# Patient Record
Sex: Female | Born: 1965
Health system: Southern US, Community
[De-identification: ages and names within clinical notes are randomized; demographics above are authoritative.]

## PROBLEM LIST (undated history)

## (undated) DIAGNOSIS — I10 Essential (primary) hypertension: Secondary | ICD-10-CM

## (undated) DIAGNOSIS — R112 Nausea with vomiting, unspecified: Secondary | ICD-10-CM

## (undated) DIAGNOSIS — S060X0A Concussion without loss of consciousness, initial encounter: Secondary | ICD-10-CM

## (undated) DIAGNOSIS — K219 Gastro-esophageal reflux disease without esophagitis: Secondary | ICD-10-CM

## (undated) DIAGNOSIS — F418 Other specified anxiety disorders: Secondary | ICD-10-CM

## (undated) DIAGNOSIS — I471 Supraventricular tachycardia, unspecified: Secondary | ICD-10-CM

## (undated) DIAGNOSIS — M1990 Unspecified osteoarthritis, unspecified site: Secondary | ICD-10-CM

## (undated) DIAGNOSIS — B353 Tinea pedis: Secondary | ICD-10-CM

## (undated) DIAGNOSIS — R51 Headache: Secondary | ICD-10-CM

## (undated) DIAGNOSIS — E782 Mixed hyperlipidemia: Secondary | ICD-10-CM

## (undated) DIAGNOSIS — C44310 Basal cell carcinoma of skin of unspecified parts of face: Secondary | ICD-10-CM

## (undated) DIAGNOSIS — Z9889 Other specified postprocedural states: Secondary | ICD-10-CM

## (undated) DIAGNOSIS — E119 Type 2 diabetes mellitus without complications: Secondary | ICD-10-CM

## (undated) DIAGNOSIS — E669 Obesity, unspecified: Secondary | ICD-10-CM

## (undated) DIAGNOSIS — E039 Hypothyroidism, unspecified: Secondary | ICD-10-CM

## (undated) HISTORY — PX: BASAL CELL CARCINOMA EXCISION: SHX1214

## (undated) HISTORY — DX: Tinea pedis: B35.3

## (undated) HISTORY — DX: Hypothyroidism, unspecified: E03.9

## (undated) HISTORY — DX: Basal cell carcinoma of skin of unspecified parts of face: C44.310

## (undated) HISTORY — PX: FINGER SURGERY: SHX640

## (undated) HISTORY — PX: TONSILLECTOMY: SUR1361

## (undated) HISTORY — DX: Concussion without loss of consciousness, initial encounter: S06.0X0A

## (undated) HISTORY — PX: TOTAL HIP ARTHROPLASTY: SHX124

## (undated) HISTORY — DX: Supraventricular tachycardia: I47.1

## (undated) HISTORY — DX: Mixed hyperlipidemia: E78.2

## (undated) HISTORY — DX: Supraventricular tachycardia, unspecified: I47.10

## (undated) HISTORY — DX: Other specified postprocedural states: R11.2

## (undated) HISTORY — DX: Other specified postprocedural states: Z98.890

## (undated) HISTORY — PX: COLONOSCOPY: SHX174

## (undated) HISTORY — PX: UPPER GI ENDOSCOPY: SHX6162

## (undated) HISTORY — DX: Other specified anxiety disorders: F41.8

## (undated) HISTORY — DX: Unspecified osteoarthritis, unspecified site: M19.90

## (undated) HISTORY — PX: HAND SURGERY: SHX662

## (undated) HISTORY — DX: Obesity, unspecified: E66.9

## (undated) HISTORY — PX: POLYPECTOMY: SHX149

## (undated) HISTORY — DX: Type 2 diabetes mellitus without complications: E11.9

---

## 2004-11-02 ENCOUNTER — Emergency Department (HOSPITAL_COMMUNITY): Admission: EM | Admit: 2004-11-02 | Discharge: 2004-11-02 | Payer: Self-pay | Admitting: Emergency Medicine

## 2005-06-04 HISTORY — PX: DILATION AND CURETTAGE OF UTERUS: SHX78

## 2006-10-11 ENCOUNTER — Emergency Department (HOSPITAL_COMMUNITY): Admission: EM | Admit: 2006-10-11 | Discharge: 2006-10-11 | Payer: Self-pay | Admitting: Emergency Medicine

## 2007-01-21 ENCOUNTER — Ambulatory Visit (HOSPITAL_COMMUNITY): Admission: RE | Admit: 2007-01-21 | Discharge: 2007-01-21 | Payer: Self-pay | Admitting: Obstetrics and Gynecology

## 2007-01-21 ENCOUNTER — Encounter (HOSPITAL_COMMUNITY): Payer: Self-pay | Admitting: Obstetrics and Gynecology

## 2008-10-17 ENCOUNTER — Ambulatory Visit: Payer: Self-pay | Admitting: Family Medicine

## 2008-10-17 DIAGNOSIS — R079 Chest pain, unspecified: Secondary | ICD-10-CM | POA: Insufficient documentation

## 2008-10-17 LAB — CONVERTED CEMR LAB
Blood in Urine, dipstick: NEGATIVE
Glucose, Urine, Semiquant: NEGATIVE
Ketones, urine, test strip: NEGATIVE
Nitrite: NEGATIVE
Protein, U semiquant: NEGATIVE
Specific Gravity, Urine: 1.015

## 2009-03-15 ENCOUNTER — Ambulatory Visit (HOSPITAL_BASED_OUTPATIENT_CLINIC_OR_DEPARTMENT_OTHER): Admission: RE | Admit: 2009-03-15 | Discharge: 2009-03-15 | Payer: Self-pay | Admitting: Family Medicine

## 2009-03-15 ENCOUNTER — Ambulatory Visit: Payer: Self-pay | Admitting: Diagnostic Radiology

## 2010-05-05 ENCOUNTER — Emergency Department (HOSPITAL_COMMUNITY)
Admission: EM | Admit: 2010-05-05 | Discharge: 2010-05-05 | Payer: Self-pay | Source: Home / Self Care | Admitting: Emergency Medicine

## 2010-06-04 HISTORY — PX: SHOULDER ARTHROSCOPY: SHX128

## 2010-07-29 ENCOUNTER — Inpatient Hospital Stay (HOSPITAL_COMMUNITY)
Admission: AD | Admit: 2010-07-29 | Discharge: 2010-07-29 | Disposition: A | Discharge status: Home / Self Care | Payer: 59 | Source: Ambulatory Visit | Attending: Obstetrics and Gynecology | Admitting: Obstetrics and Gynecology

## 2010-07-29 DIAGNOSIS — N949 Unspecified condition associated with female genital organs and menstrual cycle: Secondary | ICD-10-CM | POA: Insufficient documentation

## 2010-07-29 DIAGNOSIS — N938 Other specified abnormal uterine and vaginal bleeding: Secondary | ICD-10-CM | POA: Insufficient documentation

## 2010-07-29 LAB — CBC
MCH: 28.8 pg (ref 26.0–34.0)
MCHC: 34.5 g/dL (ref 30.0–36.0)
MCV: 83.5 fL (ref 78.0–100.0)
Platelets: 265 10*3/uL (ref 150–400)
RDW: 12.9 % (ref 11.5–15.5)

## 2010-10-17 NOTE — Op Note (Signed)
[name], [name] NO.:  [id number]   MEDICAL RECORD NO.:  [id number]          PATIENT TYPE:  AMB   LOCATION:  SDC                           FACILITY:  [facility]   PHYSICIAN:  Zelphia Cairo, MD    DATE OF BIRTH:  May 22, 1966   DATE OF PROCEDURE:  01/21/2007  DATE OF DISCHARGE:                               OPERATIVE REPORT   PREOPERATIVE DIAGNOSIS:  Missed AB.   POSTOPERATIVE DIAGNOSIS:  Missed AB.   PROCEDURE:  1. Suction D&E.  2. Cervical block.   SURGEON:  Dr. Renaldo Fiddler   ASSISTANT:  None.   ANESTHESIA:  General and local.   SPECIMEN:  Products of conception.   DISPOSITION:  Pathology.   COMPLICATIONS:  None.   CONDITION:  Stable to recovery room.   PROCEDURE:  The patient was taken to the operating room where general  anesthesia was obtained. She was placed in the dorsal lithotomy position  using Allen stirrups.  She was prepped and draped in sterile fashion and  sterile catheter was used to drain her bladder for approximately 75 mL  of clear urine. A bivalve speculum was placed in the vagina and a single-  tooth tenaculum was placed on the anterior lip of the cervix.  The  cervix was gently dilated using serial Pratt dilators. 7-French suction  catheter was inserted into the uterine cavity and products of conception  were removed.  A curette was then used to ensure that all products of  conception had been removed. Once the uterine cry was noted throughout  the uterus the suction catheter was reinserted to remove any clots and  debris. The cervix was then injected with 1% Nesacaine. Single-tooth  tenaculum was removed from the cervix. There was some  oozing from the anterior cervical site and pressure was applied using a  ring forceps. Once hemostasis was assured.  Bivalve speculum was removed  from the vagina and the patient was taken to the recovery room in stable  condition. Sponge lap, needle and instrument counts were correct x2.  She will be  given RhoGAM in PACU.      Zelphia Cairo, MD  Electronically Signed     GA/MEDQ  D:  01/21/2007  T:  01/21/2007  Job:  [id number]

## 2010-12-27 ENCOUNTER — Other Ambulatory Visit: Payer: Self-pay | Admitting: Obstetrics and Gynecology

## 2010-12-29 ENCOUNTER — Encounter (HOSPITAL_COMMUNITY): Payer: Self-pay

## 2010-12-29 ENCOUNTER — Encounter (HOSPITAL_COMMUNITY)
Admission: RE | Admit: 2010-12-29 | Discharge: 2010-12-29 | Disposition: A | Discharge status: Home / Self Care | Payer: 59 | Source: Ambulatory Visit | Attending: Obstetrics and Gynecology | Admitting: Obstetrics and Gynecology

## 2010-12-29 HISTORY — DX: Gastro-esophageal reflux disease without esophagitis: K21.9

## 2010-12-29 HISTORY — DX: Essential (primary) hypertension: I10

## 2010-12-29 LAB — SURGICAL PCR SCREEN: Staphylococcus aureus: NEGATIVE

## 2010-12-29 LAB — CBC
MCH: 28.6 pg (ref 26.0–34.0)
MCHC: 33.8 g/dL (ref 30.0–36.0)
RBC: 4.65 MIL/uL (ref 3.87–5.11)

## 2010-12-29 NOTE — Anesthesia Preprocedure Evaluation (Addendum)
Anesthesia Evaluation  Name, MR# and DOB Patient awake  General Assessment Comment  Reviewed: Allergy & Precautions, H&P , Patient's Chart, lab work & pertinent test results and reviewed documented beta blocker date and time   History of Anesthesia Complications (+) PONV  Airway Mallampati: I TM Distance: >3 FB Neck ROM: full    Dental No notable dental hx (+) Teeth Intact   Pulmonaryneg pulmonary ROS    clear to auscultation  pulmonary exam normal   Cardiovascular Exercise Tolerance: Good hypertension (just this week has had elev pressures at Dr"s appts), regular Normal   Neuro/Psych (+) {AN ROS/MED HX NEURO HEADACHES (+) PSYCHIATRIC DISORDERS, Depression, Negative Neurological ROS Negative Psych ROS  GI/Hepatic/Renal negative GI ROS, negative Liver ROS, and negative Renal ROS (+)  GERD Medicated and Poorly Controlled     Endo/Other  Negative Endocrine ROS (+)  Hypothyroidism,  Abdominal   Musculoskeletal negative musculoskeletal ROS (+)  Hematology negative hematology ROS (+)   Peds  Reproductive/Obstetrics negative OB ROS   Anesthesia Other Findings Patient to go see PCP for evaluation of high blood pressure - has had diastolics greater than 100 on several occasions this week.  Jasmine December, MD            Anesthesia Physical Anesthesia Plan  ASA: II and Emergent  Anesthesia Plan: General   Post-op Pain Management:    Induction: Rapid sequence, Cricoid pressure planned and Intravenous  Airway Management Planned: Oral ETT  Additional Equipment:   Intra-op Plan:   Post-operative Plan:   Informed Consent: I have reviewed the patients History and Physical, chart, labs and discussed the procedure including the risks, benefits and alternatives for the proposed anesthesia with the patient or authorized representative who has indicated his/her understanding and acceptance.   Dental Advisory Given  Plan  Discussed with: CRNA and Surgeon  Anesthesia Plan Comments:        Anesthesia Quick Evaluation

## 2010-12-29 NOTE — Patient Instructions (Addendum)
[address] Grein Hawks  12/29/2010   Your procedure is scheduled on:  01/03/2011  Report to [facility] at 600 AM.  Call this number if you have problems the morning of surgery: [phone]   Remember:   Do not eat food:After Midnight.  Do not drink clear liquids: After Midnight.  Take these medicines the morning of surgery with A SIP OF WATER: NA   Do not wear jewelry, make-up or nail polish.  Do not bring valuables to the hospital.  Contacts, dentures or bridgework may not be worn into surgery.  Leave suitcase in the car. After surgery it may be brought to your room.  For patients admitted to the hospital, checkout time is 11:00 AM the day of discharge.   Patients discharged the day of surgery will not be allowed to drive home.  Name and phone number of your driver: Janeece Riggers   [phone]  Special Instructions: CHG bath 1/2 bottle the PM before surgery and 1/2bottle AM of surgery Please read over the following fact sheets that you were given: MRSA

## 2011-01-02 MED ORDER — DEXTROSE 5 % IV SOLN
1.0000 g | INTRAVENOUS | Status: DC
  Filled 2011-01-02: qty 1

## 2011-01-02 NOTE — H&P (Addendum)
NAMEJENNYFER, Ebony [name] NO.:  [id number]  MEDICAL RECORD NO.:  [id number]  LOCATION:  SDC                           FACILITY:  [facility]  PHYSICIAN:  Zelphia Cairo, MD    DATE OF BIRTH:  03-31-1966  DATE OF ADMISSION:  12/29/2010 DATE OF DISCHARGE:  12/29/2010                             HISTORY & PHYSICAL   A 45 year old who desires permanent sterilization with a history of menorrhagia presents today for surgical management.  PAST MEDICAL HISTORY:  Hypothyroidism and depression/anxiety.  SURGICAL HISTORY:  Cesarean section.  SOCIAL HISTORY:  Negative for tobacco, alcohol, and drug use.  ALLERGIES:  None.  MEDICATIONS: 1. Pristiq. 2. Synthroid.  FAMILY HISTORY:  Noncontributory.  PHYSICAL EXAMINATION:  VITAL SIGNS:  Stable. GENERAL:  She is in no acute distress. HEART:  Regular rate and rhythm. LUNGS:  Clear bilaterally. ABDOMEN:  Soft, nontender, nondistended.  No rebound or guarding. PELVIC:  Without masses or tenderness.  Pelvic ultrasound was normal. Endometrial biopsy was benign.  ASSESSMENT: 1. Desires permanent sterilization. 2. Menorrhagia.  PLAN: 1. Laparoscopic __________. 2. Hysteroscopy D and C with NovaSure ablation. 3. Risks, benefits, and alternatives to the procedure were discussed     with [name] and informed consent was obtained.   1. Laparoscopic BTL  Zelphia Cairo, MD     GA/MEDQ  D:  01/02/2011  T:  01/02/2011  Job:  [id number]

## 2011-01-03 ENCOUNTER — Other Ambulatory Visit: Payer: Self-pay | Admitting: Obstetrics and Gynecology

## 2011-01-03 ENCOUNTER — Ambulatory Visit (HOSPITAL_COMMUNITY)
Admission: RE | Admit: 2011-01-03 | Discharge: 2011-01-03 | Disposition: A | Discharge status: Home / Self Care | Payer: 59 | Source: Ambulatory Visit | Attending: Obstetrics and Gynecology | Admitting: Obstetrics and Gynecology

## 2011-01-03 ENCOUNTER — Encounter (HOSPITAL_COMMUNITY)
Admission: RE | Disposition: A | Discharge status: Home / Self Care | Payer: Self-pay | Source: Ambulatory Visit | Attending: Obstetrics and Gynecology

## 2011-01-03 ENCOUNTER — Encounter (HOSPITAL_COMMUNITY): Payer: Self-pay | Admitting: Anesthesiology

## 2011-01-03 ENCOUNTER — Ambulatory Visit (HOSPITAL_COMMUNITY): Payer: 59 | Admitting: Anesthesiology

## 2011-01-03 DIAGNOSIS — Z01812 Encounter for preprocedural laboratory examination: Secondary | ICD-10-CM | POA: Insufficient documentation

## 2011-01-03 DIAGNOSIS — Z302 Encounter for sterilization: Secondary | ICD-10-CM | POA: Insufficient documentation

## 2011-01-03 DIAGNOSIS — Z01818 Encounter for other preprocedural examination: Secondary | ICD-10-CM | POA: Insufficient documentation

## 2011-01-03 DIAGNOSIS — N920 Excessive and frequent menstruation with regular cycle: Secondary | ICD-10-CM | POA: Insufficient documentation

## 2011-01-03 HISTORY — PX: LAPAROSCOPIC TUBAL LIGATION: SHX1937

## 2011-01-03 LAB — TYPE AND SCREEN: Antibody Screen: NEGATIVE

## 2011-01-03 SURGERY — LIGATION, FALLOPIAN TUBE, LAPAROSCOPIC
Anesthesia: General | Wound class: Clean

## 2011-01-03 MED ORDER — CITRIC ACID-SODIUM CITRATE 334-500 MG/5ML PO SOLN: 30.0000 mL | Freq: Once | ORAL | Status: DC | PRN

## 2011-01-03 MED ORDER — ACETAMINOPHEN 325 MG PO TABS: 325.0000 mg | ORAL_TABLET | ORAL | Status: DC | PRN

## 2011-01-03 MED ORDER — FENTANYL CITRATE 0.05 MG/ML IJ SOLN
INTRAMUSCULAR | Status: AC
  Filled 2011-01-03: qty 5

## 2011-01-03 MED ORDER — HYDROMORPHONE HCL 1 MG/ML IJ SOLN
0.2500 mg | INTRAMUSCULAR | Status: DC | PRN
  Administered 2011-01-03 (×2): 0.5 mg via INTRAVENOUS

## 2011-01-03 MED ORDER — MEPERIDINE HCL 25 MG/ML IJ SOLN
6.2500 mg | INTRAMUSCULAR | Status: DC | PRN
  Administered 2011-01-03 (×2): 6.25 mg via INTRAVENOUS

## 2011-01-03 MED ORDER — LACTATED RINGERS IV SOLN
INTRAVENOUS | Status: DC | PRN
  Administered 2011-01-03: 3000 mL via INTRAUTERINE

## 2011-01-03 MED ORDER — MIDAZOLAM HCL 2 MG/2ML IJ SOLN
INTRAMUSCULAR | Status: AC
  Filled 2011-01-03: qty 2

## 2011-01-03 MED ORDER — MIDAZOLAM HCL 5 MG/5ML IJ SOLN
INTRAMUSCULAR | Status: DC | PRN
  Administered 2011-01-03: 2 mg via INTRAVENOUS

## 2011-01-03 MED ORDER — ONDANSETRON HCL 4 MG/2ML IJ SOLN
4.0000 mg | Freq: Once | INTRAMUSCULAR | Status: AC
  Administered 2011-01-03: 4 mg via INTRAVENOUS

## 2011-01-03 MED ORDER — MIDAZOLAM HCL 2 MG/2ML IJ SOLN: 0.5000 mg | INTRAMUSCULAR | Status: DC | PRN

## 2011-01-03 MED ORDER — FAMOTIDINE 20 MG PO TABS: 20.0000 mg | ORAL_TABLET | Freq: Once | ORAL | Status: DC | PRN

## 2011-01-03 MED ORDER — ONDANSETRON HCL 4 MG/2ML IJ SOLN
INTRAMUSCULAR | Status: AC
  Administered 2011-01-03: 4 mg via INTRAVENOUS
  Filled 2011-01-03: qty 2

## 2011-01-03 MED ORDER — PROMETHAZINE HCL 25 MG/ML IJ SOLN: 6.2500 mg | INTRAMUSCULAR | Status: DC | PRN

## 2011-01-03 MED ORDER — ACETAMINOPHEN 10 MG/ML IV SOLN: 1000.0000 mg | Freq: Once | INTRAVENOUS | Status: DC | PRN

## 2011-01-03 MED ORDER — ROCURONIUM BROMIDE 100 MG/10ML IV SOLN
INTRAVENOUS | Status: DC | PRN
  Administered 2011-01-03: 40 mg via INTRAVENOUS

## 2011-01-03 MED ORDER — LACTATED RINGERS IV SOLN
INTRAVENOUS | Status: DC
Start: 2011-01-03 — End: 2011-01-03
  Administered 2011-01-03 (×2): via INTRAVENOUS

## 2011-01-03 MED ORDER — PROPOFOL 10 MG/ML IV EMUL
INTRAVENOUS | Status: DC | PRN
  Administered 2011-01-03: 200 mg via INTRAVENOUS

## 2011-01-03 MED ORDER — HYDROCODONE-ACETAMINOPHEN 5-325 MG PO TABS: 2.0000 | ORAL_TABLET | Freq: Once | ORAL | Status: DC

## 2011-01-03 MED ORDER — PANTOPRAZOLE SODIUM 40 MG PO TBEC: 40.0000 mg | DELAYED_RELEASE_TABLET | Freq: Once | ORAL | Status: DC | PRN

## 2011-01-03 MED ORDER — NEOSTIGMINE METHYLSULFATE 1 MG/ML IJ SOLN
INTRAMUSCULAR | Status: DC | PRN
  Administered 2011-01-03: 2 mg via INTRAMUSCULAR

## 2011-01-03 MED ORDER — GLYCOPYRROLATE 0.2 MG/ML IJ SOLN
INTRAMUSCULAR | Status: DC | PRN
  Administered 2011-01-03: 0.2 mg via INTRAVENOUS
  Administered 2011-01-03: .6 mg via INTRAVENOUS

## 2011-01-03 MED ORDER — HYDROMORPHONE HCL 1 MG/ML IJ SOLN
INTRAMUSCULAR | Status: AC
  Administered 2011-01-03: 0.5 mg via INTRAVENOUS
  Filled 2011-01-03: qty 1

## 2011-01-03 MED ORDER — BUPIVACAINE HCL (PF) 0.25 % IJ SOLN
INTRAMUSCULAR | Status: DC | PRN
  Administered 2011-01-03: 5 mL

## 2011-01-03 MED ORDER — ONDANSETRON HCL 4 MG/2ML IJ SOLN
INTRAMUSCULAR | Status: DC | PRN
  Administered 2011-01-03: 4 mg via INTRAVENOUS

## 2011-01-03 MED ORDER — SCOPOLAMINE 1 MG/3DAYS TD PT72
1.0000 | MEDICATED_PATCH | Freq: Once | TRANSDERMAL | Status: DC
  Administered 2011-01-03: 1.5 mg via TRANSDERMAL

## 2011-01-03 MED ORDER — LIDOCAINE HCL (CARDIAC) 20 MG/ML IV SOLN
INTRAVENOUS | Status: DC | PRN
  Administered 2011-01-03: 80 mg via INTRAVENOUS

## 2011-01-03 MED ORDER — SCOPOLAMINE 1 MG/3DAYS TD PT72
MEDICATED_PATCH | TRANSDERMAL | Status: AC
  Administered 2011-01-03: 1.5 mg via TRANSDERMAL
  Filled 2011-01-03: qty 1

## 2011-01-03 MED ORDER — MEPERIDINE HCL 25 MG/ML IJ SOLN
INTRAMUSCULAR | Status: AC
  Administered 2011-01-03: 6.25 mg via INTRAVENOUS
  Filled 2011-01-03: qty 1

## 2011-01-03 MED ORDER — HYDROCODONE-ACETAMINOPHEN 5-500 MG PO TABS: 1.0000 | ORAL_TABLET | Freq: Four times a day (QID) | ORAL | Status: AC | PRN

## 2011-01-03 MED ORDER — FENTANYL CITRATE 0.05 MG/ML IJ SOLN
INTRAMUSCULAR | Status: DC | PRN
  Administered 2011-01-03 (×2): 100 ug via INTRAVENOUS
  Administered 2011-01-03: 50 ug via INTRAVENOUS

## 2011-01-03 MED ORDER — LIDOCAINE HCL 1 % IJ SOLN
INTRAMUSCULAR | Status: DC | PRN
  Administered 2011-01-03: 9 mL

## 2011-01-03 MED ORDER — KETOROLAC TROMETHAMINE 30 MG/ML IJ SOLN
INTRAMUSCULAR | Status: DC | PRN
  Administered 2011-01-03: 30 mg via INTRAVENOUS

## 2011-01-03 MED ORDER — DEXAMETHASONE SODIUM PHOSPHATE 4 MG/ML IJ SOLN
INTRAMUSCULAR | Status: DC | PRN
  Administered 2011-01-03: 10 mg via INTRAVENOUS

## 2011-01-03 SURGICAL SUPPLY — 26 items
ABLATOR ENDOMETRIAL BIPOLAR (ABLATOR) ×3 IMPLANT
CATH ROBINSON RED A/P 16FR (CATHETERS) ×3 IMPLANT
CHLORAPREP W/TINT 26ML (MISCELLANEOUS) ×6 IMPLANT
CLIP FILSHIE TUBAL LIGA STRL (Clip) ×1 IMPLANT
CLOTH BEACON ORANGE TIMEOUT ST (SAFETY) ×3 IMPLANT
CONTAINER PREFILL 10% NBF 60ML (FORM) IMPLANT
DERMABOND ADVANCED (GAUZE/BANDAGES/DRESSINGS) ×4 IMPLANT
GLOVE BIO SURGEON STRL SZ 6.5 (GLOVE) ×3 IMPLANT
GLOVE BIOGEL PI IND STRL 7.0 (GLOVE) ×4 IMPLANT
GLOVE BIOGEL PI INDICATOR 7.0 (GLOVE) ×2
GOWN PREVENTION PLUS LG XLONG (DISPOSABLE) ×6 IMPLANT
NDL SPNL 22GX3.5 QUINCKE BK (NEEDLE) ×2 IMPLANT
NEEDLE SPNL 22GX3.5 QUINCKE BK (NEEDLE) ×3 IMPLANT
PACK HYSTEROSCOPY LF (CUSTOM PROCEDURE TRAY) ×3 IMPLANT
PACK LAPAROSCOPY BASIN (CUSTOM PROCEDURE TRAY) ×3 IMPLANT
SET IRRIG TUBING LAPAROSCOPIC (IRRIGATION / IRRIGATOR) IMPLANT
SLEEVE Z-THREAD 5X100MM (TROCAR) IMPLANT
SUT VIC AB 3-0 PS2 18 (SUTURE) ×3
SUT VIC AB 3-0 PS2 18XBRD (SUTURE) ×2 IMPLANT
SUT VICRYL 0 UR6 27IN ABS (SUTURE) ×3 IMPLANT
SYR CONTROL 10ML LL (SYRINGE) ×3 IMPLANT
TOWEL OR 17X24 6PK STRL BLUE (TOWEL DISPOSABLE) ×6 IMPLANT
TROCAR Z-THREAD BLADED 5X100MM (TROCAR) IMPLANT
TROCAR Z-THREAD FIOS 11X100 BL (TROCAR) ×3 IMPLANT
WARMER LAPAROSCOPE (MISCELLANEOUS) ×3 IMPLANT
WATER STERILE IRR 1000ML POUR (IV SOLUTION) ×3 IMPLANT

## 2011-01-03 NOTE — Anesthesia Postprocedure Evaluation (Signed)
  Anesthesia Post-op Note  Patient: Ebony [name]  Procedure(s) Performed:  LAPAROSCOPIC TUBAL LIGATION - with filshie clips; DILATATION & CURETTAGE/HYSTEROSCOPY WITH NOVASURE ABLATION Patient is awake and responsive. Pain and nausea are reasonably well controlled. Vital signs are stable and clinically acceptable. Oxygen saturation is clinically acceptable. There are no apparent anesthetic complications at this time. Patient is ready for discharge.

## 2011-01-03 NOTE — Transfer of Care (Signed)
Immediate Anesthesia Transfer of Care Note  Patient: Ebony [name]  Procedure(s) Performed:  LAPAROSCOPIC TUBAL LIGATION - with filshie clips; DILATATION & CURETTAGE/HYSTEROSCOPY WITH NOVASURE ABLATION  Patient Location: PACU  Anesthesia Type: General  Level of Consciousness: awake and sedated  Airway & Oxygen Therapy: Patient Spontanous Breathing and Patient connected to face mask oxygen  Post-op Assessment: Report given to PACU RN and Post -op Vital signs reviewed and stable  Post vital signs: Reviewed and stable  Complications: No apparent anesthesia complications

## 2011-01-03 NOTE — Op Note (Signed)
01/03/2011  8:14 AM  PATIENT:  Ebony [name]  45 y.o. female  PRE-OPERATIVE DIAGNOSIS:  menorrhagia, desires sterilization  POST-OPERATIVE DIAGNOSIS:  mennorrhagia  , desires sterilization  PROCEDURE:  Procedure(s): LAPAROSCOPIC TUBAL LIGATION DILATATION & CURETTAGE HYSTEROSCOPY NOVASURE ABLATION CERVICAL BLOCK  SURGEON:  Surgeon(s): Zelphia Cairo  ANESTHESIA:   local and general  ESTIMATED BLOOD LOSS: ,<100CC   LOCAL MEDICATIONS USED:  XYLOCAINE  SPECIMEN: EMC  FLUID DEFICIT:  50CC   DISPOSITION OF SPECIMEN:  PATHOLOGY  COUNTS:  YES  DICTATION #: [id number]  PATIENT DISPOSITION:  PACU - hemodynamically stable.   Delay start of Pharmacological VTE agent (>24hrs) due to surgical blood loss or risk of bleeding:  no

## 2011-01-03 NOTE — Op Note (Signed)
[name], [name]:  [id number]  MEDICAL RECORD NO.:  [id number]  LOCATION:  [facility]                          [facility]ACILITY:  [facility]  PHYSICIAN:  Zelphia Cairo, MD    DATE OF BIRTH:  02/02/1966  DATE OF PROCEDURE:  01/03/2011 DATE OF DISCHARGE:                              OPERATIVE REPORT   PREOPERATIVE DIAGNOSES: 1. Menorrhagia 2. Desires permanent sterilization.  POSTOPERATIVE DIAGNOSES: 1. Menorrhagia 2. Desires permanent sterilization.  PROCEDURES: 1. Laparoscopic bilateral tubal ligation with Filshie clips. 2. Hysteroscopy. 3. D and C. 4. NovaSure ablation. 5. Cervical block.  SURGEON:  Zelphia Cairo, MD  ANESTHESIA:  Local and general.  BLOOD LOSS:  Less than 100 mL.  SPECIMEN:  Endometrial curettings.  FLUID DEFICIT:  50 mL.  COMPLICATIONS:  None.  CONDITION:  Stable to recovery room.  PROCEDURE IN DETAIL:  Marcedes was taken to the operating room after informed consent was obtained.  Time out was performed, and she was prepped and draped in sterile fashion.  An in-and-out catheter was used to drain her bladder for less than 50 mL of clear urine.  Bivalve speculum was placed in the vagina and a single-tooth tenaculum was placed on the anterior lip of the cervix.  Hulka clamp was placed on the cervix.  Tenaculum and speculum were removed and our attention was turned to the abdomen.  A small infraumbilical skin incision was made with a scalpel.  This was extended bluntly to the level of the fascia using a Kelly clamp. Optical trocar was then inserted under direct visualization.  Once intraperitoneal placement was confirmed, CO2 was turned on and the abdomen and pelvis were insufflated.  A survey of the abdomen and pelvis revealed a normal right upper quadrant.  Bilateral ovaries, fallopian tubes appeared normal.  The uterus appeared mildly enlarged.  She did have a small amount of adhesions that were light and filmy in the left lower  quadrant.  Bilateral fallopian tubes were identified and Filshie clips were placed at the isthmic portion of the fallopian tube laparoscopically.  Once this was complete, all instruments and trocars were removed from the abdomen.  A deep stitch was placed in the infraumbilical skin incision using Vicryl and the skin was reapproximated with Dermabond.  Our attention was then turned to the vagina.  Hulka clamp was removed and the bivalve speculum was placed in the vagina.  1 mL of Xylocaine was placed at 12 o'clock and a single-tooth tenaculum was placed on the anterior lip of the cervix.  A cervical block was then performed using 8 mL of local anesthesia.  The uterus measured 8 cm, the cervix measured 4 cm, and the cervix was then serially dilated using Pratt dilators.  Diagnostic hysteroscope was inserted.  Bilateral ostia were visualized and appeared normal.  The uterine cavity was without masses.  Gentle curetting was then performed. Specimen was placed on Telfa and passed off to be sent to Pathology and NovaSure ablation was then performed using standard manufacture guidelines.  Once the cycle was complete, the ablation device was removed from the uterus.  Tenaculum was removed.  The cervix was hemostatic.  Speculum was removed.  The patient was taken  to the recovery room in stable condition.  Sponge, lap, needle, and instrument counts were correct x2.     Zelphia Cairo, MD     GA/MEDQ  D:  01/03/2011  T:  01/03/2011  Job:  [id number]

## 2011-01-04 ENCOUNTER — Encounter (HOSPITAL_COMMUNITY): Payer: Self-pay | Admitting: Obstetrics and Gynecology

## 2011-03-16 LAB — CBC
HCT: 38.4
Hemoglobin: 13.6
MCHC: 35.5
MCV: 84.5
RBC: 4.55
RDW: 12.7

## 2011-03-16 LAB — RH IMMUNE GLOBULIN WORKUP (NOT WOMEN'S HOSP): ABO/RH(D): B NEG

## 2012-02-21 ENCOUNTER — Encounter (HOSPITAL_COMMUNITY): Payer: Self-pay | Admitting: Pharmacist

## 2012-02-26 ENCOUNTER — Other Ambulatory Visit (HOSPITAL_COMMUNITY): Payer: 59

## 2012-02-26 ENCOUNTER — Encounter (HOSPITAL_COMMUNITY)
Admission: RE | Admit: 2012-02-26 | Discharge: 2012-02-26 | Disposition: A | Discharge status: Home / Self Care | Payer: 59 | Source: Ambulatory Visit | Attending: Obstetrics and Gynecology | Admitting: Obstetrics and Gynecology

## 2012-02-26 ENCOUNTER — Encounter (HOSPITAL_COMMUNITY): Payer: Self-pay

## 2012-02-26 HISTORY — DX: Headache: R51

## 2012-02-26 LAB — SURGICAL PCR SCREEN
MRSA, PCR: NEGATIVE
Staphylococcus aureus: NEGATIVE

## 2012-02-26 LAB — CBC
Hemoglobin: 13.4 g/dL (ref 12.0–15.0)
MCH: 28 pg (ref 26.0–34.0)
Platelets: 312 10*3/uL (ref 150–400)
RBC: 4.78 MIL/uL (ref 3.87–5.11)

## 2012-02-26 NOTE — Patient Instructions (Addendum)
   Your procedure is scheduled on: Tuesday, Oct 1st  Enter through the Main Entrance of [facility] at: 6 am Pick up the phone at the desk and dial [phone] and inform us of your arrival.  Please call this number if you have any problems the morning of surgery: [phone]  Remember: Do not eat food after midnight: Monday Do not drink clear liquids after: Monday Take these medicines the morning of surgery with a SIP OF WATER: SYNTHROID  Do not wear jewelry, make-up, or FINGER nail polish No metal in your hair or on your body. Do not wear lotions, powders, perfumes or deodorant. Do not shave 48 hours prior to surgery. Do not bring valuables to the hospital. Contacts, dentures or bridgework may not be worn into surgery.  Leave suitcase in the car. After Surgery it may be brought to your room. For patients being admitted to the hospital, checkout time is 11:00am the day of discharge. Home with husband [name] cell [phone].  Patients discharged on the day of surgery will not be allowed to drive home.     Remember to use your hibiclens as instructed.Please shower with 1/2 bottle the evening before your surgery and the other 1/2 bottle the morning of surgery. Neck down avoiding private area.

## 2012-02-26 NOTE — Pre-Procedure Instructions (Signed)
Spoke with Dr Cristela Blue regarding patient's hx a-fib.  Patient states prior to C/S in 2002 in [place], she had a cardiac work up which showed occasional a-fib.  Patient states a-fib on rare occasion and she just lays down for a little bit when it occurrs.  Patient does not remember where testing was done in [place] since it has been over ten years. No meds taken for a-fib.  EKG done today at pre-op visit which showed Sinus Huston Foley.  Per Dr Jean Rosenthal, document what patient states since we can not obtain past cardiac history.

## 2012-03-03 MED ORDER — DEXTROSE 5 % IV SOLN
2.0000 g | INTRAVENOUS | Status: AC
  Administered 2012-03-04: 2 g via INTRAVENOUS
  Filled 2012-03-03: qty 2

## 2012-03-03 NOTE — H&P (Addendum)
46yo with severe dysmenorrhea presnets for surgical mngt.  Pt is s/p ablation and reports less menstrual bleeding however continues to have dysmenorrhea.  No relief of sx w/ OCPs.  PMHx:  hypothyroidism PSHx:  c-section, BTL, uterine ablation All:  None Meds:  Synthroid, loloestrin, clexa  FHx:  n/c SHx:  No tobacco  AF, VSS Gen - NAD CV - RRR Lungs - clear Abd - soft, nt/nd PV - uterus mobile, NT.  No adnexal masses  AP:  Dysmenorrhea LAVH R/b/a discussed.  Plan of care reviewed.  Informed consent

## 2012-03-04 ENCOUNTER — Encounter (HOSPITAL_COMMUNITY): Payer: Self-pay | Admitting: Anesthesiology

## 2012-03-04 ENCOUNTER — Encounter (HOSPITAL_COMMUNITY): Payer: Self-pay | Admitting: *Deleted

## 2012-03-04 ENCOUNTER — Encounter (HOSPITAL_COMMUNITY)
Admission: RE | Disposition: A | Discharge status: Home / Self Care | Payer: Self-pay | Source: Ambulatory Visit | Attending: Obstetrics and Gynecology

## 2012-03-04 ENCOUNTER — Ambulatory Visit (HOSPITAL_COMMUNITY): Payer: 59 | Admitting: Anesthesiology

## 2012-03-04 ENCOUNTER — Inpatient Hospital Stay (HOSPITAL_COMMUNITY)
Admission: RE | Admit: 2012-03-04 | Discharge: 2012-03-06 | DRG: 743 | Disposition: A | Discharge status: Home / Self Care | Payer: 59 | Source: Ambulatory Visit | Attending: Obstetrics and Gynecology | Admitting: Obstetrics and Gynecology

## 2012-03-04 DIAGNOSIS — Z5331 Laparoscopic surgical procedure converted to open procedure: Secondary | ICD-10-CM

## 2012-03-04 DIAGNOSIS — N946 Dysmenorrhea, unspecified: Principal | ICD-10-CM | POA: Diagnosis present

## 2012-03-04 DIAGNOSIS — D251 Intramural leiomyoma of uterus: Secondary | ICD-10-CM | POA: Diagnosis present

## 2012-03-04 DIAGNOSIS — I498 Other specified cardiac arrhythmias: Secondary | ICD-10-CM | POA: Diagnosis not present

## 2012-03-04 DIAGNOSIS — E039 Hypothyroidism, unspecified: Secondary | ICD-10-CM | POA: Diagnosis present

## 2012-03-04 DIAGNOSIS — N8000 Endometriosis of the uterus, unspecified: Secondary | ICD-10-CM | POA: Diagnosis present

## 2012-03-04 DIAGNOSIS — N72 Inflammatory disease of cervix uteri: Secondary | ICD-10-CM | POA: Diagnosis present

## 2012-03-04 HISTORY — PX: ABDOMINAL HYSTERECTOMY: SHX81

## 2012-03-04 HISTORY — PX: LAPAROSCOPY: SHX197

## 2012-03-04 LAB — CBC
HCT: 33.6 % — ABNORMAL LOW (ref 36.0–46.0)
MCH: 28.6 pg (ref 26.0–34.0)
MCV: 85.9 fL (ref 78.0–100.0)
Platelets: 265 10*3/uL (ref 150–400)
RBC: 3.91 MIL/uL (ref 3.87–5.11)

## 2012-03-04 LAB — TYPE AND SCREEN: Antibody Screen: NEGATIVE

## 2012-03-04 SURGERY — HYSTERECTOMY, ABDOMINAL
Anesthesia: General | Site: Abdomen | Wound class: Clean Contaminated

## 2012-03-04 MED ORDER — ATROPINE SULFATE 0.4 MG/ML IJ SOLN
INTRAMUSCULAR | Status: AC
  Filled 2012-03-04: qty 1

## 2012-03-04 MED ORDER — ATROPINE SULFATE 0.4 MG/ML IJ SOLN
INTRAMUSCULAR | Status: DC | PRN
Start: 2012-03-04 — End: 2012-03-04
  Administered 2012-03-04: 0.4 mg via INTRAVENOUS

## 2012-03-04 MED ORDER — LEVOTHYROXINE SODIUM 50 MCG PO TABS
50.0000 ug | ORAL_TABLET | ORAL | Status: DC
  Administered 2012-03-05 – 2012-03-06 (×2): 50 ug via ORAL
  Filled 2012-03-04 (×4): qty 1

## 2012-03-04 MED ORDER — MIDAZOLAM HCL 2 MG/2ML IJ SOLN
INTRAMUSCULAR | Status: AC
  Filled 2012-03-04: qty 2

## 2012-03-04 MED ORDER — NEOSTIGMINE METHYLSULFATE 1 MG/ML IJ SOLN
INTRAMUSCULAR | Status: DC | PRN
  Administered 2012-03-04: 3 mg via INTRAVENOUS

## 2012-03-04 MED ORDER — HYDROMORPHONE HCL PF 1 MG/ML IJ SOLN
INTRAMUSCULAR | Status: AC
  Filled 2012-03-04: qty 1

## 2012-03-04 MED ORDER — NEOSTIGMINE METHYLSULFATE 1 MG/ML IJ SOLN
INTRAMUSCULAR | Status: AC
  Filled 2012-03-04: qty 10

## 2012-03-04 MED ORDER — BUPIVACAINE HCL (PF) 0.25 % IJ SOLN
INTRAMUSCULAR | Status: AC
  Filled 2012-03-04: qty 30

## 2012-03-04 MED ORDER — LIDOCAINE HCL (CARDIAC) 20 MG/ML IV SOLN
INTRAVENOUS | Status: DC | PRN
  Administered 2012-03-04: 80 mg via INTRAVENOUS

## 2012-03-04 MED ORDER — CEFAZOLIN SODIUM-DEXTROSE 2-3 GM-% IV SOLR
INTRAVENOUS | Status: AC
  Filled 2012-03-04: qty 50

## 2012-03-04 MED ORDER — FAMOTIDINE 20 MG PO TABS
20.0000 mg | ORAL_TABLET | Freq: Two times a day (BID) | ORAL | Status: DC
  Administered 2012-03-04 – 2012-03-06 (×4): 20 mg via ORAL
  Filled 2012-03-04 (×8): qty 1

## 2012-03-04 MED ORDER — DIPHENHYDRAMINE HCL 50 MG/ML IJ SOLN
12.5000 mg | Freq: Four times a day (QID) | INTRAMUSCULAR | Status: DC | PRN
  Administered 2012-03-04 (×2): 12.5 mg via INTRAVENOUS
  Filled 2012-03-04 (×2): qty 1

## 2012-03-04 MED ORDER — METOCLOPRAMIDE HCL 5 MG/ML IJ SOLN: 10.0000 mg | Freq: Once | INTRAMUSCULAR | Status: DC | PRN

## 2012-03-04 MED ORDER — OXYCODONE-ACETAMINOPHEN 5-325 MG PO TABS
1.0000 | ORAL_TABLET | ORAL | Status: DC | PRN
Start: 2012-03-04 — End: 2012-03-05
  Administered 2012-03-05 (×2): 2 via ORAL
  Filled 2012-03-04 (×2): qty 2

## 2012-03-04 MED ORDER — KETOROLAC TROMETHAMINE 30 MG/ML IJ SOLN
30.0000 mg | Freq: Four times a day (QID) | INTRAMUSCULAR | Status: AC
  Administered 2012-03-04 – 2012-03-05 (×3): 30 mg via INTRAVENOUS
  Filled 2012-03-04 (×3): qty 1

## 2012-03-04 MED ORDER — 0.9 % SODIUM CHLORIDE (POUR BTL) OPTIME
TOPICAL | Status: DC | PRN
  Administered 2012-03-04: 1000 mL

## 2012-03-04 MED ORDER — HYDROMORPHONE HCL PF 1 MG/ML IJ SOLN
INTRAMUSCULAR | Status: DC | PRN
  Administered 2012-03-04 (×2): 1 mg via INTRAVENOUS

## 2012-03-04 MED ORDER — PHENYLEPHRINE HCL 10 MG/ML IJ SOLN
INTRAMUSCULAR | Status: DC | PRN
  Administered 2012-03-04: 40 ug via INTRAVENOUS
  Administered 2012-03-04: 80 ug via INTRAVENOUS

## 2012-03-04 MED ORDER — GLYCOPYRROLATE 0.2 MG/ML IJ SOLN
INTRAMUSCULAR | Status: DC | PRN
Start: 2012-03-04 — End: 2012-03-04
  Administered 2012-03-04: 0.4 mg via INTRAVENOUS

## 2012-03-04 MED ORDER — SCOPOLAMINE 1 MG/3DAYS TD PT72
MEDICATED_PATCH | TRANSDERMAL | Status: AC
  Administered 2012-03-04: 1.5 mg via TRANSDERMAL
  Filled 2012-03-04: qty 1

## 2012-03-04 MED ORDER — INFLUENZA VIRUS VACC SPLIT PF IM SUSP
0.5000 mL | INTRAMUSCULAR | Status: AC
  Administered 2012-03-05: 0.5 mL via INTRAMUSCULAR
  Filled 2012-03-04: qty 0.5

## 2012-03-04 MED ORDER — MIDAZOLAM HCL 5 MG/5ML IJ SOLN
INTRAMUSCULAR | Status: DC | PRN
  Administered 2012-03-04: 2 mg via INTRAVENOUS

## 2012-03-04 MED ORDER — LACTATED RINGERS IV SOLN
INTRAVENOUS | Status: DC
  Administered 2012-03-04: 09:00:00 via INTRAVENOUS
  Administered 2012-03-04: 125 mL/h via INTRAVENOUS
  Administered 2012-03-04: 08:00:00 via INTRAVENOUS

## 2012-03-04 MED ORDER — SCOPOLAMINE 1 MG/3DAYS TD PT72
1.0000 | MEDICATED_PATCH | TRANSDERMAL | Status: DC
  Administered 2012-03-04: 1.5 mg via TRANSDERMAL

## 2012-03-04 MED ORDER — NALOXONE HCL 0.4 MG/ML IJ SOLN: 0.4000 mg | INTRAMUSCULAR | Status: DC | PRN

## 2012-03-04 MED ORDER — FENTANYL CITRATE 0.05 MG/ML IJ SOLN
INTRAMUSCULAR | Status: AC
  Filled 2012-03-04: qty 2

## 2012-03-04 MED ORDER — ONDANSETRON HCL 4 MG PO TABS: 4.0000 mg | ORAL_TABLET | Freq: Four times a day (QID) | ORAL | Status: DC | PRN

## 2012-03-04 MED ORDER — SODIUM CHLORIDE 0.9 % IJ SOLN: 9.0000 mL | INTRAMUSCULAR | Status: DC | PRN

## 2012-03-04 MED ORDER — DEXTROSE IN LACTATED RINGERS 5 % IV SOLN
INTRAVENOUS | Status: DC
  Administered 2012-03-04 – 2012-03-05 (×3): via INTRAVENOUS

## 2012-03-04 MED ORDER — MENTHOL 3 MG MT LOZG
1.0000 | LOZENGE | OROMUCOSAL | Status: DC | PRN
  Filled 2012-03-04: qty 9

## 2012-03-04 MED ORDER — BUPIVACAINE HCL (PF) 0.25 % IJ SOLN
INTRAMUSCULAR | Status: DC | PRN
  Administered 2012-03-04: 5 mL

## 2012-03-04 MED ORDER — PROPOFOL 10 MG/ML IV EMUL
INTRAVENOUS | Status: DC | PRN
  Administered 2012-03-04: 200 mg via INTRAVENOUS

## 2012-03-04 MED ORDER — CITALOPRAM HYDROBROMIDE 20 MG PO TABS
20.0000 mg | ORAL_TABLET | Freq: Every day | ORAL | Status: DC
  Administered 2012-03-05 – 2012-03-06 (×2): 20 mg via ORAL
  Filled 2012-03-04 (×4): qty 1

## 2012-03-04 MED ORDER — ROCURONIUM BROMIDE 100 MG/10ML IV SOLN
INTRAVENOUS | Status: DC | PRN
  Administered 2012-03-04: 20 mg via INTRAVENOUS
  Administered 2012-03-04: 50 mg via INTRAVENOUS

## 2012-03-04 MED ORDER — HYDROMORPHONE 0.3 MG/ML IV SOLN
INTRAVENOUS | Status: DC
  Administered 2012-03-04: 11:00:00 via INTRAVENOUS
  Administered 2012-03-04: 2.59 mg via INTRAVENOUS
  Administered 2012-03-04: 0.5999 mg via INTRAVENOUS
  Administered 2012-03-04: 2.59 mg via INTRAVENOUS
  Administered 2012-03-05: 05:00:00 via INTRAVENOUS
  Administered 2012-03-05: 0.4 mg via INTRAVENOUS
  Administered 2012-03-05: 0.399 mg via INTRAVENOUS
  Filled 2012-03-04 (×2): qty 25

## 2012-03-04 MED ORDER — ONDANSETRON HCL 4 MG/2ML IJ SOLN: 4.0000 mg | Freq: Four times a day (QID) | INTRAMUSCULAR | Status: DC | PRN

## 2012-03-04 MED ORDER — MEPERIDINE HCL 25 MG/ML IJ SOLN: 6.2500 mg | INTRAMUSCULAR | Status: DC | PRN

## 2012-03-04 MED ORDER — FENTANYL CITRATE 0.05 MG/ML IJ SOLN
INTRAMUSCULAR | Status: AC
  Filled 2012-03-04: qty 5

## 2012-03-04 MED ORDER — ROCURONIUM BROMIDE 50 MG/5ML IV SOLN
INTRAVENOUS | Status: AC
  Filled 2012-03-04: qty 1

## 2012-03-04 MED ORDER — GLYCOPYRROLATE 0.2 MG/ML IJ SOLN
INTRAMUSCULAR | Status: AC
  Filled 2012-03-04: qty 1

## 2012-03-04 MED ORDER — ONDANSETRON HCL 4 MG/2ML IJ SOLN
INTRAMUSCULAR | Status: AC
  Filled 2012-03-04: qty 2

## 2012-03-04 MED ORDER — DIPHENHYDRAMINE HCL 12.5 MG/5ML PO ELIX
12.5000 mg | ORAL_SOLUTION | Freq: Four times a day (QID) | ORAL | Status: DC | PRN
  Administered 2012-03-05 (×2): 12.5 mg via ORAL
  Filled 2012-03-04 (×3): qty 5

## 2012-03-04 MED ORDER — ONDANSETRON HCL 4 MG/2ML IJ SOLN
INTRAMUSCULAR | Status: DC | PRN
  Administered 2012-03-04: 4 mg via INTRAVENOUS

## 2012-03-04 MED ORDER — DEXAMETHASONE SODIUM PHOSPHATE 10 MG/ML IJ SOLN
INTRAMUSCULAR | Status: DC | PRN
  Administered 2012-03-04: 10 mg via INTRAVENOUS

## 2012-03-04 MED ORDER — FENTANYL CITRATE 0.05 MG/ML IJ SOLN
INTRAMUSCULAR | Status: DC | PRN
  Administered 2012-03-04: 100 ug via INTRAVENOUS
  Administered 2012-03-04: 50 ug via INTRAVENOUS
  Administered 2012-03-04: 150 ug via INTRAVENOUS
  Administered 2012-03-04: 50 ug via INTRAVENOUS

## 2012-03-04 MED ORDER — HYDROMORPHONE HCL PF 1 MG/ML IJ SOLN
0.2500 mg | INTRAMUSCULAR | Status: DC | PRN
  Administered 2012-03-04 (×2): 0.5 mg via INTRAVENOUS
  Administered 2012-03-04: 0.25 mg via INTRAVENOUS

## 2012-03-04 MED ORDER — ONDANSETRON HCL 4 MG/2ML IJ SOLN
4.0000 mg | Freq: Four times a day (QID) | INTRAMUSCULAR | Status: DC | PRN
  Administered 2012-03-04: 4 mg via INTRAVENOUS
  Filled 2012-03-04: qty 2

## 2012-03-04 MED ORDER — PHENYLEPHRINE 40 MCG/ML (10ML) SYRINGE FOR IV PUSH (FOR BLOOD PRESSURE SUPPORT)
PREFILLED_SYRINGE | INTRAVENOUS | Status: AC
  Filled 2012-03-04: qty 5

## 2012-03-04 SURGICAL SUPPLY — 46 items
ADH SKN CLS APL DERMABOND .7 (GAUZE/BANDAGES/DRESSINGS) ×2
CABLE HIGH FREQUENCY MONO STRZ (ELECTRODE) IMPLANT
CANISTER SUCTION 2500CC (MISCELLANEOUS) ×3 IMPLANT
CHLORAPREP W/TINT 26ML (MISCELLANEOUS) ×6 IMPLANT
CLOTH BEACON ORANGE TIMEOUT ST (SAFETY) ×3 IMPLANT
COVER TABLE BACK 60X90 (DRAPES) ×3 IMPLANT
DECANTER SPIKE VIAL GLASS SM (MISCELLANEOUS) IMPLANT
DERMABOND ADVANCED (GAUZE/BANDAGES/DRESSINGS) ×1
DERMABOND ADVANCED .7 DNX12 (GAUZE/BANDAGES/DRESSINGS) ×3 IMPLANT
DRESSING TELFA 8X3 (GAUZE/BANDAGES/DRESSINGS) ×2 IMPLANT
DRSG COVADERM 4X10 (GAUZE/BANDAGES/DRESSINGS) ×2 IMPLANT
ELECT REM PT RETURN 9FT ADLT (ELECTROSURGICAL) ×3
ELECTRODE REM PT RTRN 9FT ADLT (ELECTROSURGICAL) ×1 IMPLANT
GAUZE SPONGE 4X4 12PLY STRL LF (GAUZE/BANDAGES/DRESSINGS) ×2 IMPLANT
GLOVE BIO SURGEON STRL SZ 6.5 (GLOVE) ×6 IMPLANT
GLOVE BIO SURGEON STRL SZ7 (GLOVE) ×4 IMPLANT
GLOVE BIOGEL PI IND STRL 6.5 (GLOVE) ×2 IMPLANT
GLOVE BIOGEL PI IND STRL 7.0 (GLOVE) ×4 IMPLANT
GLOVE BIOGEL PI INDICATOR 6.5 (GLOVE) ×1
GLOVE BIOGEL PI INDICATOR 7.0 (GLOVE) ×2
GOWN PREVENTION PLUS LG XLONG (DISPOSABLE) ×12 IMPLANT
NS IRRIG 1000ML POUR BTL (IV SOLUTION) ×3 IMPLANT
PACK LAVH (CUSTOM PROCEDURE TRAY) ×3 IMPLANT
PROTECTOR NERVE ULNAR (MISCELLANEOUS) ×3 IMPLANT
SCISSORS LAP 5X45 EPIX DISP (ENDOMECHANICALS) ×2 IMPLANT
SEALER TISSUE G2 CVD JAW 45CM (ENDOMECHANICALS) ×3 IMPLANT
SET IRRIG TUBING LAPAROSCOPIC (IRRIGATION / IRRIGATOR) IMPLANT
SLEEVE Z-THREAD 5X100MM (TROCAR) ×4 IMPLANT
SPONGE LAP 18X18 X RAY DECT (DISPOSABLE) ×6 IMPLANT
STAPLER VISISTAT 35W (STAPLE) ×2 IMPLANT
SUT CHROMIC 0 CT 1 (SUTURE) ×4 IMPLANT
SUT MNCRL 0 MO-4 VIOLET 18 CR (SUTURE) ×6 IMPLANT
SUT MON AB 2-0 CT1 36 (SUTURE) ×3 IMPLANT
SUT MON AB-0 CT1 36 (SUTURE) ×2 IMPLANT
SUT MONOCRYL 0 MO 4 18  CR/8 (SUTURE) ×3
SUT PDS AB 0 CTX 60 (SUTURE) ×2 IMPLANT
SUT VIC AB 3-0 PS2 18 (SUTURE) ×3
SUT VIC AB 3-0 PS2 18XBRD (SUTURE) ×2 IMPLANT
SUT VICRYL 0 TIES 12 18 (SUTURE) ×3 IMPLANT
SUT VICRYL 0 UR6 27IN ABS (SUTURE) ×3 IMPLANT
TOWEL OR 17X24 6PK STRL BLUE (TOWEL DISPOSABLE) ×6 IMPLANT
TRAY FOLEY CATH 14FR (SET/KITS/TRAYS/PACK) ×3 IMPLANT
TROCAR Z-THREAD BLADED 5X100MM (TROCAR) ×3 IMPLANT
TROCAR Z-THREAD FIOS 11X100 BL (TROCAR) ×3 IMPLANT
WARMER LAPAROSCOPE (MISCELLANEOUS) ×3 IMPLANT
WATER STERILE IRR 1000ML POUR (IV SOLUTION) ×3 IMPLANT

## 2012-03-04 NOTE — Progress Notes (Signed)
Day of Surgery Procedure(s) (LRB): HYSTERECTOMY ABDOMINAL (N/A) LAPAROSCOPY DIAGNOSTIC (N/A)  Subjective: Patient reports incisional pain.  Pain controlled with PCA.  Mild nausea, no emesis.  No CP or SOB.    Objective: I have reviewed patient's vital signs, intake and output and medications.  General: alert and cooperative GI: normal findings: soft, non-tender Extremities: extremities normal, atraumatic, no cyanosis or edema Vaginal Bleeding: none Incision  - bandage c/d/i.  No BS  Assessment: s/p Procedure(s) (LRB) with comments: HYSTERECTOMY ABDOMINAL (N/A) - with lysis of adhesions LAPAROSCOPY DIAGNOSTIC (N/A) - with lysis of adhesions: stable  Plan: Encourage ambulation Clear liquids Check CBC toradol   LOS: 0 days    Maley Venezia 03/04/2012, 6:23 PM

## 2012-03-04 NOTE — Transfer of Care (Signed)
Immediate Anesthesia Transfer of Care Note  Patient: Ebony [name]  Procedure(s) Performed: Procedure(s) (LRB) with comments: HYSTERECTOMY ABDOMINAL (N/A) - with lysis of adhesions LAPAROSCOPY DIAGNOSTIC (N/A) - with lysis of adhesions  Patient Location: PACU  Anesthesia Type: General  Level of Consciousness: awake, alert  and oriented  Airway & Oxygen Therapy: Patient Spontanous Breathing and Patient connected to nasal cannula oxygen  Post-op Assessment: Report given to PACU RN and Post -op Vital signs reviewed and stable  Post vital signs: stable  Complications: No apparent anesthesia complications

## 2012-03-04 NOTE — Anesthesia Preprocedure Evaluation (Addendum)
Anesthesia Evaluation  Patient identified by MRN, date of birth, ID band Patient awake    Reviewed: Allergy & Precautions, H&P , NPO status , Patient's Chart, lab work & pertinent test results  History of Anesthesia Complications (+) PONV  Airway Mallampati: III TM Distance: >3 FB Neck ROM: Full    Dental No notable dental hx. (+) Teeth Intact   Pulmonary    Pulmonary exam normal       Cardiovascular Atrial Fibrillation Rhythm:Regular Rate:Normal  Hx/o Paroxysmal Atrial Fibrillation last episode 2 weeks ago lasts about 20 min converts spontaneously on no Rx   Neuro/Psych  Headaches, Depression    GI/Hepatic Neg liver ROS, GERD-  Medicated and Controlled,  Endo/Other  Hypothyroidism   Renal/GU negative Renal ROS  negative genitourinary   Musculoskeletal negative musculoskeletal ROS (+)   Abdominal Normal abdominal exam  (+)   Peds  Hematology negative hematology ROS (+)   Anesthesia Other Findings   Reproductive/Obstetrics Dysmenorrhea                          Anesthesia Physical Anesthesia Plan  ASA: II  Anesthesia Plan: General   Post-op Pain Management:    Induction: Intravenous  Airway Management Planned: Oral ETT  Additional Equipment:   Intra-op Plan:   Post-operative Plan: Extubation in OR  Informed Consent: I have reviewed the patients History and Physical, chart, labs and discussed the procedure including the risks, benefits and alternatives for the proposed anesthesia with the patient or authorized representative who has indicated his/her understanding and acceptance.   Dental advisory given  Plan Discussed with: CRNA, Surgeon and Anesthesiologist  Anesthesia Plan Comments:         Anesthesia Quick Evaluation

## 2012-03-04 NOTE — Anesthesia Postprocedure Evaluation (Signed)
  Anesthesia Post-op Note  Patient: Ebony [name]  Procedure(s) Performed: Procedure(s) (LRB) with comments: HYSTERECTOMY ABDOMINAL (N/A) - with lysis of adhesions LAPAROSCOPY DIAGNOSTIC (N/A) - with lysis of adhesions  Patient Location: [facility]  Anesthesia Type: General  Level of Consciousness: awake, alert  and oriented  Airway and Oxygen Therapy: Patient Spontanous Breathing  Post-op Pain: none  Post-op Assessment: Post-op Vital signs reviewed and Patient's Cardiovascular Status Stable  Post-op Vital Signs: Reviewed and stable  Complications: No apparent anesthesia complications

## 2012-03-04 NOTE — Op Note (Signed)
03/04/2012  9:41 AM  PATIENT:  Marline Backbone Grinder  46 y.o. female  PRE-OPERATIVE DIAGNOSIS:  DYSMENORRHEA  POST-OPERATIVE DIAGNOSIS:  dysmenorrhea  Procedure:  Diagnostic laparoscopy with lysis of adhesions TAH Enterolysis  SURGEON:  Surgeon(s) and Role:    * Zelphia Cairo, MD - Primary    * Juluis Mire, MD - Assisting  PHYSICIAN ASSISTANT: J. Mccomb   ANESTHESIA:   general  EBL:  Total I/O In: 3000 [I.V.:3000] Out: 1100 [Urine:300; Blood:800]  BLOOD ADMINISTERED:none  DRAINS: none   LOCAL MEDICATIONS USED:  MARCAINE     SPECIMEN:  Source of Specimen:  uterus and cervix  DISPOSITION OF SPECIMEN:  PATHOLOGY  COUNTS:  YES  TOURNIQUET:  * No tourniquets in log *  DICTATION: .Other Dictation: Dictation Number pending  PLAN OF CARE: Admit to inpatient   PATIENT DISPOSITION:  PACU - hemodynamically stable.   Delay start of Pharmacological VTE agent (>24hrs) due to surgical blood loss or risk of bleeding: no

## 2012-03-04 NOTE — Anesthesia Postprocedure Evaluation (Signed)
Anesthesia Post Note  Patient: Ebony [name]  Procedure(s) Performed: Procedure(s) (LRB): HYSTERECTOMY ABDOMINAL (N/A) LAPAROSCOPY DIAGNOSTIC (N/A)  Anesthesia type: General  Patient location: PACU  Post pain: Pain level controlled  Post assessment: Post-op Vital signs reviewed  Last Vitals:  Filed Vitals:   03/04/12 1122  BP: 143/83  Pulse: 98  Temp: 36.9 C  Resp: 18    Post vital signs: Reviewed  Level of consciousness: sedated  Complications: No apparent anesthesia complications

## 2012-03-04 NOTE — Addendum Note (Signed)
Addendum  created 03/04/12 1731 by Shanon Payor, CRNA   Modules edited:Notes Section

## 2012-03-05 ENCOUNTER — Encounter (HOSPITAL_COMMUNITY): Payer: Self-pay | Admitting: Obstetrics and Gynecology

## 2012-03-05 LAB — CBC
HCT: 28.5 % — ABNORMAL LOW (ref 36.0–46.0)
Hemoglobin: 9.6 g/dL — ABNORMAL LOW (ref 12.0–15.0)
MCH: 28.7 pg (ref 26.0–34.0)
MCHC: 33.7 g/dL (ref 30.0–36.0)
MCV: 85.1 fL (ref 78.0–100.0)
RDW: 13.2 % (ref 11.5–15.5)

## 2012-03-05 MED ORDER — OXYCODONE-ACETAMINOPHEN 5-325 MG PO TABS
1.0000 | ORAL_TABLET | ORAL | Status: DC | PRN
  Administered 2012-03-05 – 2012-03-06 (×3): 2 via ORAL
  Filled 2012-03-05 (×3): qty 2

## 2012-03-05 MED ORDER — HYDROCODONE-ACETAMINOPHEN 5-325 MG PO TABS
1.0000 | ORAL_TABLET | ORAL | Status: DC | PRN
  Administered 2012-03-05: 2 via ORAL
  Filled 2012-03-05: qty 2

## 2012-03-05 MED ORDER — IBUPROFEN 600 MG PO TABS: 600.0000 mg | ORAL_TABLET | Freq: Four times a day (QID) | ORAL | Status: DC | PRN

## 2012-03-05 NOTE — Progress Notes (Signed)
Ur chart review completed.  

## 2012-03-05 NOTE — Progress Notes (Signed)
1 Day Post-Op Procedure(s) (LRB): HYSTERECTOMY ABDOMINAL (N/A) LAPAROSCOPY DIAGNOSTIC (N/A)  Subjective: Patient reports incisional pain and tolerating PO.  No flatus.  No CP/ SOB.    Objective: I have reviewed patient's vital signs, intake and output, medications and labs.  General: alert and cooperative Resp: clear to auscultation bilaterally Cardio: regular rate and rhythm, S1, S2 normal, no murmur, click, rub or gallop GI: soft, non-tender; bowel sounds normal; no masses,  no organomegaly Extremities: extremities normal, atraumatic, no cyanosis or edema Vaginal Bleeding: none  Assessment: s/p Procedure(s) (LRB) with comments: HYSTERECTOMY ABDOMINAL (N/A) - with lysis of adhesions LAPAROSCOPY DIAGNOSTIC (N/A) - with lysis of adhesions: progressing well  Plan: Advance diet Encourage ambulation Advance to PO medication  LOS: 1 day    Ebony [name] 03/05/2012, 8:32 AM

## 2012-03-05 NOTE — Progress Notes (Signed)
1 Day Post-Op Procedure(s) (LRB): HYSTERECTOMY ABDOMINAL (N/A) LAPAROSCOPY DIAGNOSTIC (N/A)  Subjective: Patient reports incisional pain, tolerating PO and no problems voiding.    Objective: I have reviewed patient's vital signs, intake and output and medications.  General: alert and cooperative GI: soft, non-tender; bowel sounds normal; no masses,  no organomegaly Extremities: extremities normal, atraumatic, no cyanosis or edema Vaginal Bleeding: none  Assessment: s/p Procedure(s) (LRB) with comments: HYSTERECTOMY ABDOMINAL (N/A) - with lysis of adhesions LAPAROSCOPY DIAGNOSTIC (N/A) - with lysis of adhesions: progressing well  Plan: Advance diet Encourage ambulation Advance to PO medication Discontinue IV fluids  LOS: 1 day    Ebony [name] 03/05/2012, 8:14 PM

## 2012-03-06 MED ORDER — OXYCODONE-ACETAMINOPHEN 5-325 MG PO TABS: 1.0000 | ORAL_TABLET | ORAL | Status: DC | PRN

## 2012-03-06 MED ORDER — IBUPROFEN 600 MG PO TABS: 600.0000 mg | ORAL_TABLET | Freq: Four times a day (QID) | ORAL | Status: DC | PRN

## 2012-03-06 NOTE — Progress Notes (Signed)
2 Days Post-Op Procedure(s) (LRB): HYSTERECTOMY ABDOMINAL (N/A) LAPAROSCOPY DIAGNOSTIC (N/A)  Subjective: Patient reports tolerating PO and no problems voiding.    Objective: I have reviewed patient's vital signs, intake and output and medications.  General: alert and cooperative GI: soft, non-tender; bowel sounds normal; no masses,  no organomegaly Extremities: extremities normal, atraumatic, no cyanosis or edema Vaginal Bleeding: none  Assessment: s/p Procedure(s) (LRB) with comments: HYSTERECTOMY ABDOMINAL (N/A) - with lysis of adhesions LAPAROSCOPY DIAGNOSTIC (N/A) - with lysis of adhesions: progressing well and tolerating diet  Plan: Discharge home  LOS: 2 days    Ebony [name] 03/06/2012, 8:45 AM

## 2012-03-06 NOTE — Discharge Summary (Signed)
NAMEHINAKO, DEVINCENT NO.:  [id number]  MEDICAL RECORD NO.:  [id number]  LOCATION:  9317                          FACILITY:  [facility]  PHYSICIAN:  Zelphia Cairo, MD    DATE OF BIRTH:  26-Aug-1965  DATE OF ADMISSION:  03/04/2012 DATE OF DISCHARGE:                              DISCHARGE SUMMARY   ADMISSION DIAGNOSIS:  Dysmenorrhea.  PROCEDURES: 1. Diagnostic laparoscopy. 2. Total abdominal hysterectomy.  HOSPITAL COURSE:  The patient was admitted to the hospital postoperatively where her pain was initially controlled with an IV PCA. Foley catheter was in place, and she was given IV fluids for hydration. On postoperative day #1, her hemoglobin was stable at 9.6.  She was able the tolerate a liquid diet.  Her IV was reduced and then eventually discontinued.  Once she was tolerating liquids, her PCA was discontinued and her pain was controlled with an IV PCA.  She was encouraged to ambulate and her Foley catheter was discontinued.  On postoperative day #2, she was ambulating and urinating without difficulty.  She was tolerating a regular diet without nausea and vomiting.  She was felt to be stable for discharge.  She was discharged home with prescriptions for Percocet, Motrin, and instructed to follow up in the office in 1 week for an incision check and staple removal.  Throughout her hospitalization, she remained afebrile and on exam her abdomen was soft, nontender with good bowel sounds and her incision was well-approximated without erythema, induration, or drainage on the day of discharge.     Zelphia Cairo, MD     GA/MEDQ  D:  03/06/2012  T:  03/06/2012  Job:  [id number]

## 2012-03-06 NOTE — Progress Notes (Signed)
Pt is discharged in the care of  Husband. Downstair per . downstairs per ambulatory.Stable.Understands all discharge instructions well Questions were asked and answered.Denies any excessive vAGINAL BLEEDING.oPERATIVE abdominal site is clean and dry.

## 2012-03-12 ENCOUNTER — Encounter (HOSPITAL_COMMUNITY): Payer: Self-pay | Admitting: Anesthesiology

## 2012-03-12 ENCOUNTER — Observation Stay (HOSPITAL_COMMUNITY): Payer: 59

## 2012-03-12 ENCOUNTER — Observation Stay (HOSPITAL_COMMUNITY)
Admission: AD | Admit: 2012-03-12 | Discharge: 2012-03-13 | Disposition: A | Discharge status: Home / Self Care | Payer: 59 | Source: Ambulatory Visit | Attending: Obstetrics and Gynecology | Admitting: Obstetrics and Gynecology

## 2012-03-12 DIAGNOSIS — K929 Disease of digestive system, unspecified: Principal | ICD-10-CM | POA: Insufficient documentation

## 2012-03-12 DIAGNOSIS — K5900 Constipation, unspecified: Secondary | ICD-10-CM | POA: Insufficient documentation

## 2012-03-12 DIAGNOSIS — E039 Hypothyroidism, unspecified: Secondary | ICD-10-CM | POA: Insufficient documentation

## 2012-03-12 DIAGNOSIS — R109 Unspecified abdominal pain: Secondary | ICD-10-CM | POA: Insufficient documentation

## 2012-03-12 DIAGNOSIS — K560 Paralytic ileus: Secondary | ICD-10-CM | POA: Insufficient documentation

## 2012-03-12 LAB — CBC
Hemoglobin: 10.9 g/dL — ABNORMAL LOW (ref 12.0–15.0)
Platelets: 387 10*3/uL (ref 150–400)
RBC: 3.9 MIL/uL (ref 3.87–5.11)

## 2012-03-12 LAB — COMPREHENSIVE METABOLIC PANEL
ALT: 27 U/L (ref 0–35)
AST: 31 U/L (ref 0–37)
Alkaline Phosphatase: 63 U/L (ref 39–117)
CO2: 26 mEq/L (ref 19–32)
GFR calc Af Amer: >90 mL/min (ref >90)
GFR calc non Af Amer: >90 mL/min (ref >90)
Glucose, Bld: 89 mg/dL (ref 70–99)
Potassium: 3.9 mEq/L (ref 3.5–5.1)
Sodium: 138 mEq/L (ref 135–145)
Total Protein: 7.3 g/dL (ref 6.0–8.3)

## 2012-03-12 MED ORDER — OXYCODONE-ACETAMINOPHEN 5-325 MG PO TABS: 1.0000 | ORAL_TABLET | ORAL | Status: DC | PRN

## 2012-03-12 MED ORDER — MENTHOL 3 MG MT LOZG: 1.0000 | LOZENGE | OROMUCOSAL | Status: DC | PRN

## 2012-03-12 MED ORDER — DEXTROSE IN LACTATED RINGERS 5 % IV SOLN
INTRAVENOUS | Status: DC
  Administered 2012-03-12 – 2012-03-13 (×2): via INTRAVENOUS

## 2012-03-12 MED ORDER — IBUPROFEN 600 MG PO TABS
600.0000 mg | ORAL_TABLET | Freq: Four times a day (QID) | ORAL | Status: DC | PRN
  Administered 2012-03-13: 600 mg via ORAL
  Filled 2012-03-12: qty 1

## 2012-03-12 MED ORDER — FAMOTIDINE 20 MG PO TABS
20.0000 mg | ORAL_TABLET | Freq: Every day | ORAL | Status: DC
  Administered 2012-03-12: 20 mg via ORAL
  Filled 2012-03-12: qty 1

## 2012-03-12 MED ORDER — IOHEXOL 300 MG/ML  SOLN
100.0000 mL | Freq: Once | INTRAMUSCULAR | Status: AC | PRN
  Administered 2012-03-12: 100 mL via INTRAVENOUS

## 2012-03-12 MED ORDER — POLYETHYLENE GLYCOL 3350 17 G PO PACK
17.0000 g | PACK | Freq: Two times a day (BID) | ORAL | Status: DC
  Administered 2012-03-13: 17 g via ORAL
  Filled 2012-03-12 (×2): qty 1

## 2012-03-12 MED ORDER — PROMETHAZINE HCL 25 MG/ML IJ SOLN: 25.0000 mg | Freq: Four times a day (QID) | INTRAMUSCULAR | Status: DC | PRN

## 2012-03-12 MED ORDER — DOCUSATE SODIUM 100 MG PO CAPS
100.0000 mg | ORAL_CAPSULE | Freq: Two times a day (BID) | ORAL | Status: DC
  Administered 2012-03-12 – 2012-03-13 (×2): 100 mg via ORAL
  Filled 2012-03-12 (×2): qty 1

## 2012-03-12 NOTE — H&P (Signed)
46 yo 8 days s/p laparoscopy, TAH w/ enterolysis presents for readmission due to severe constipation, nausea and pain.  Pt reports very little flatus and no BM since surgery.  Mild nausea, no emesis.  No f/c.  No back pain.  Incisional pain improving.  PMX:  Hypothyroidism PSH:  c-section, BTL, ablation All:  None Meds:  Synthroid, celexa SHx;  Negative  AF, VSS Gen - NAD CV - RRR Lungs - clear Abd - soft, mild-mod tenderness, ND.  Scant BS Ext - NT  Labs - reviewed CT scan normal, no evidence of GI or GU injury  A/P:  Suspect Ileus Aggressive bowel regimen Fleet enema IVF

## 2012-03-12 NOTE — Progress Notes (Signed)
Notified Md that patient had a very large bm without the need of the enema. Dr Renaldo Fiddler d/c'd enema, start on regular diet.

## 2012-03-13 ENCOUNTER — Encounter (HOSPITAL_COMMUNITY): Payer: Self-pay | Admitting: *Deleted

## 2012-03-13 MED ORDER — OXYCODONE-ACETAMINOPHEN 5-325 MG PO TABS: 1.0000 | ORAL_TABLET | ORAL | Status: DC | PRN

## 2012-03-13 NOTE — Progress Notes (Signed)
UR Chart review completed.  

## 2012-03-13 NOTE — Progress Notes (Signed)
Pt feeling much better this am after BM.  Able to tolerate dinner w/out N/V.  No f/c.  AF, VSS Gen - NAD Abd - soft, NT/ND.  + active BS  A/P:  Discharge home

## 2012-03-13 NOTE — Op Note (Signed)
Ebony [name], Ebony [name] NO.:  [id number]  MEDICAL RECORD NO.:  [id number]  LOCATION:  9317                          FACILITY:  [facility]  PHYSICIAN:  Zelphia Cairo, MD    DATE OF BIRTH:  Nov 12, 1965  DATE OF PROCEDURE:  03/04/2012 DATE OF DISCHARGE:  03/06/2012                              OPERATIVE REPORT   PREOPERATIVE DIAGNOSIS:  Dysmenorrhea.  POSTOPERATIVE DIAGNOSES: 1. Dysmenorrhea. 2. Bowel adhesions.  PROCEDURE: 1. Diagnostic laparoscopy with lysis of adhesions. 2. Total abdominal hysterectomy with enterolysis.  SURGEON:  Zelphia Cairo, MD  ASSISTANT:  Juluis Mire, MD  ANESTHESIA:  General.  ESTIMATED BLOOD LOSS:  300 mL.  SPECIMEN:  Uterus and cervix.  COMPLICATIONS:  None.  CONDITION:  Stable to recovery room.  PROCEDURE:  The patient was taken to the operating room after informed consent was obtained.  She was given general anesthesia and placed in the dorsal lithotomy position using Allen stirrups.  She was prepped and draped in sterile fashion and a Foley catheter was inserted sterilely. Bivalve speculum was placed in the vagina and a single-tooth tenaculum was placed on the anterior lip of the cervix.  An Acorn tenaculum was placed and the speculum was removed and our attention was turned to the abdomen.  Infraumbilical incision was made with the scalpel and this incision was extended bluntly to the level of the fascia using a Kelly clamp. Optical trocar was inserted under direct visualization.  Once intraperitoneal placement was confirmed, CO2 was turned on and a survey of the abdomen and pelvis was performed.  Right upper quadrant appeared normal.  She was noted to have a blanket of adhesions to the left edge of her C-section scar.  Bilateral adnexa appeared normal.  Uterus appeared normal, and 5-mm incision was then made in the right lower quadrant and a 5-mm trocar was inserted.  Blunt probe and blunt graspers were used to tent  the adhesions.  EnSeal device was used to cut and cauterize thin filmy adhesion.  Once thin filmy adhesions had been lysed, it was noted that the bowel was significantly adhered to the incision and so, another 5-mm incision was made in the suprapubic region in her C-section incision.  A 5-mm trocar was inserted under direct visualization.  At this time, significant bleeding was noted from the port site and the decision was made to perform a laparotomy.  All trocars and instruments were removed and a Pfannenstiel skin incision was made using her prior cesarean section scar.  Incision was carried down to the underlying fascia.  The fascia was incised in the midline and extended laterally using curved Mayo scissors.  Kocher clamps were used to grasp the superior and inferior portion of the fascia and the underlying rectus muscles were dissected off.  Peritoneum was identified and entered sharply.  This was extended superiorly and inferiorly.  Bowel adhesions were noted to be significant to the left abdominal wall. Sharp and blunt dissection were used to remove bowel adhesions from the abdominal wall.  The bowel was inspected after dissection and no injury was noted.  The bowel was then packed away using moist lap sponges and an O'Connor-O'Sullivan retractor was placed in  the abdomen.  Bladder blade was placed and the cornua of the uterus were grasped with Kelly clamps.  The round ligaments bilaterally were clamped, transected, and suture ligated.  Good hemostasis was achieved.  Anterior leaf of the broad ligament was then incised along the bladder reflection to the midline from both sides.  The bladder was then gently dissected off of the lower uterine segment and the cervix using a dry sponge.  The utero-ovarian ligaments were then clamped bilaterally, transected, and suture ligated. Hemostasis was visualized.  Uterine arteries were then skeletonized, clamped bilaterally, transected, and  suture ligated.  Hemostasis was assured bilaterally.  Bilateral uterosacral ligaments were then clamped, cut, and suture ligated.  The cervix and uterus were then amputated from the vagina.  The vaginal cuff angles were closed using a figure-of-eight stitch and the remainder of the vaginal cuff was closed using a series of interrupted figure-of-eight sutures.  Hemostasis was assured and the pelvis was copiously irrigated with saline.  All pedicles were reinspected and found to be hemostatic.  All laparotomy sponges and instruments were then removed from the abdomen.  Again, the bowel where adhesions had been present was reinspected and appeared normal.  The peritoneum was then closed with 0 Monocryl, the fascia was closed with looped 0 PDS and the skin was closed with staples.  Sponge, lap, instrument, and needle counts were correct x2.  Laparoscopic incisions were closed with 0 Vicryl and Dermabond was placed over top.  The patient was extubated and taken to the recovery room in stable condition.     Zelphia Cairo, MD     GA/MEDQ  D:  03/12/2012  T:  03/13/2012  Job:  [id number]

## 2012-03-13 NOTE — Discharge Summary (Signed)
Physician Discharge Summary  Patient ID: Ebony [name] MRN: [id number] DOB/AGE: 1965-12-25 46 y.o.  Admit date: 03/12/2012 Discharge date: 03/13/2012  Admission Diagnoses:  Postop ileus  Discharge Diagnoses:  Ileus hypothyroid  Discharged Condition: good  Hospital Course: Pt was admitted with abdominal pain, nausea and inability to have BM or pass flatus.  IVF and aggressive bowel regimen ordered.  Pt was able to have BM and began to feel much better.  Tolerating regular diet and ambulating w/out difficulty.    Consults: None  Significant Diagnostic Studies: labs: CBC, CMET, CT scan  Treatments: IV hydration  Discharge Exam: Blood pressure 115/79, pulse 81, temperature 98.2 F (36.8 C), temperature source Oral, resp. rate 18, height 5\' 5"  (1.651 m), weight 95.766 kg (211 lb 2 oz), last menstrual period 12/15/2010, SpO2 96.00%. see progress note  Disposition: 01-Home or Self Care     Medication List     As of 03/13/2012  7:07 AM    STOP taking these medications         bisacodyl 5 MG EC tablet   Commonly known as: DULCOLAX      TAKE these medications         citalopram 20 MG tablet   Commonly known as: CELEXA   Take 20 mg by mouth daily.      docusate sodium 100 MG capsule   Commonly known as: COLACE   Take 100 mg by mouth 2 (two) times daily.      ibuprofen 600 MG tablet   Commonly known as: ADVIL,MOTRIN   Take 1 tablet (600 mg total) by mouth every 6 (six) hours as needed.      levothyroxine 50 MCG tablet   Commonly known as: SYNTHROID, LEVOTHROID   Take 50 mcg by mouth every morning.      oxyCODONE-acetaminophen 5-325 MG per tablet   Commonly known as: PERCOCET/ROXICET   Take 1-2 tablets by mouth every 4 (four) hours as needed.      oxyCODONE-acetaminophen 5-325 MG per tablet   Commonly known as: PERCOCET/ROXICET   Take 1-2 tablets by mouth every 4 (four) hours as needed (moderate to severe pain (when tolerating fluids)).      polyethylene  glycol packet   Commonly known as: MIRALAX / GLYCOLAX   Take 17 g by mouth daily.      ranitidine 150 MG tablet   Commonly known as: ZANTAC   Take 150 mg by mouth at bedtime.           Follow-up Information    In 1 week to follow up.         Signed: Guilherme Schwenke 03/13/2012, 7:07 AM

## 2012-03-13 NOTE — Progress Notes (Signed)
Pt is discharged in the care of friend. Downstairs per ambulatory. Denies any pain or discomfort. States she had a  Bowel movement this a.m.Marland Kitchen Understands all discharged instructions well. Questions were asked and answered.Stable.

## 2012-06-13 ENCOUNTER — Encounter: Payer: Self-pay | Admitting: Family Medicine

## 2012-06-13 ENCOUNTER — Ambulatory Visit (INDEPENDENT_AMBULATORY_CARE_PROVIDER_SITE_OTHER): Payer: BC Managed Care – PPO | Admitting: Family Medicine

## 2012-06-13 VITALS — BP 132/86 | HR 78 | Temp 99.6°F | Ht 65.0 in | Wt 224.4 lb

## 2012-06-13 DIAGNOSIS — R51 Headache: Secondary | ICD-10-CM

## 2012-06-13 DIAGNOSIS — Z23 Encounter for immunization: Secondary | ICD-10-CM

## 2012-06-13 DIAGNOSIS — G43909 Migraine, unspecified, not intractable, without status migrainosus: Secondary | ICD-10-CM

## 2012-06-13 DIAGNOSIS — C44319 Basal cell carcinoma of skin of other parts of face: Secondary | ICD-10-CM

## 2012-06-13 DIAGNOSIS — F329 Major depressive disorder, single episode, unspecified: Secondary | ICD-10-CM

## 2012-06-13 DIAGNOSIS — Z0000 Encounter for general adult medical examination without abnormal findings: Secondary | ICD-10-CM

## 2012-06-13 DIAGNOSIS — C44310 Basal cell carcinoma of skin of unspecified parts of face: Secondary | ICD-10-CM

## 2012-06-13 DIAGNOSIS — E039 Hypothyroidism, unspecified: Secondary | ICD-10-CM

## 2012-06-13 MED ORDER — TETANUS-DIPHTH-ACELL PERTUSSIS 5-2.5-18.5 LF-MCG/0.5 IM SUSP: 0.5000 mL | Freq: Once | INTRAMUSCULAR | Status: DC

## 2012-06-13 MED ORDER — LEVOTHYROXINE SODIUM 50 MCG PO TABS: 50.0000 ug | ORAL_TABLET | ORAL | Status: DC

## 2012-06-13 MED ORDER — ALMOTRIPTAN MALATE 12.5 MG PO TABS: 12.5000 mg | ORAL_TABLET | ORAL | Status: DC | PRN

## 2012-06-13 MED ORDER — CITALOPRAM HYDROBROMIDE 20 MG PO TABS: 20.0000 mg | ORAL_TABLET | Freq: Every day | ORAL | Status: DC

## 2012-06-13 NOTE — Patient Instructions (Signed)
Preventive Care for Adults, Female A healthy lifestyle and preventive care can promote health and wellness. Preventive health guidelines for women include the following key practices.  A routine yearly physical is a good way to check with your caregiver about your health and preventive screening. It is a chance to share any concerns and updates on your health, and to receive a thorough exam.  Visit your dentist for a routine exam and preventive care every 6 months. Brush your teeth twice a day and floss once a day. Good oral hygiene prevents tooth decay and gum disease.  The frequency of eye exams is based on your age, health, family medical history, use of contact lenses, and other factors. Follow your caregiver's recommendations for frequency of eye exams.  Eat a healthy diet. Foods like vegetables, fruits, whole grains, low-fat dairy products, and lean protein foods contain the nutrients you need without too many calories. Decrease your intake of foods high in solid fats, added sugars, and salt. Eat the right amount of calories for you.Get information about a proper diet from your caregiver, if necessary.  Regular physical exercise is one of the most important things you can do for your health. Most adults should get at least 150 minutes of moderate-intensity exercise (any activity that increases your heart rate and causes you to sweat) each week. In addition, most adults need muscle-strengthening exercises on 2 or more days a week.  Maintain a healthy weight. The body mass index (BMI) is a screening tool to identify possible weight problems. It provides an estimate of body fat based on height and weight. Your caregiver can help determine your BMI, and can help you achieve or maintain a healthy weight.For adults 20 years and older:  A BMI below 18.5 is considered underweight.  A BMI of 18.5 to 24.9 is normal.  A BMI of 25 to 29.9 is considered overweight.  A BMI of 30 and above is  considered obese.  Maintain normal blood lipids and cholesterol levels by exercising and minimizing your intake of saturated fat. Eat a balanced diet with plenty of fruit and vegetables. Blood tests for lipids and cholesterol should begin at age 20 and be repeated every 5 years. If your lipid or cholesterol levels are high, you are over 50, or you are at high risk for heart disease, you may need your cholesterol levels checked more frequently.Ongoing high lipid and cholesterol levels should be treated with medicines if diet and exercise are not effective.  If you smoke, find out from your caregiver how to quit. If you do not use tobacco, do not start.  If you are pregnant, do not drink alcohol. If you are breastfeeding, be very cautious about drinking alcohol. If you are not pregnant and choose to drink alcohol, do not exceed 1 drink per day. One drink is considered to be 12 ounces (355 mL) of beer, 5 ounces (148 mL) of wine, or 1.5 ounces (44 mL) of liquor.  Avoid use of street drugs. Do not share needles with anyone. Ask for help if you need support or instructions about stopping the use of drugs.  High blood pressure causes heart disease and increases the risk of stroke. Your blood pressure should be checked at least every 1 to 2 years. Ongoing high blood pressure should be treated with medicines if weight loss and exercise are not effective.  If you are 55 to 45 years old, ask your caregiver if you should take aspirin to prevent strokes.  Diabetes   screening involves taking a blood sample to check your fasting blood sugar level. This should be done once every 3 years, after age 45, if you are within normal weight and without risk factors for diabetes. Testing should be considered at a younger age or be carried out more frequently if you are overweight and have at least 1 risk factor for diabetes.  Breast cancer screening is essential preventive care for women. You should practice "breast  self-awareness." This means understanding the normal appearance and feel of your breasts and may include breast self-examination. Any changes detected, no matter how small, should be reported to a caregiver. Women in their 20s and 30s should have a clinical breast exam (CBE) by a caregiver as part of a regular health exam every 1 to 3 years. After age 40, women should have a CBE every year. Starting at age 40, women should consider having a mammography (breast X-ray test) every year. Women who have a family history of breast cancer should talk to their caregiver about genetic screening. Women at a high risk of breast cancer should talk to their caregivers about having magnetic resonance imaging (MRI) and a mammography every year.  The Pap test is a screening test for cervical cancer. A Pap test can show cell changes on the cervix that might become cervical cancer if left untreated. A Pap test is a procedure in which cells are obtained and examined from the lower end of the uterus (cervix).  Women should have a Pap test starting at age 21.  Between ages 21 and 29, Pap tests should be repeated every 2 years.  Beginning at age 30, you should have a Pap test every 3 years as long as the past 3 Pap tests have been normal.  Some women have medical problems that increase the chance of getting cervical cancer. Talk to your caregiver about these problems. It is especially important to talk to your caregiver if a new problem develops soon after your last Pap test. In these cases, your caregiver may recommend more frequent screening and Pap tests.  The above recommendations are the same for women who have or have not gotten the vaccine for human papillomavirus (HPV).  If you had a hysterectomy for a problem that was not cancer or a condition that could lead to cancer, then you no longer need Pap tests. Even if you no longer need a Pap test, a regular exam is a good idea to make sure no other problems are  starting.  If you are between ages 65 and 70, and you have had normal Pap tests going back 10 years, you no longer need Pap tests. Even if you no longer need a Pap test, a regular exam is a good idea to make sure no other problems are starting.  If you have had past treatment for cervical cancer or a condition that could lead to cancer, you need Pap tests and screening for cancer for at least 20 years after your treatment.  If Pap tests have been discontinued, risk factors (such as a new sexual partner) need to be reassessed to determine if screening should be resumed.  The HPV test is an additional test that may be used for cervical cancer screening. The HPV test looks for the virus that can cause the cell changes on the cervix. The cells collected during the Pap test can be tested for HPV. The HPV test could be used to screen women aged 30 years and older, and should   be used in women of any age who have unclear Pap test results. After the age of 30, women should have HPV testing at the same frequency as a Pap test.  Colorectal cancer can be detected and often prevented. Most routine colorectal cancer screening begins at the age of 50 and continues through age 75. However, your caregiver may recommend screening at an earlier age if you have risk factors for colon cancer. On a yearly basis, your caregiver may provide home test kits to check for hidden blood in the stool. Use of a small camera at the end of a tube, to directly examine the colon (sigmoidoscopy or colonoscopy), can detect the earliest forms of colorectal cancer. Talk to your caregiver about this at age 50, when routine screening begins. Direct examination of the colon should be repeated every 5 to 10 years through age 75, unless early forms of pre-cancerous polyps or small growths are found.  Hepatitis C blood testing is recommended for all people born from 1945 through 1965 and any individual with known risks for hepatitis C.  Practice  safe sex. Use condoms and avoid high-risk sexual practices to reduce the spread of sexually transmitted infections (STIs). STIs include gonorrhea, chlamydia, syphilis, trichomonas, herpes, HPV, and human immunodeficiency virus (HIV). Herpes, HIV, and HPV are viral illnesses that have no cure. They can result in disability, cancer, and death. Sexually active women aged 25 and younger should be checked for chlamydia. Older women with new or multiple partners should also be tested for chlamydia. Testing for other STIs is recommended if you are sexually active and at increased risk.  Osteoporosis is a disease in which the bones lose minerals and strength with aging. This can result in serious bone fractures. The risk of osteoporosis can be identified using a bone density scan. Women ages 65 and over and women at risk for fractures or osteoporosis should discuss screening with their caregivers. Ask your caregiver whether you should take a calcium supplement or vitamin D to reduce the rate of osteoporosis.  Menopause can be associated with physical symptoms and risks. Hormone replacement therapy is available to decrease symptoms and risks. You should talk to your caregiver about whether hormone replacement therapy is right for you.  Use sunscreen with sun protection factor (SPF) of 30 or more. Apply sunscreen liberally and repeatedly throughout the day. You should seek shade when your shadow is shorter than you. Protect yourself by wearing long sleeves, pants, a wide-brimmed hat, and sunglasses year round, whenever you are outdoors.  Once a month, do a whole body skin exam, using a mirror to look at the skin on your back. Notify your caregiver of new moles, moles that have irregular borders, moles that are larger than a pencil eraser, or moles that have changed in shape or color.  Stay current with required immunizations.  Influenza. You need a dose every fall (or winter). The composition of the flu vaccine  changes each year, so being vaccinated once is not enough.  Pneumococcal polysaccharide. You need 1 to 2 doses if you smoke cigarettes or if you have certain chronic medical conditions. You need 1 dose at age 65 (or older) if you have never been vaccinated.  Tetanus, diphtheria, pertussis (Tdap, Td). Get 1 dose of Tdap vaccine if you are younger than age 65, are over 65 and have contact with an infant, are a healthcare worker, are pregnant, or simply want to be protected from whooping cough. After that, you need a Td   booster dose every 10 years. Consult your caregiver if you have not had at least 3 tetanus and diphtheria-containing shots sometime in your life or have a deep or dirty wound.  HPV. You need this vaccine if you are a woman age 26 or younger. The vaccine is given in 3 doses over 6 months.  Measles, mumps, rubella (MMR). You need at least 1 dose of MMR if you were born in 1957 or later. You may also need a second dose.  Meningococcal. If you are age 19 to 21 and a first-year college student living in a residence hall, or have one of several medical conditions, you need to get vaccinated against meningococcal disease. You may also need additional booster doses.  Zoster (shingles). If you are age 60 or older, you should get this vaccine.  Varicella (chickenpox). If you have never had chickenpox or you were vaccinated but received only 1 dose, talk to your caregiver to find out if you need this vaccine.  Hepatitis A. You need this vaccine if you have a specific risk factor for hepatitis A virus infection or you simply wish to be protected from this disease. The vaccine is usually given as 2 doses, 6 to 18 months apart.  Hepatitis B. You need this vaccine if you have a specific risk factor for hepatitis B virus infection or you simply wish to be protected from this disease. The vaccine is given in 3 doses, usually over 6 months. Preventive Services / Frequency Ages 19 to 39  Blood  pressure check.** / Every 1 to 2 years.  Lipid and cholesterol check.** / Every 5 years beginning at age 20.  Clinical breast exam.** / Every 3 years for women in their 20s and 30s.  Pap test.** / Every 2 years from ages 21 through 29. Every 3 years starting at age 30 through age 65 or 70 with a history of 3 consecutive normal Pap tests.  HPV screening.** / Every 3 years from ages 30 through ages 65 to 70 with a history of 3 consecutive normal Pap tests.  Hepatitis C blood test.** / For any individual with known risks for hepatitis C.  Skin self-exam. / Monthly.  Influenza immunization.** / Every year.  Pneumococcal polysaccharide immunization.** / 1 to 2 doses if you smoke cigarettes or if you have certain chronic medical conditions.  Tetanus, diphtheria, pertussis (Tdap, Td) immunization. / A one-time dose of Tdap vaccine. After that, you need a Td booster dose every 10 years.  HPV immunization. / 3 doses over 6 months, if you are 26 and younger.  Measles, mumps, rubella (MMR) immunization. / You need at least 1 dose of MMR if you were born in 1957 or later. You may also need a second dose.  Meningococcal immunization. / 1 dose if you are age 19 to 21 and a first-year college student living in a residence hall, or have one of several medical conditions, you need to get vaccinated against meningococcal disease. You may also need additional booster doses.  Varicella immunization.** / Consult your caregiver.  Hepatitis A immunization.** / Consult your caregiver. 2 doses, 6 to 18 months apart.  Hepatitis B immunization.** / Consult your caregiver. 3 doses usually over 6 months. Ages 40 to 64  Blood pressure check.** / Every 1 to 2 years.  Lipid and cholesterol check.** / Every 5 years beginning at age 20.  Clinical breast exam.** / Every year after age 40.  Mammogram.** / Every year beginning at age 40   and continuing for as long as you are in good health. Consult with your  caregiver.  Pap test.** / Every 3 years starting at age 30 through age 65 or 70 with a history of 3 consecutive normal Pap tests.  HPV screening.** / Every 3 years from ages 30 through ages 65 to 70 with a history of 3 consecutive normal Pap tests.  Fecal occult blood test (FOBT) of stool. / Every year beginning at age 50 and continuing until age 75. You may not need to do this test if you get a colonoscopy every 10 years.  Flexible sigmoidoscopy or colonoscopy.** / Every 5 years for a flexible sigmoidoscopy or every 10 years for a colonoscopy beginning at age 50 and continuing until age 75.  Hepatitis C blood test.** / For all people born from 1945 through 1965 and any individual with known risks for hepatitis C.  Skin self-exam. / Monthly.  Influenza immunization.** / Every year.  Pneumococcal polysaccharide immunization.** / 1 to 2 doses if you smoke cigarettes or if you have certain chronic medical conditions.  Tetanus, diphtheria, pertussis (Tdap, Td) immunization.** / A one-time dose of Tdap vaccine. After that, you need a Td booster dose every 10 years.  Measles, mumps, rubella (MMR) immunization. / You need at least 1 dose of MMR if you were born in 1957 or later. You may also need a second dose.  Varicella immunization.** / Consult your caregiver.  Meningococcal immunization.** / Consult your caregiver.  Hepatitis A immunization.** / Consult your caregiver. 2 doses, 6 to 18 months apart.  Hepatitis B immunization.** / Consult your caregiver. 3 doses, usually over 6 months. Ages 65 and over  Blood pressure check.** / Every 1 to 2 years.  Lipid and cholesterol check.** / Every 5 years beginning at age 20.  Clinical breast exam.** / Every year after age 40.  Mammogram.** / Every year beginning at age 40 and continuing for as long as you are in good health. Consult with your caregiver.  Pap test.** / Every 3 years starting at age 30 through age 65 or 70 with a 3  consecutive normal Pap tests. Testing can be stopped between 65 and 70 with 3 consecutive normal Pap tests and no abnormal Pap or HPV tests in the past 10 years.  HPV screening.** / Every 3 years from ages 30 through ages 65 or 70 with a history of 3 consecutive normal Pap tests. Testing can be stopped between 65 and 70 with 3 consecutive normal Pap tests and no abnormal Pap or HPV tests in the past 10 years.  Fecal occult blood test (FOBT) of stool. / Every year beginning at age 50 and continuing until age 75. You may not need to do this test if you get a colonoscopy every 10 years.  Flexible sigmoidoscopy or colonoscopy.** / Every 5 years for a flexible sigmoidoscopy or every 10 years for a colonoscopy beginning at age 50 and continuing until age 75.  Hepatitis C blood test.** / For all people born from 1945 through 1965 and any individual with known risks for hepatitis C.  Osteoporosis screening.** / A one-time screening for women ages 65 and over and women at risk for fractures or osteoporosis.  Skin self-exam. / Monthly.  Influenza immunization.** / Every year.  Pneumococcal polysaccharide immunization.** / 1 dose at age 65 (or older) if you have never been vaccinated.  Tetanus, diphtheria, pertussis (Tdap, Td) immunization. / A one-time dose of Tdap vaccine if you are over   65 and have contact with an infant, are a healthcare worker, or simply want to be protected from whooping cough. After that, you need a Td booster dose every 10 years.  Varicella immunization.** / Consult your caregiver.  Meningococcal immunization.** / Consult your caregiver.  Hepatitis A immunization.** / Consult your caregiver. 2 doses, 6 to 18 months apart.  Hepatitis B immunization.** / Check with your caregiver. 3 doses, usually over 6 months. ** Family history and personal history of risk and conditions may change your caregiver's recommendations. Document Released: 07/17/2001 Document Revised: 08/13/2011  Document Reviewed: 10/16/2010 [employer] Patient Information 2013 [employer].  

## 2012-06-15 ENCOUNTER — Encounter: Payer: Self-pay | Admitting: Family Medicine

## 2012-06-15 DIAGNOSIS — E039 Hypothyroidism, unspecified: Secondary | ICD-10-CM | POA: Insufficient documentation

## 2012-06-15 HISTORY — DX: Hypothyroidism, unspecified: E03.9

## 2012-06-15 NOTE — Assessment & Plan Note (Signed)
Ibuprofen works at times, given an rx for Axert to try with Ibuprofen. Failed Relpax, Maxalt and Imitrex. Discussed ways to minimize HA

## 2012-06-15 NOTE — Progress Notes (Signed)
Patient ID: Ebony [name], female   DOB: Mar 03, 1966, 47 y.o.   MRN: [id number] Ebony [name] [id number] 09-26-1965 06/15/2012      Progress Note New Patient  Subjective  Chief Complaint  Chief Complaint  Patient presents with  . Establish Care    new patient  . Injections    TDAP    HPI  Patient is a 47 year old Caucasian female who is in today for new patient appointment. She has a history of hypothyroidism but has not been taking her levothyroxine since she had a partial hysterectomy back in October. She is complaining of fatigue and weight gain. Otherwise she says her health has been relatively good. She's a history of migraines but at this point has roughly 2 year. Sometimes responds to ibuprofen. Trochanter that has not been particularly helpful. She notes when she had her hysterectomy back in October she ended up with a postop ileus but never required surgery. Today she feels well. Denies chest pain, palpitations, shortness of breath, GI or GU concerns at this time  Past Medical History  Diagnosis Date  . Hypothyroidism   . GERD (gastroesophageal reflux disease)   . Depression   . PONV (postoperative nausea and vomiting)   . Hypertension     only with preg. 4yrs ago 2002- no meds  . Dysrhythmia     some occ a-fib, no meds  . Headache     otc meds prn, migraine  . Chicken pox as a child  . Cancer     skin  . Preventative health care 06/13/2012  . BCC (basal cell carcinoma), face     skin   . Hypothyroid 06/15/2012    Past Surgical History  Procedure Date  . Tonsillectomy   . Cesarean section 2002  . Laparoscopic tubal ligation 01/03/2011    Procedure: LAPAROSCOPIC TUBAL LIGATION;  Surgeon: Zelphia Cairo;  Location: [facility];  Service: Gynecology;  Laterality: Bilateral;  with filshie clips  . Tubal ligation   . Abdominal hysterectomy 03/04/2012    Procedure: HYSTERECTOMY ABDOMINAL;  Surgeon: Zelphia Cairo, MD;  Location: [facility];  Service: Gynecology;   Laterality: N/A;  with lysis of adhesions  . Laparoscopy 03/04/2012    Procedure: LAPAROSCOPY DIAGNOSTIC;  Surgeon: Zelphia Cairo, MD;  Location: [facility];  Service: Gynecology;  Laterality: N/A;  with lysis of adhesions    Family History  Problem Relation Age of Onset  . Cancer Mother     lung (06-13-11) breast -10 years ago  . Hypertension Mother   . Depression Mother   . Cancer Brother     skin  . Other Maternal Grandmother     spinal stenosis  . Cancer Maternal Grandfather     prostate  . Emphysema Maternal Grandfather     History   Social History  . Marital Status: Married    Spouse Name: N/A    Number of Children: N/A  . Years of Education: N/A   Occupational History  . Not on file.   Social History Main Topics  . Smoking status: Never Smoker   . Smokeless tobacco: Never Used  . Alcohol Use: Yes     Comment: occasionally   . Drug Use: No  . Sexually Active: Yes    Birth Control/ Protection: Surgical   Other Topics Concern  . Not on file   Social History Narrative  . No narrative on file    Current Outpatient Prescriptions on File Prior to Visit  Medication Sig Dispense Refill  .  citalopram (CELEXA) 20 MG tablet Take 1 tablet (20 mg total) by mouth daily.  30 tablet  2  . ibuprofen (ADVIL,MOTRIN) 600 MG tablet Take 1 tablet (600 mg total) by mouth every 6 (six) hours as needed.  30 tablet  2  . ranitidine (ZANTAC) 150 MG tablet Take 150 mg by mouth at bedtime.        Marland Kitchen almotriptan (AXERT) 12.5 MG tablet Take 1 tablet (12.5 mg total) by mouth as needed for migraine. may repeat in 2 hours if needed  10 tablet  0  . levothyroxine (SYNTHROID, LEVOTHROID) 50 MCG tablet Take 1 tablet (50 mcg total) by mouth every morning.  30 tablet  0   No current facility-administered medications on file prior to visit.    No Known Allergies  Review of Systems  Review of Systems  Constitutional: Negative for fever, chills and malaise/fatigue.  HENT: Negative for hearing  loss, nosebleeds and congestion.   Eyes: Negative for discharge.  Respiratory: Negative for cough, sputum production, shortness of breath and wheezing.   Cardiovascular: Negative for chest pain, palpitations and leg swelling.  Gastrointestinal: Negative for heartburn, nausea, vomiting, abdominal pain, diarrhea, constipation and blood in stool.  Genitourinary: Negative for dysuria, urgency, frequency and hematuria.  Musculoskeletal: Negative for myalgias, back pain and falls.  Skin: Negative for rash.  Neurological: Negative for dizziness, tremors, sensory change, focal weakness, loss of consciousness, weakness and headaches.  Endo/Heme/Allergies: Negative for polydipsia. Does not bruise/bleed easily.  Psychiatric/Behavioral: Negative for depression and suicidal ideas. The patient is not nervous/anxious and does not have insomnia.     Objective  BP 132/86  Pulse 78  Temp 99.6 F (37.6 C) (Temporal)  Ht 5\' 5"  (1.651 m)  Wt 224 lb 6.4 oz (101.787 kg)  BMI 37.34 kg/m2  SpO2 98%  LMP 12/15/2010  Physical Exam  Physical Exam  Constitutional: She is oriented to person, place, and time and well-developed, well-nourished, and in no distress. No distress.  HENT:  Head: Normocephalic and atraumatic.  Right Ear: External ear normal.  Left Ear: External ear normal.  Nose: Nose normal.  Mouth/Throat: Oropharynx is clear and moist. No oropharyngeal exudate.  Eyes: Conjunctivae normal are normal. Pupils are equal, round, and reactive to light. Right eye exhibits no discharge. Left eye exhibits no discharge. No scleral icterus.  Neck: Normal range of motion. Neck supple. No thyromegaly present.  Cardiovascular: Normal rate, regular rhythm, normal heart sounds and intact distal pulses.   No murmur heard. Pulmonary/Chest: Effort normal and breath sounds normal. No respiratory distress. She has no wheezes. She has no rales.  Abdominal: Soft. Bowel sounds are normal. She exhibits no distension and  no mass. There is no tenderness.  Musculoskeletal: Normal range of motion. She exhibits no edema and no tenderness.  Lymphadenopathy:    She has no cervical adenopathy.  Neurological: She is alert and oriented to person, place, and time. She has normal reflexes. No cranial nerve deficit. Coordination normal.  Skin: Skin is warm and dry. No rash noted. She is not diaphoretic.  Psychiatric: Mood, memory and affect normal.       Assessment & Plan  BCC (basal cell carcinoma), face No concerning lesions identified on exam today  Headache Ibuprofen works at times, given an rx for Axert to try with Ibuprofen. Failed Relpax, Maxalt and Imitrex. Discussed ways to minimize HA  Preventative health care Tdap given today  Hypothyroid Has not been taking her Synthroid for the past few months, restarted today,  check fasting labs next week

## 2012-06-15 NOTE — Assessment & Plan Note (Signed)
Has not been taking her Synthroid for the past few months, restarted today, check fasting labs next week

## 2012-06-15 NOTE — Assessment & Plan Note (Signed)
No concerning lesions identified on exam today

## 2012-06-15 NOTE — Assessment & Plan Note (Signed)
Tdap given today.

## 2012-06-23 ENCOUNTER — Other Ambulatory Visit (INDEPENDENT_AMBULATORY_CARE_PROVIDER_SITE_OTHER): Payer: BC Managed Care – PPO

## 2012-06-23 DIAGNOSIS — E039 Hypothyroidism, unspecified: Secondary | ICD-10-CM

## 2012-06-23 DIAGNOSIS — Z0000 Encounter for general adult medical examination without abnormal findings: Secondary | ICD-10-CM

## 2012-06-23 LAB — CBC
HCT: 39.2 % (ref 36.0–46.0)
MCV: 82.4 fl (ref 78.0–100.0)
Platelets: 289 10*3/uL (ref 150.0–400.0)
RBC: 4.76 Mil/uL (ref 3.87–5.11)

## 2012-06-23 NOTE — Progress Notes (Signed)
Labs only

## 2012-06-24 LAB — RENAL FUNCTION PANEL
Albumin: 3.9 g/dL (ref 3.5–5.2)
CO2: 24 mEq/L (ref 19–32)
Calcium: 8.7 mg/dL (ref 8.4–10.5)
Creatinine, Ser: 0.7 mg/dL (ref 0.4–1.2)
Glucose, Bld: 91 mg/dL (ref 70–99)
Potassium: 3.8 mEq/L (ref 3.5–5.1)

## 2012-06-24 LAB — HEPATIC FUNCTION PANEL
ALT: 20 U/L (ref 0–35)
AST: 22 U/L (ref 0–37)
Bilirubin, Direct: 0 mg/dL (ref 0.0–0.3)
Total Protein: 7.4 g/dL (ref 6.0–8.3)

## 2012-06-24 LAB — LIPID PANEL
Cholesterol: 191 mg/dL (ref 0–200)
LDL Cholesterol: 119 mg/dL — ABNORMAL HIGH (ref 0–99)

## 2012-06-24 MED ORDER — ALPRAZOLAM 0.25 MG PO TABS: ORAL_TABLET | ORAL | Status: DC

## 2012-06-24 NOTE — Addendum Note (Signed)
Addended by: Court Joy on: 06/24/2012 05:22 PM   Modules accepted: Orders

## 2012-06-24 NOTE — Progress Notes (Signed)
Quick Note:  Patient Informed and voiced understanding.  Pt also stated that her mom is terminal w/lung cancer and was given only a couple weeks to live. Pt is going back to [place] on Sunday but is having a hard time sleeping. Pt doesn't want something that is going to "knock" her out completely because she needs to hear her mom but would like something to take at night to let her mind relax at night. Please advise? Send RX to [facility] ______

## 2012-07-01 ENCOUNTER — Telehealth: Payer: Self-pay | Admitting: Family Medicine

## 2012-07-01 NOTE — Telephone Encounter (Signed)
Received medical records from [facility] °

## 2012-09-30 ENCOUNTER — Telehealth: Payer: Self-pay | Admitting: Family Medicine

## 2012-09-30 NOTE — Telephone Encounter (Signed)
Please advise 

## 2012-09-30 NOTE — Telephone Encounter (Signed)
Have her continue warm compresses and aleve twice a day and then try and squeeze her in later in the week for evaluation.

## 2012-09-30 NOTE — Telephone Encounter (Signed)
Patient Information:  Caller Name: Doyle  Phone: [phone]  Patient: Ebony [name], Ebony [name]  Gender: Female  DOB: Sep 27, 1965  Age: 47 Years  PCP: Danise [facility])  Pregnant: No  Office Follow Up:  Does the office need to follow up with this patient?: Yes  Instructions For The Office: Pt needs to be een today, 09/30/12 and no appts left.   Symptoms  Reason For Call & Symptoms: Pt had a serious migraine last week and went tot the ED adnd got IV meds in her upper left arm above the [facility] space.  09/29/12 she noticed a hot, red hard area about 2" x 1" inch .  She went back to the ED  and they did an U/S and pt has a blood clot in her left arm in a supserficicial vein.  Was told to use warm compresses, take some Aleve and  and see her PCP.  Reviewed Health History In EMR: Yes  Reviewed Medications In EMR: Yes  Reviewed Allergies In EMR: Yes  Reviewed Surgeries / Procedures: Yes  Date of Onset of Symptoms: 09/29/2012  Treatments Tried: Warm  comprsesses and Aleve  Treatments Tried Worked: No OB / GYN:  LMP: Unknown  Guideline(s) Used:  Skin Injury  Wound Infection  Disposition Per Guideline:   See Today in Office  Reason For Disposition Reached:   Skin redness around the wound larger than 2 inches (5 cm)  Advice Given:  Continue with care advice from the ED   Patient Will Follow Care Advice:  YES

## 2012-10-01 NOTE — Telephone Encounter (Signed)
Pt informed and states it looks a little better today still sore though. appt scheduled

## 2012-10-02 ENCOUNTER — Ambulatory Visit: Payer: BC Managed Care – PPO | Admitting: Family Medicine

## 2012-10-06 ENCOUNTER — Ambulatory Visit (INDEPENDENT_AMBULATORY_CARE_PROVIDER_SITE_OTHER): Payer: BC Managed Care – PPO | Admitting: Family Medicine

## 2012-10-06 ENCOUNTER — Encounter: Payer: Self-pay | Admitting: Family Medicine

## 2012-10-06 VITALS — BP 126/70 | HR 75 | Temp 97.5°F | Ht 65.0 in | Wt 217.1 lb

## 2012-10-06 DIAGNOSIS — F329 Major depressive disorder, single episode, unspecified: Secondary | ICD-10-CM

## 2012-10-06 DIAGNOSIS — R51 Headache: Secondary | ICD-10-CM

## 2012-10-06 DIAGNOSIS — E039 Hypothyroidism, unspecified: Secondary | ICD-10-CM

## 2012-10-06 DIAGNOSIS — F341 Dysthymic disorder: Secondary | ICD-10-CM

## 2012-10-06 DIAGNOSIS — F418 Other specified anxiety disorders: Secondary | ICD-10-CM

## 2012-10-06 HISTORY — DX: Other specified anxiety disorders: F41.8

## 2012-10-06 MED ORDER — LEVOTHYROXINE SODIUM 50 MCG PO TABS: 50.0000 ug | ORAL_TABLET | ORAL | Status: DC

## 2012-10-06 MED ORDER — CITALOPRAM HYDROBROMIDE 20 MG PO TABS: 20.0000 mg | ORAL_TABLET | Freq: Every day | ORAL | Status: DC

## 2012-10-06 NOTE — Progress Notes (Signed)
Patient ID: Ebony [name], female   DOB: 1965/08/11, 47 y.o.   MRN: [id number] Ebony [name] [id number] 04-16-1966 10/06/2012      Progress Note-Follow Up  Subjective  Chief Complaint  Chief Complaint  Patient presents with  . Follow-up    on pain    HPI  Patient is a 47 year old Caucasian female who is in today for followup on blood clot. She had to procedure for migraine and when she got her IV she formed a superficial blood clot. She went back to the ER couple days later with swelling and discomfort in her left upper arm and ultrasound confirm local. Since that time she's been applying moist compresses and it is nearly resolved. History slight swelling and discomfort. No chest pain, palpitations, shortness of breath, GI or GU concerns. She is concerned because her first cousin her mother sisters daughter just was diagnosed with breast cancer at 80 and her mom was diagnosed with breast cancer in her 13s. Patient herself has no breast complaints. No anorexia fevers malaise or myalgias  Past Medical History  Diagnosis Date  . Hypothyroidism   . GERD (gastroesophageal reflux disease)   . Depression   . PONV (postoperative nausea and vomiting)   . Hypertension     only with preg. 29yrs ago 2002- no meds  . Dysrhythmia     some occ a-fib, no meds  . Headache     otc meds prn, migraine  . Chicken pox as a child  . Cancer     skin  . Preventative health care 06/13/2012  . BCC (basal cell carcinoma), face     skin   . Hypothyroid 06/15/2012    Past Surgical History  Procedure Laterality Date  . Tonsillectomy    . Cesarean section  2002  . Laparoscopic tubal ligation  01/03/2011    Procedure: LAPAROSCOPIC TUBAL LIGATION;  Surgeon: Zelphia Cairo;  Location: [facility];  Service: Gynecology;  Laterality: Bilateral;  with filshie clips  . Tubal ligation    . Abdominal hysterectomy  03/04/2012    Procedure: HYSTERECTOMY ABDOMINAL;  Surgeon: Zelphia Cairo, MD;  Location: [facility];   Service: Gynecology;  Laterality: N/A;  with lysis of adhesions  . Laparoscopy  03/04/2012    Procedure: LAPAROSCOPY DIAGNOSTIC;  Surgeon: Zelphia Cairo, MD;  Location: [facility];  Service: Gynecology;  Laterality: N/A;  with lysis of adhesions    Family History  Problem Relation Age of Onset  . Cancer Mother     lung (06-13-11) breast -10 years ago  . Hypertension Mother   . Depression Mother   . Cancer Brother     skin  . Other Maternal Grandmother     spinal stenosis  . Cancer Maternal Grandfather     prostate  . Emphysema Maternal Grandfather     History   Social History  . Marital Status: Married    Spouse Name: N/A    Number of Children: N/A  . Years of Education: N/A   Occupational History  . Not on file.   Social History Main Topics  . Smoking status: Never Smoker   . Smokeless tobacco: Never Used  . Alcohol Use: Yes     Comment: occasionally   . Drug Use: No  . Sexually Active: Yes    Birth Control/ Protection: Surgical   Other Topics Concern  . Not on file   Social History Narrative  . No narrative on file    Current Outpatient Prescriptions on File Prior  to Visit  Medication Sig Dispense Refill  . almotriptan (AXERT) 12.5 MG tablet Take 1 tablet (12.5 mg total) by mouth as needed for migraine. may repeat in 2 hours if needed  10 tablet  0  . ALPRAZolam (XANAX) 0.25 MG tablet 1/2 to 2 tabs po bid prn anxiety and/or insomnia  40 tablet  1  . citalopram (CELEXA) 20 MG tablet Take 1 tablet (20 mg total) by mouth daily.  30 tablet  2  . ibuprofen (ADVIL,MOTRIN) 600 MG tablet Take 1 tablet (600 mg total) by mouth every 6 (six) hours as needed.  30 tablet  2  . levothyroxine (SYNTHROID, LEVOTHROID) 50 MCG tablet Take 1 tablet (50 mcg total) by mouth every morning.  30 tablet  0  . ranitidine (ZANTAC) 150 MG tablet Take 150 mg by mouth at bedtime.         No current facility-administered medications on file prior to visit.    No Known Allergies  Review of  Systems  Review of Systems  Constitutional: Negative for fever and malaise/fatigue.  HENT: Negative for congestion.   Eyes: Negative for discharge.  Respiratory: Negative for shortness of breath.   Cardiovascular: Negative for chest pain, palpitations and leg swelling.  Gastrointestinal: Negative for nausea, abdominal pain and diarrhea.  Genitourinary: Negative for dysuria.  Musculoskeletal: Positive for joint pain. Negative for falls.       Arm pain  Skin: Negative for rash.  Neurological: Negative for loss of consciousness and headaches.  Endo/Heme/Allergies: Negative for polydipsia.  Psychiatric/Behavioral: Negative for depression and suicidal ideas. The patient is nervous/anxious and has insomnia.     Objective  BP 126/70  Pulse 75  Temp(Src) 97.5 F (36.4 C) (Oral)  Ht 5\' 5"  (1.651 m)  Wt 217 lb 1.3 oz (98.467 kg)  BMI 36.12 kg/m2  SpO2 97%  LMP 12/15/2010  Physical Exam  Physical Exam  Constitutional: She is oriented to person, place, and time and well-developed, well-nourished, and in no distress. No distress.  HENT:  Head: Normocephalic and atraumatic.  Eyes: Conjunctivae are normal.  Neck: Neck supple. No thyromegaly present.  Cardiovascular: Normal rate, regular rhythm and normal heart sounds.  Exam reveals no gallop.   No murmur heard. Pulmonary/Chest: Effort normal and breath sounds normal. She has no wheezes.  Abdominal: She exhibits no distension and no mass.  Musculoskeletal: She exhibits no edema.  Lymphadenopathy:    She has no cervical adenopathy.  Neurological: She is alert and oriented to person, place, and time.  Skin: Skin is warm and dry. No rash noted. She is not diaphoretic. There is erythema.  Left arm mildly erythematous  Psychiatric: Memory, affect and judgment normal.    Lab Results  Component Value Date   TSH 7.85* 06/23/2012   Lab Results  Component Value Date   WBC 8.9 06/23/2012   HGB 12.9 06/23/2012   HCT 39.2 06/23/2012   MCV  82.4 06/23/2012   PLT 289.0 06/23/2012   Lab Results  Component Value Date   CREATININE 0.7 06/23/2012   BUN 16 06/23/2012   NA 136 06/23/2012   K 3.8 06/23/2012   CL 104 06/23/2012   CO2 24 06/23/2012   Lab Results  Component Value Date   ALT 20 06/23/2012   AST 22 06/23/2012   ALKPHOS 66 06/23/2012   BILITOT 0.4 06/23/2012   Lab Results  Component Value Date   CHOL 191 06/23/2012   Lab Results  Component Value Date   HDL 38.80* 06/23/2012  Lab Results  Component Value Date   LDLCALC 119* 06/23/2012   Lab Results  Component Value Date   TRIG 168.0* 06/23/2012   Lab Results  Component Value Date   CHOLHDL 5 06/23/2012     Assessment & Plan  Headache Recent Migraine had her end up in the ER, resolved now. Encouraged small, frequent meals with good hydration. Try Excedrin Migraine prn, continue to use Ibuprofen. Has not tried Axert  Hypothyroid Has not taken her Levothyroxine for over a month, will restart and recheck in 10-12 weeks  Depression with anxiety Patient has just lost her mother. Her mom died of small cell lung cancer a couple of months ago. She did relatively well although she still grieving. Her mom was in hospice in [place] and she is encouraged to try to increase support group at hospice here in town. May continue to use alprazolam when necessary although fortunately she is using it infrequently.

## 2012-10-06 NOTE — Patient Instructions (Addendum)
Labs prior to visit lipid, renal, hepatic, cbc, tsh, free T4  Avoid trans fats/partially hydrogenated oils Add Krill oil such as MegaRed caps daily  DASH Diet The DASH diet stands for "Dietary Approaches to Stop Hypertension." It is a healthy eating plan that has been shown to reduce high blood pressure (hypertension) in as little as 14 days, while also possibly providing other significant health benefits. These other health benefits include reducing the risk of breast cancer after menopause and reducing the risk of type 2 diabetes, heart disease, colon cancer, and stroke. Health benefits also include weight loss and slowing kidney failure in patients with chronic kidney disease.  DIET GUIDELINES  Limit salt (sodium). Your diet should contain less than 1500 mg of sodium daily.  Limit refined or processed carbohydrates. Your diet should include mostly whole grains. Desserts and added sugars should be used sparingly.  Include small amounts of heart-healthy fats. These types of fats include nuts, oils, and tub margarine. Limit saturated and trans fats. These fats have been shown to be harmful in the body. CHOOSING FOODS  The following food groups are based on a 2000 calorie diet. See your Registered Dietitian for individual calorie needs. Grains and Grain Products (6 to 8 servings daily)  Eat More Often: Whole-wheat bread, brown rice, whole-grain or wheat pasta, quinoa, popcorn without added fat or salt (air popped).  Eat Less Often: White bread, white pasta, white rice, cornbread. Vegetables (4 to 5 servings daily)  Eat More Often: Fresh, frozen, and canned vegetables. Vegetables may be raw, steamed, roasted, or grilled with a minimal amount of fat.  Eat Less Often/Avoid: Creamed or fried vegetables. Vegetables in a cheese sauce. Fruit (4 to 5 servings daily)  Eat More Often: All fresh, canned (in natural juice), or frozen fruits. Dried fruits without added sugar. One hundred percent fruit  juice ( cup [237 mL] daily).  Eat Less Often: Dried fruits with added sugar. Canned fruit in light or heavy syrup. [employer], Fish, and Poultry (2 servings or less daily. One serving is 3 to 4 oz [85-114 g]).  Eat More Often: Ninety percent or leaner ground beef, tenderloin, sirloin. Round cuts of beef, chicken breast, [place] breast. All fish. Grill, bake, or broil your meat. Nothing should be fried.  Eat Less Often/Avoid: Fatty cuts of meat, [place], or chicken leg, thigh, or wing. Fried cuts of meat or fish. Dairy (2 to 3 servings)  Eat More Often: Low-fat or fat-free milk, low-fat plain or light yogurt, reduced-fat or part-skim cheese.  Eat Less Often/Avoid: Milk (whole, 2%).Whole milk yogurt. Full-fat cheeses. Nuts, Seeds, and Legumes (4 to 5 servings per week)  Eat More Often: All without added salt.  Eat Less Often/Avoid: Salted nuts and seeds, canned beans with added salt. Fats and Sweets (limited)  Eat More Often: Vegetable oils, tub margarines without trans fats, sugar-free gelatin. Mayonnaise and salad dressings.  Eat Less Often/Avoid: Coconut oils, palm oils, butter, stick margarine, cream, half and half, cookies, candy, pie. FOR MORE INFORMATION The Dash Diet Eating Plan: www.dashdiet.org Document Released: 05/10/2011 Document Revised: 08/13/2011 Document Reviewed: 05/10/2011 [facility] Patient Information 2013 [place], [place].    Grief Reaction Grief is a normal response to the death of someone close to you. Feelings of fear, anger, and guilt can affect almost everyone who loses someone they love. Symptoms of depression are also common. These include problems with sleep, loss of appetite, and lack of energy. These grief reaction symptoms often last for weeks to months after  a loss. They may also return during special times that remind you of the person you lost, such as an anniversary or birthday. Anxiety, insomnia, irritability, and deep depression may last beyond the  period of normal grief. If you experience these feelings for 6 months or longer, you may have clinical depression. Clinical depression requires further medical attention. If you think that you have clinical depression, you should contact your caregiver. If you have a history of depression and or a family history of depression, you are at greater risk of clinical depression. You are also at greater risk of developing clinical depression if the loss was traumatic or the loss was of someone with whom you had unresolved issues.  A grief reaction can become complicated by being blocked. This means being unable to cry or express extreme emotions. This may prolong the grieving period and worsen the emotional effects of the loss. Mourning is a natural event in human life. A healthy grief reaction is one that is not blocked . It requires a time of sadness and readjustment.It is very important to share your sorrow and fear with others, especially close friends and family. Professional counselors and clergy can also help you process your grief. Document Released: 05/21/2005 Document Revised: 08/13/2011 Document Reviewed: 01/29/2006 [facility] Patient Information 2013 [place], [place].

## 2012-10-06 NOTE — Assessment & Plan Note (Addendum)
Patient has just lost her mother. Her mom died of small cell lung cancer a couple of months ago. She did relatively well although she still grieving. Her mom was in hospice in [place] and she is encouraged to try to increase support group at hospice here in town. May continue to use alprazolam when necessary although fortunately she is using it infrequently.

## 2012-10-06 NOTE — Assessment & Plan Note (Signed)
Has not taken her Levothyroxine for over a month, will restart and recheck in 10-12 weeks

## 2012-10-06 NOTE — Assessment & Plan Note (Signed)
Recent Migraine had her end up in the ER, resolved now. Encouraged small, frequent meals with good hydration. Try Excedrin Migraine prn, continue to use Ibuprofen. Has not tried Axert

## 2012-10-07 ENCOUNTER — Ambulatory Visit: Payer: BC Managed Care – PPO | Admitting: Family Medicine

## 2012-11-05 ENCOUNTER — Telehealth: Payer: Self-pay | Admitting: Family Medicine

## 2012-11-05 DIAGNOSIS — E039 Hypothyroidism, unspecified: Secondary | ICD-10-CM

## 2012-11-05 DIAGNOSIS — F329 Major depressive disorder, single episode, unspecified: Secondary | ICD-10-CM

## 2012-11-05 MED ORDER — CITALOPRAM HYDROBROMIDE 20 MG PO TABS: 20.0000 mg | ORAL_TABLET | Freq: Every day | ORAL | Status: DC

## 2012-11-05 MED ORDER — LEVOTHYROXINE SODIUM 50 MCG PO TABS: 50.0000 ug | ORAL_TABLET | ORAL | Status: DC

## 2012-11-05 NOTE — Telephone Encounter (Signed)
90 day request  Levothyroxine   citalopram

## 2013-01-05 ENCOUNTER — Telehealth: Payer: Self-pay

## 2013-01-05 DIAGNOSIS — E039 Hypothyroidism, unspecified: Secondary | ICD-10-CM

## 2013-01-05 DIAGNOSIS — Z0000 Encounter for general adult medical examination without abnormal findings: Secondary | ICD-10-CM

## 2013-01-05 LAB — CBC
MCHC: 34.5 g/dL (ref 30.0–36.0)
Platelets: 316 10*3/uL (ref 150–400)
RDW: 14.4 % (ref 11.5–15.5)
WBC: 7.3 10*3/uL (ref 4.0–10.5)

## 2013-01-05 LAB — HEPATIC FUNCTION PANEL
ALT: 22 U/L (ref 0–35)
AST: 19 U/L (ref 0–37)
Albumin: 4 g/dL (ref 3.5–5.2)
Alkaline Phosphatase: 66 U/L (ref 39–117)
Total Protein: 6.8 g/dL (ref 6.0–8.3)

## 2013-01-05 LAB — LIPID PANEL
Cholesterol: 229 mg/dL — ABNORMAL HIGH (ref 0–200)
HDL: 48 mg/dL (ref >39)
LDL Cholesterol: 145 mg/dL — ABNORMAL HIGH (ref 0–99)
Triglycerides: 179 mg/dL — ABNORMAL HIGH (ref <150)

## 2013-01-05 LAB — RENAL FUNCTION PANEL
Albumin: 4 g/dL (ref 3.5–5.2)
BUN: 13 mg/dL (ref 6–23)
CO2: 28 mEq/L (ref 19–32)
Chloride: 104 mEq/L (ref 96–112)
Creat: 0.72 mg/dL (ref 0.50–1.10)
Glucose, Bld: 102 mg/dL — ABNORMAL HIGH (ref 70–99)

## 2013-01-05 LAB — T4, FREE: Free T4: 0.89 ng/dL (ref 0.80–1.80)

## 2013-01-05 NOTE — Telephone Encounter (Signed)
Lab order placed.

## 2013-01-07 ENCOUNTER — Ambulatory Visit: Payer: BC Managed Care – PPO | Admitting: Family Medicine

## 2013-01-08 ENCOUNTER — Ambulatory Visit (INDEPENDENT_AMBULATORY_CARE_PROVIDER_SITE_OTHER): Payer: Managed Care, Other (non HMO) | Admitting: Family Medicine

## 2013-01-08 ENCOUNTER — Encounter: Payer: Self-pay | Admitting: Family Medicine

## 2013-01-08 VITALS — BP 110/78 | HR 72 | Temp 98.0°F | Resp 16 | Ht 65.0 in | Wt 226.0 lb

## 2013-01-08 DIAGNOSIS — M25519 Pain in unspecified shoulder: Secondary | ICD-10-CM

## 2013-01-08 DIAGNOSIS — G43909 Migraine, unspecified, not intractable, without status migrainosus: Secondary | ICD-10-CM

## 2013-01-08 DIAGNOSIS — M25511 Pain in right shoulder: Secondary | ICD-10-CM | POA: Insufficient documentation

## 2013-01-08 DIAGNOSIS — E039 Hypothyroidism, unspecified: Secondary | ICD-10-CM

## 2013-01-08 DIAGNOSIS — F341 Dysthymic disorder: Secondary | ICD-10-CM

## 2013-01-08 DIAGNOSIS — F418 Other specified anxiety disorders: Secondary | ICD-10-CM

## 2013-01-08 DIAGNOSIS — R002 Palpitations: Secondary | ICD-10-CM

## 2013-01-08 DIAGNOSIS — R0602 Shortness of breath: Secondary | ICD-10-CM

## 2013-01-08 DIAGNOSIS — M542 Cervicalgia: Secondary | ICD-10-CM

## 2013-01-08 MED ORDER — CYCLOBENZAPRINE HCL 10 MG PO TABS: 10.0000 mg | ORAL_TABLET | Freq: Two times a day (BID) | ORAL | Status: DC | PRN

## 2013-01-08 MED ORDER — TRAMADOL HCL 50 MG PO TABS: 50.0000 mg | ORAL_TABLET | Freq: Three times a day (TID) | ORAL | Status: DC | PRN

## 2013-01-08 NOTE — Assessment & Plan Note (Signed)
Has an appt with orthopaedics in a few days.

## 2013-01-08 NOTE — Patient Instructions (Signed)
Palpitations  A palpitation is the feeling that your heartbeat is irregular or is faster than normal. It may feel like your heart is fluttering or skipping a beat. Palpitations are usually not a serious problem. However, in some cases, you may need further medical evaluation. CAUSES  Palpitations can be caused by:  Smoking.  Caffeine or other stimulants, such as diet pills or energy drinks.  Alcohol.  Stress and anxiety.  Strenuous physical activity.  Fatigue.  Certain medicines.  Heart disease, especially if you have a history of arrhythmias. This includes atrial fibrillation, atrial flutter, or supraventricular tachycardia.  An improperly working pacemaker or defibrillator. DIAGNOSIS  To find the cause of your palpitations, your caregiver will take your history and perform a physical exam. Tests may also be done, including:  Electrocardiography (ECG). This test records the heart's electrical activity.  Cardiac monitoring. This allows your caregiver to monitor your heart rate and rhythm in real time.  Holter monitor. This is a portable device that records your heartbeat and can help diagnose heart arrhythmias. It allows your caregiver to track your heart activity for several days, if needed.  Stress tests by exercise or by giving medicine that makes the heart beat faster. TREATMENT  Treatment of palpitations depends on the cause of your symptoms and can vary greatly. Most cases of palpitations do not require any treatment other than time, relaxation, and monitoring your symptoms. Other causes, such as atrial fibrillation, atrial flutter, or supraventricular tachycardia, usually require further treatment. HOME CARE INSTRUCTIONS   Avoid:  Caffeinated coffee, tea, soft drinks, diet pills, and energy drinks.  Chocolate.  Alcohol.  Stop smoking if you smoke.  Reduce your stress and anxiety. Things that can help you relax include:  A method that measures bodily functions so  you can learn to control them (biofeedback).  Yoga.  Meditation.  Physical activity such as swimming, jogging, or walking.  Get plenty of rest and sleep. SEEK MEDICAL CARE IF:   You continue to have a fast or irregular heartbeat beyond 24 hours.  Your palpitations occur more often. SEEK IMMEDIATE MEDICAL CARE IF:  You develop chest pain or shortness of breath.  You have a severe headache.  You feel dizzy, or you faint. MAKE SURE YOU:  Understand these instructions.  Will watch your condition.  Will get help right away if you are not doing well or get worse. Document Released: 05/18/2000 Document Revised: 11/20/2011 Document Reviewed: 07/20/2011 Exit[employer] Patient Information 2014 [employer].  

## 2013-01-11 ENCOUNTER — Encounter: Payer: Self-pay | Admitting: Family Medicine

## 2013-01-11 DIAGNOSIS — R002 Palpitations: Secondary | ICD-10-CM | POA: Insufficient documentation

## 2013-01-11 NOTE — Assessment & Plan Note (Signed)
Denies any recent acute or stressful situations. Feels the Citalopram is helping. No changes at this time

## 2013-01-11 NOTE — Assessment & Plan Note (Signed)
Has long history of palpitations usually infrequent and short lived. Recent episode lasted for several hours from 7 am to 3 pm and she felt sob as well. EKG normal today, will refer to cardiology for further consideration and she will report further symptoms

## 2013-01-11 NOTE — Progress Notes (Signed)
Patient ID: SIMRAN BOMKAMP, female   DOB: June 11, 1965, 47 y.o.   MRN: [id number] TAREKA JHAVERI [id number] 14-Jan-1966 01/11/2013      Progress Note-Follow Up  Subjective  Chief Complaint  Chief Complaint  Patient presents with  . Hypothyroidism    Pt here for 3 month follow up.  . Depression  . Shoulder Pain    Pt reports right shoulder and arm pain after incident while walking her dog. Sees orthopedic surgeon next week. Has now started having headaches.  . Palpitations    Pt reports increased episodes of irregular and rapid pulse rate. Recently episode lasted eight hours.    HPI  Patient is a 47 year old Caucasian female in today for followup. She's having significant right shoulder pain which is not resolving. It started with an in-stent with her.while she was watching him and is not improving. No radicular symptoms or weakness. No warmth or redness. She has an appointment with orthopedics within the week. Her bigger concern is palpitations. She's had to come off and on for many years. As a teen she had episodes of palpitations and was initially told she had a bicuspid aortic valve but eventually with further studies with. She had an episode this past week where she was awoken 7 AM palpitations and shortness of breath and while her usual episodes resolved quickly this will last until 3 PM and left her exhausted. She's had a couple of other episodes of awakening with palpitations they're usually short-lived and without associated symptoms. No chest pain or syncope no recent illness or fevers  Past Medical History  Diagnosis Date  . Hypothyroidism   . GERD (gastroesophageal reflux disease)   . Depression   . PONV (postoperative nausea and vomiting)   . Hypertension     only with preg. 18yrs ago 2002- no meds  . Dysrhythmia     some occ a-fib, no meds  . Headache(784.0)     otc meds prn, migraine  . Chicken pox as a child  . Cancer     skin  . Preventative health care  06/13/2012  . BCC (basal cell carcinoma), face     skin   . Hypothyroid 06/15/2012  . Depression with anxiety 10/06/2012  . Right shoulder pain 01/08/2013  . Neck pain 01/08/2013  . Palpitations 01/11/2013    Past Surgical History  Procedure Laterality Date  . Tonsillectomy    . Cesarean section  2002  . Laparoscopic tubal ligation  01/03/2011    Procedure: LAPAROSCOPIC TUBAL LIGATION;  Surgeon: Zelphia Cairo;  Location: [facility];  Service: Gynecology;  Laterality: Bilateral;  with filshie clips  . Tubal ligation    . Abdominal hysterectomy  03/04/2012    Procedure: HYSTERECTOMY ABDOMINAL;  Surgeon: Zelphia Cairo, MD;  Location: [facility];  Service: Gynecology;  Laterality: N/A;  with lysis of adhesions  . Laparoscopy  03/04/2012    Procedure: LAPAROSCOPY DIAGNOSTIC;  Surgeon: Zelphia Cairo, MD;  Location: [facility];  Service: Gynecology;  Laterality: N/A;  with lysis of adhesions    Family History  Problem Relation Age of Onset  . Cancer Mother     lung (06-13-11) breast -10 years ago  . Hypertension Mother   . Depression Mother   . Cancer Brother     skin  . Other Maternal Grandmother     spinal stenosis  . Cancer Maternal Grandfather     prostate  . Emphysema Maternal Grandfather     History   Social History  .  Marital Status: Married    Spouse Name: N/A    Number of Children: N/A  . Years of Education: N/A   Occupational History  . Not on file.   Social History Main Topics  . Smoking status: Never Smoker   . Smokeless tobacco: Never Used  . Alcohol Use: Yes     Comment: occasionally   . Drug Use: No  . Sexually Active: Yes    Birth Control/ Protection: Surgical   Other Topics Concern  . Not on file   Social History Narrative  . No narrative on file    Current Outpatient Prescriptions on File Prior to Visit  Medication Sig Dispense Refill  . almotriptan (AXERT) 12.5 MG tablet Take 1 tablet (12.5 mg total) by mouth as needed for migraine. may repeat in 2 hours if  needed  10 tablet  0  . ALPRAZolam (XANAX) 0.25 MG tablet 1/2 to 2 tabs po bid prn anxiety and/or insomnia  40 tablet  1  . citalopram (CELEXA) 20 MG tablet Take 1 tablet (20 mg total) by mouth daily.  90 tablet  1  . ibuprofen (ADVIL,MOTRIN) 600 MG tablet Take 1 tablet (600 mg total) by mouth every 6 (six) hours as needed.  30 tablet  2  . levothyroxine (SYNTHROID, LEVOTHROID) 50 MCG tablet Take 1 tablet (50 mcg total) by mouth every morning.  90 tablet  1  . ranitidine (ZANTAC) 150 MG tablet Take 150 mg by mouth at bedtime.         No current facility-administered medications on file prior to visit.    No Known Allergies  Review of Systems  Review of Systems  Constitutional: Negative for fever and malaise/fatigue.  HENT: Negative for congestion.   Eyes: Negative for discharge.  Respiratory: Positive for shortness of breath.   Cardiovascular: Positive for chest pain. Negative for palpitations and leg swelling.  Gastrointestinal: Negative for nausea, abdominal pain and diarrhea.  Genitourinary: Negative for dysuria.  Musculoskeletal: Positive for joint pain. Negative for falls.       Right shoulder pain  Skin: Negative for rash.  Neurological: Negative for loss of consciousness and headaches.  Endo/Heme/Allergies: Negative for polydipsia.  Psychiatric/Behavioral: Negative for depression and suicidal ideas. The patient is not nervous/anxious and does not have insomnia.     Objective  BP 110/78  Pulse 72  Temp(Src) 98 F (36.7 C) (Oral)  Resp 16  Ht 5\' 5"  (1.651 m)  Wt 226 lb (102.513 kg)  BMI 37.61 kg/m2  SpO2 97%  LMP 12/15/2010  Physical Exam  Physical Exam  Constitutional: She is oriented to person, place, and time and well-developed, well-nourished, and in no distress. No distress.  HENT:  Head: Normocephalic and atraumatic.  Eyes: Conjunctivae are normal.  Neck: Neck supple. No thyromegaly present.  Cardiovascular: Normal rate and regular rhythm.  Exam reveals  no gallop.   No murmur heard. Pulmonary/Chest: Effort normal and breath sounds normal. She has no wheezes.  Abdominal: She exhibits no distension and no mass.  Musculoskeletal: She exhibits no edema.  Lymphadenopathy:    She has no cervical adenopathy.  Neurological: She is alert and oriented to person, place, and time.  Skin: Skin is warm and dry. No rash noted. She is not diaphoretic.  Psychiatric: Memory, affect and judgment normal.    Lab Results  Component Value Date   TSH 4.260 01/05/2013   Lab Results  Component Value Date   WBC 7.3 01/05/2013   HGB 12.9 01/05/2013  HCT 37.4 01/05/2013   MCV 80.1 01/05/2013   PLT 316 01/05/2013   Lab Results  Component Value Date   CREATININE 0.72 01/05/2013   BUN 13 01/05/2013   NA 138 01/05/2013   K 4.3 01/05/2013   CL 104 01/05/2013   CO2 28 01/05/2013   Lab Results  Component Value Date   ALT 22 01/05/2013   AST 19 01/05/2013   ALKPHOS 66 01/05/2013   BILITOT 0.3 01/05/2013   Lab Results  Component Value Date   CHOL 229* 01/05/2013   Lab Results  Component Value Date   HDL 48 01/05/2013   Lab Results  Component Value Date   LDLCALC 145* 01/05/2013   Lab Results  Component Value Date   TRIG 179* 01/05/2013   Lab Results  Component Value Date   CHOLHDL 4.8 01/05/2013     Assessment & Plan  Right shoulder pain Has an appt with orthopaedics in a few days.  Hypothyroid Thyroid studies WNL, no changes  Palpitations Has long history of palpitations usually infrequent and short lived. Recent episode lasted for several hours from 7 am to 3 pm and she felt sob as well. EKG normal today, will refer to cardiology for further consideration and she will report further symptoms  Depression with anxiety Denies any recent acute or stressful situations. Feels the Citalopram is helping. No changes at this time

## 2013-01-11 NOTE — Assessment & Plan Note (Signed)
Thyroid studies WNL, no changes

## 2013-01-21 ENCOUNTER — Ambulatory Visit (INDEPENDENT_AMBULATORY_CARE_PROVIDER_SITE_OTHER): Payer: Managed Care, Other (non HMO) | Admitting: Cardiology

## 2013-01-21 ENCOUNTER — Encounter: Payer: Self-pay | Admitting: Cardiology

## 2013-01-21 VITALS — BP 120/86 | HR 75 | Ht 65.0 in | Wt 224.0 lb

## 2013-01-21 DIAGNOSIS — R0609 Other forms of dyspnea: Secondary | ICD-10-CM

## 2013-01-21 DIAGNOSIS — R002 Palpitations: Secondary | ICD-10-CM

## 2013-01-21 DIAGNOSIS — R0600 Dyspnea, unspecified: Secondary | ICD-10-CM

## 2013-01-21 NOTE — Progress Notes (Signed)
HPI: 47 year old female for evaluation of palpitations and dyspnea. Laboratories in August of 2014 showed a normal potassium of 4.3, BUN and creatinine 13/0.72, normal liver functions, hemoglobin 12.9, and TSH 4.260. Patient states that she has had intermittent palpitations since she was a teenager. They are sudden in onset and described as her heart racing. They're not related to activity. There is associated dyspnea but no chest pain or syncope. Her episodes have become more frequent and recently she has had 2 episodes that are more prolonged. She otherwise does not have dyspnea on exertion, orthopnea, PND, pedal edema or exertional chest pain. Because of the above we were asked to evaluate. Also note she was told she had a bicuspid aortic valve in the past. However followup with a cardiologist at the time of her pregnancy and repeat echocardiogram did not demonstrate this.  Current Outpatient Prescriptions  Medication Sig Dispense Refill  . almotriptan (AXERT) 12.5 MG tablet Take 1 tablet (12.5 mg total) by mouth as needed for migraine. may repeat in 2 hours if needed  10 tablet  0  . ALPRAZolam (XANAX) 0.25 MG tablet 1/2 to 2 tabs po bid prn anxiety and/or insomnia  40 tablet  1  . citalopram (CELEXA) 20 MG tablet Take 1 tablet (20 mg total) by mouth daily.  90 tablet  1  . cyclobenzaprine (FLEXERIL) 10 MG tablet Take 1 tablet (10 mg total) by mouth 2 (two) times daily as needed for muscle spasms (can have 1/2 tab during the day).  40 tablet  1  . ibuprofen (ADVIL,MOTRIN) 600 MG tablet Take 1 tablet (600 mg total) by mouth every 6 (six) hours as needed.  30 tablet  2  . levothyroxine (SYNTHROID, LEVOTHROID) 50 MCG tablet Take 1 tablet (50 mcg total) by mouth every morning.  90 tablet  1  . ranitidine (ZANTAC) 150 MG tablet Take 150 mg by mouth at bedtime.        . traMADol (ULTRAM) 50 MG tablet Take 1 tablet (50 mg total) by mouth every 8 (eight) hours as needed for pain.  40 tablet  1   No  current facility-administered medications for this visit.    No Known Allergies  Past Medical History  Diagnosis Date  . GERD (gastroesophageal reflux disease)   . Hypertension     only with preg. 62yrs ago 2002- no meds  . Headache(784.0)     otc meds prn, migraine  . Chicken pox as a child  . Preventative health care 06/13/2012  . BCC (basal cell carcinoma), face     skin   . Hypothyroid 06/15/2012  . Depression with anxiety 10/06/2012  . Hyperlipidemia     Past Surgical History  Procedure Laterality Date  . Tonsillectomy    . Cesarean section  2002  . Laparoscopic tubal ligation  01/03/2011    Procedure: LAPAROSCOPIC TUBAL LIGATION;  Surgeon: Zelphia Cairo;  Location: [facility];  Service: Gynecology;  Laterality: Bilateral;  with filshie clips  . Abdominal hysterectomy  03/04/2012    Procedure: HYSTERECTOMY ABDOMINAL;  Surgeon: Zelphia Cairo, MD;  Location: [facility];  Service: Gynecology;  Laterality: N/A;  with lysis of adhesions  . Laparoscopy  03/04/2012    Procedure: LAPAROSCOPY DIAGNOSTIC;  Surgeon: Zelphia Cairo, MD;  Location: [facility];  Service: Gynecology;  Laterality: N/A;  with lysis of adhesions    History   Social History  . Marital Status: Married    Spouse Name: N/A    Number of Children: 1  .  Years of Education: N/A   Occupational History  .      House wife   Social History Main Topics  . Smoking status: Never Smoker   . Smokeless tobacco: Never Used  . Alcohol Use: Yes     Comment: occasionally   . Drug Use: No  . Sexual Activity: Yes    Birth Control/ Protection: Surgical   Other Topics Concern  . Not on file   Social History Narrative  . No narrative on file    Family History  Problem Relation Age of Onset  . Cancer Mother     lung (06-13-11) breast -10 years ago  . Hypertension Mother   . Depression Mother   . Cancer Brother     skin  . Other Maternal Grandmother     spinal stenosis  . Cancer Maternal Grandfather     prostate  .  Emphysema Maternal Grandfather     ROS: no fevers or chills, productive cough, hemoptysis, dysphasia, odynophagia, melena, hematochezia, dysuria, hematuria, rash, seizure activity, orthopnea, PND, pedal edema, claudication. Remaining systems are negative.  Physical Exam:   Blood pressure 120/86, pulse 75, height 5\' 5"  (1.651 m), weight 224 lb (101.606 kg), last menstrual period 12/15/2010.  General:  Well developed/well nourished in NAD Skin warm/dry Patient not depressed No peripheral clubbing Back-normal HEENT-normal/normal eyelids Neck supple/normal carotid upstroke bilaterally; no bruits; no JVD; no thyromegaly chest - CTA/ normal expansion CV - RRR/normal S1 and S2; no murmurs, rubs or gallops;  PMI nondisplaced Abdomen -NT/ND, no HSM, no mass, + bowel sounds, no bruit 2+ femoral pulses, no bruits Ext-no edema, chords, 2+ DP Neuro-grossly nonfocal  ECG 01/08/2013-sinus rhythm with no ST changes.

## 2013-01-21 NOTE — Assessment & Plan Note (Signed)
Echocardiogram to assess LV function. Question of bicuspid valve in the past but apparently followup echo was negative.

## 2013-01-21 NOTE — Patient Instructions (Addendum)
Your physician recommends that you schedule a follow-up appointment in: 8 WEEKS WITH DR CRENSHAW IN [place]  Your physician has requested that you have an echocardiogram. Echocardiography is a painless test that uses sound waves to create images of your heart. It provides your doctor with information about the size and shape of your heart and how well your heart's chambers and valves are working. This procedure takes approximately one hour. There are no restrictions for this procedure.   Your physician has recommended that you wear an event monitor. Event monitors are medical devices that record the heart's electrical activity. Doctors most often [name] these monitors to diagnose arrhythmias. Arrhythmias are problems with the speed or rhythm of the heartbeat. The monitor is a small, portable device. You can wear one while you do your normal daily activities. This is usually used to diagnose what is causing palpitations/syncope (passing out).

## 2013-01-21 NOTE — Assessment & Plan Note (Signed)
Patient sounds to potentially be having SVT. Plan [facility] to further evaluate. If SVT demonstrated we will add a beta blocker versus consideration of referral for ablation. Schedule echocardiogram to assess LV function.

## 2013-01-26 ENCOUNTER — Ambulatory Visit (HOSPITAL_COMMUNITY): Payer: Managed Care, Other (non HMO) | Attending: Cardiology | Admitting: Radiology

## 2013-01-26 ENCOUNTER — Encounter (INDEPENDENT_AMBULATORY_CARE_PROVIDER_SITE_OTHER): Payer: Managed Care, Other (non HMO)

## 2013-01-26 ENCOUNTER — Encounter: Payer: Self-pay | Admitting: *Deleted

## 2013-01-26 DIAGNOSIS — R0602 Shortness of breath: Secondary | ICD-10-CM | POA: Insufficient documentation

## 2013-01-26 DIAGNOSIS — R0600 Dyspnea, unspecified: Secondary | ICD-10-CM

## 2013-01-26 DIAGNOSIS — R002 Palpitations: Secondary | ICD-10-CM | POA: Insufficient documentation

## 2013-01-26 NOTE — Progress Notes (Signed)
Echocardiogram performed.  

## 2013-01-26 NOTE — Progress Notes (Signed)
Patient ID: Ebony [name], female   DOB: 11-02-1965, 47 y.o.   MRN: [id number] E-Cardio verite 30 day cardiac event monitor applied to patient.

## 2013-02-20 ENCOUNTER — Telehealth: Payer: Self-pay

## 2013-02-20 NOTE — Telephone Encounter (Signed)
Patient left a message stating that she currently takes 20 mg of Citalopram and it doesn't seem to be working. Pt lost her mother a few months ago and she just doesn't feel herself. Pt would like to know if this should be increased or add something?  Please advise?

## 2013-02-21 NOTE — Telephone Encounter (Signed)
So have her take 40 mg po daily and then come in in a couple weeks to discuss changes unless she feels she needs to be seen sooner. Increase her Citalopram to 20 mg tabs 2 tabs po daily, disp #60 with 2 rf

## 2013-02-23 MED ORDER — CITALOPRAM HYDROBROMIDE 20 MG PO TABS: 20.0000 mg | ORAL_TABLET | Freq: Two times a day (BID) | ORAL | Status: DC

## 2013-02-23 NOTE — Telephone Encounter (Signed)
Left a detailed message on patients vm and asked pt to call the office to schedule an appt.   RX sent to pharmacy

## 2013-03-03 ENCOUNTER — Telehealth: Payer: Self-pay | Admitting: *Deleted

## 2013-03-03 NOTE — Telephone Encounter (Signed)
Left message for pt to call, monitor reviewed by dr Jens Som shows sinus with brief PAF; schedule follow up office visit.

## 2013-03-03 NOTE — Telephone Encounter (Signed)
pt aware of results, Follow up scheduled  

## 2013-03-18 ENCOUNTER — Encounter: Payer: Managed Care, Other (non HMO) | Admitting: Cardiology

## 2013-03-18 NOTE — Progress Notes (Signed)
HPI: FU palpitations and dyspnea. Laboratories in August of 2014 showed a normal potassium of 4.3, BUN and creatinine 13/0.72, normal liver functions, hemoglobin 12.9, and TSH 4.260. Monitor in August of 2014 showed sinus rhythm with intermittent SVT. Echocardiogram in August 2014 showed normal LV function, mild left atrial enlargement and grade 2 diastolic dysfunction. Since I last saw her,    Current Outpatient Prescriptions  Medication Sig Dispense Refill  . almotriptan (AXERT) 12.5 MG tablet Take 1 tablet (12.5 mg total) by mouth as needed for migraine. may repeat in 2 hours if needed  10 tablet  0  . ALPRAZolam (XANAX) 0.25 MG tablet 1/2 to 2 tabs po bid prn anxiety and/or insomnia  40 tablet  1  . citalopram (CELEXA) 20 MG tablet Take 1 tablet (20 mg total) by mouth 2 (two) times daily.  60 tablet  2  . cyclobenzaprine (FLEXERIL) 10 MG tablet Take 1 tablet (10 mg total) by mouth 2 (two) times daily as needed for muscle spasms (can have 1/2 tab during the day).  40 tablet  1  . ibuprofen (ADVIL,MOTRIN) 600 MG tablet Take 1 tablet (600 mg total) by mouth every 6 (six) hours as needed.  30 tablet  2  . levothyroxine (SYNTHROID, LEVOTHROID) 50 MCG tablet Take 1 tablet (50 mcg total) by mouth every morning.  90 tablet  1  . ranitidine (ZANTAC) 150 MG tablet Take 150 mg by mouth at bedtime.        . traMADol (ULTRAM) 50 MG tablet Take 1 tablet (50 mg total) by mouth every 8 (eight) hours as needed for pain.  40 tablet  1   No current facility-administered medications for this visit.     Past Medical History  Diagnosis Date  . GERD (gastroesophageal reflux disease)   . Hypertension     only with preg. 90yrs ago 2002- no meds  . Headache(784.0)     otc meds prn, migraine  . Chicken pox as a child  . Preventative health care 06/13/2012  . BCC (basal cell carcinoma), face     skin   . Hypothyroid 06/15/2012  . Depression with anxiety 10/06/2012  . Hyperlipidemia     Past Surgical  History  Procedure Laterality Date  . Tonsillectomy    . Cesarean section  2002  . Laparoscopic tubal ligation  01/03/2011    Procedure: LAPAROSCOPIC TUBAL LIGATION;  Surgeon: Zelphia Cairo;  Location: [facility];  Service: Gynecology;  Laterality: Bilateral;  with filshie clips  . Abdominal hysterectomy  03/04/2012    Procedure: HYSTERECTOMY ABDOMINAL;  Surgeon: Zelphia Cairo, MD;  Location: [facility];  Service: Gynecology;  Laterality: N/A;  with lysis of adhesions  . Laparoscopy  03/04/2012    Procedure: LAPAROSCOPY DIAGNOSTIC;  Surgeon: Zelphia Cairo, MD;  Location: [facility];  Service: Gynecology;  Laterality: N/A;  with lysis of adhesions    History   Social History  . Marital Status: Married    Spouse Name: N/A    Number of Children: 1  . Years of Education: N/A   Occupational History  .      House wife   Social History Main Topics  . Smoking status: Never Smoker   . Smokeless tobacco: Never Used  . Alcohol Use: Yes     Comment: occasionally   . Drug Use: No  . Sexual Activity: Yes    Birth Control/ Protection: Surgical   Other Topics Concern  . Not on file  Social History Narrative  . No narrative on file    ROS: no fevers or chills, productive cough, hemoptysis, dysphasia, odynophagia, melena, hematochezia, dysuria, hematuria, rash, seizure activity, orthopnea, PND, pedal edema, claudication. Remaining systems are negative.  Physical Exam: Well-developed well-nourished in no acute distress.  Skin is warm and dry.  HEENT is normal.  Neck is supple.  Chest is clear to auscultation with normal expansion.  Cardiovascular exam is regular rate and rhythm.  Abdominal exam nontender or distended. No masses palpated. Extremities show no edema. neuro grossly intact       This encounter was created in error - please disregard.

## 2013-03-23 ENCOUNTER — Telehealth: Payer: Self-pay | Admitting: *Deleted

## 2013-03-23 NOTE — Telephone Encounter (Signed)
Spoke with pt, aware of monitor results. 

## 2013-03-23 NOTE — Telephone Encounter (Signed)
Left message for pt to call, monitor reviewed by dr Jens Som shows sinus with intermittent SVT. Schedule follow up ov.

## 2013-04-08 ENCOUNTER — Ambulatory Visit: Payer: Managed Care, Other (non HMO) | Admitting: Family Medicine

## 2013-04-09 ENCOUNTER — Telehealth: Payer: Self-pay | Admitting: Family Medicine

## 2013-04-09 ENCOUNTER — Ambulatory Visit (INDEPENDENT_AMBULATORY_CARE_PROVIDER_SITE_OTHER): Payer: Managed Care, Other (non HMO) | Admitting: Family Medicine

## 2013-04-09 ENCOUNTER — Other Ambulatory Visit: Payer: Self-pay

## 2013-04-09 ENCOUNTER — Other Ambulatory Visit: Payer: Self-pay | Admitting: Family Medicine

## 2013-04-09 ENCOUNTER — Encounter: Payer: Self-pay | Admitting: Family Medicine

## 2013-04-09 VITALS — BP 110/88 | HR 64 | Temp 97.9°F | Ht 65.0 in | Wt 228.0 lb

## 2013-04-09 DIAGNOSIS — F341 Dysthymic disorder: Secondary | ICD-10-CM

## 2013-04-09 DIAGNOSIS — F418 Other specified anxiety disorders: Secondary | ICD-10-CM

## 2013-04-09 DIAGNOSIS — E039 Hypothyroidism, unspecified: Secondary | ICD-10-CM

## 2013-04-09 DIAGNOSIS — R002 Palpitations: Secondary | ICD-10-CM

## 2013-04-09 DIAGNOSIS — F329 Major depressive disorder, single episode, unspecified: Secondary | ICD-10-CM

## 2013-04-09 DIAGNOSIS — Z23 Encounter for immunization: Secondary | ICD-10-CM

## 2013-04-09 DIAGNOSIS — Z0000 Encounter for general adult medical examination without abnormal findings: Secondary | ICD-10-CM

## 2013-04-09 MED ORDER — CITALOPRAM HYDROBROMIDE 40 MG PO TABS: 40.0000 mg | ORAL_TABLET | Freq: Every day | ORAL | Status: DC

## 2013-04-09 MED ORDER — ONETOUCH ULTRA SYSTEM W/DEVICE KIT: 1.0000 | PACK | Freq: Once | Status: DC

## 2013-04-09 NOTE — Telephone Encounter (Signed)
Labs prior to visit annual lipid, renal, cbc, tsh, hepatic   Patient has appointment 07/17/13 and will be going to [facility] lab

## 2013-04-09 NOTE — Patient Instructions (Signed)
Grief Reaction  Grief is a normal response to the death of someone close to you. Feelings of fear, anger, and guilt can affect almost everyone who loses someone they love. Symptoms of depression are also common. These include problems with sleep, loss of appetite, and lack of energy. These grief reaction symptoms often last for weeks to months after a loss. They may also return during special times that remind you of the person you lost, such as an anniversary or birthday.  Anxiety, insomnia, irritability, and deep depression may last beyond the period of normal grief. If you experience these feelings for 6 months or longer, you may have clinical depression. Clinical depression requires further medical attention. If you think that you have clinical depression, you should contact your caregiver. If you have a history of depression and or a family history of depression, you are at greater risk of clinical depression. You are also at greater risk of developing clinical depression if the loss was traumatic or the loss was of someone with whom you had unresolved issues.   A grief reaction can become complicated by being blocked. This means being unable to cry or express extreme emotions. This may prolong the grieving period and worsen the emotional effects of the loss. Mourning is a natural event in human life. A healthy grief reaction is one that is not blocked . It requires a time of sadness and readjustment.It is very important to share your sorrow and fear with others, especially close friends and family. Professional counselors and clergy can also help you process your grief.  Document Released: 05/21/2005 Document Revised: 08/13/2011 Document Reviewed: 01/29/2006  [employer] Patient Information 2014 [employer].

## 2013-04-10 NOTE — Telephone Encounter (Signed)
Lab orders entered

## 2013-04-12 ENCOUNTER — Encounter: Payer: Self-pay | Admitting: Family Medicine

## 2013-04-12 NOTE — Progress Notes (Signed)
Patient ID: Ebony [name], female   DOB: 03-16-1966, 47 y.o.   MRN: [id number] KAMIA INSALACO [id number] [birth year] 04/12/2013      Progress Note-Follow Up  Subjective  Chief Complaint  Chief Complaint  Patient presents with  . Follow-up    3 month  . Injections    flu    HPI  Patient is a 47 yo female in today for follow up on numerous medical concerns. No recent illness, cp, palp, sob, gi or gu c/o. Has had some mild episodes of light headedness recently. No syncope. No other neurologic complaints. No syncope/vision or hearing loss.  Past Medical History  Diagnosis Date  . GERD (gastroesophageal reflux disease)   . Hypertension     only with preg. 68yrs ago 2002- no meds  . Headache(784.0)     otc meds prn, migraine  . Chicken pox as a child  . Preventative health care 06/13/2012  . BCC (basal cell carcinoma), face     skin   . Hypothyroid 06/15/2012  . Depression with anxiety 10/06/2012  . Hyperlipidemia     Past Surgical History  Procedure Laterality Date  . Tonsillectomy    . Cesarean section  2002  . Laparoscopic tubal ligation  01/03/2011    Procedure: LAPAROSCOPIC TUBAL LIGATION;  Surgeon: Zelphia Cairo;  Location: [facility];  Service: Gynecology;  Laterality: Bilateral;  with filshie clips  . Abdominal hysterectomy  03/04/2012    Procedure: HYSTERECTOMY ABDOMINAL;  Surgeon: Zelphia Cairo, MD;  Location: [facility];  Service: Gynecology;  Laterality: N/A;  with lysis of adhesions  . Laparoscopy  03/04/2012    Procedure: LAPAROSCOPY DIAGNOSTIC;  Surgeon: Zelphia Cairo, MD;  Location: [facility];  Service: Gynecology;  Laterality: N/A;  with lysis of adhesions    Family History  Problem Relation Age of Onset  . Cancer Mother     lung (06-13-11) breast -10 years ago  . Hypertension Mother   . Depression Mother   . Cancer Brother     skin  . Other Maternal Grandmother     spinal stenosis  . Cancer Maternal Grandfather     prostate  . Emphysema Maternal  Grandfather     History   Social History  . Marital Status: Married    Spouse Name: N/A    Number of Children: 1  . Years of Education: N/A   Occupational History  .      House wife   Social History Main Topics  . Smoking status: Never Smoker   . Smokeless tobacco: Never Used  . Alcohol Use: Yes     Comment: occasionally   . Drug Use: No  . Sexual Activity: Yes    Birth Control/ Protection: Surgical   Other Topics Concern  . Not on file   Social History Narrative  . No narrative on file    Current Outpatient Prescriptions on File Prior to Visit  Medication Sig Dispense Refill  . ALPRAZolam (XANAX) 0.25 MG tablet 1/2 to 2 tabs po bid prn anxiety and/or insomnia  40 tablet  1  . cyclobenzaprine (FLEXERIL) 10 MG tablet Take 1 tablet (10 mg total) by mouth 2 (two) times daily as needed for muscle spasms (can have 1/2 tab during the day).  40 tablet  1  . ibuprofen (ADVIL,MOTRIN) 600 MG tablet Take 1 tablet (600 mg total) by mouth every 6 (six) hours as needed.  30 tablet  2  . levothyroxine (SYNTHROID, LEVOTHROID) 50 MCG tablet Take 1 tablet (  50 mcg total) by mouth every morning.  90 tablet  1  . ranitidine (ZANTAC) 150 MG tablet Take 150 mg by mouth at bedtime.        . traMADol (ULTRAM) 50 MG tablet Take 1 tablet (50 mg total) by mouth every 8 (eight) hours as needed for pain.  40 tablet  1   No current facility-administered medications on file prior to visit.    No Known Allergies  Review of Systems  Review of Systems  Constitutional: Negative for fever and malaise/fatigue.  HENT: Negative for congestion.   Eyes: Negative for discharge.  Respiratory: Negative for shortness of breath.   Cardiovascular: Negative for chest pain, palpitations and leg swelling.  Gastrointestinal: Negative for nausea, abdominal pain and diarrhea.  Genitourinary: Negative for dysuria.  Musculoskeletal: Negative for falls.  Skin: Negative for rash.  Neurological: Negative for loss of  consciousness and headaches.  Endo/Heme/Allergies: Negative for polydipsia.  Psychiatric/Behavioral: Positive for depression. Negative for suicidal ideas. The patient is nervous/anxious. The patient does not have insomnia.     Objective  BP 110/88  Pulse 64  Temp(Src) 97.9 F (36.6 C) (Oral)  Ht 5\' 5"  (1.651 m)  Wt 228 lb 0.6 oz (103.438 kg)  BMI 37.95 kg/m2  SpO2 98%  LMP 12/15/2010  Physical Exam  Physical Exam  Constitutional: She is oriented to person, place, and time and well-developed, well-nourished, and in no distress. No distress.  HENT:  Head: Normocephalic and atraumatic.  Eyes: Conjunctivae are normal.  Neck: Neck supple. No thyromegaly present.  Cardiovascular: Normal rate, regular rhythm and normal heart sounds.   No murmur heard. Pulmonary/Chest: Effort normal and breath sounds normal. She has no wheezes.  Abdominal: She exhibits no distension and no mass.  Musculoskeletal: She exhibits no edema.  Lymphadenopathy:    She has no cervical adenopathy.  Neurological: She is alert and oriented to person, place, and time.  Skin: Skin is warm and dry. No rash noted. She is not diaphoretic.  Psychiatric: Memory, affect and judgment normal.    Lab Results  Component Value Date   TSH 4.260 01/05/2013   Lab Results  Component Value Date   WBC 7.3 01/05/2013   HGB 12.9 01/05/2013   HCT 37.4 01/05/2013   MCV 80.1 01/05/2013   PLT 316 01/05/2013   Lab Results  Component Value Date   CREATININE 0.72 01/05/2013   BUN 13 01/05/2013   NA 138 01/05/2013   K 4.3 01/05/2013   CL 104 01/05/2013   CO2 28 01/05/2013   Lab Results  Component Value Date   ALT 22 01/05/2013   AST 19 01/05/2013   ALKPHOS 66 01/05/2013   BILITOT 0.3 01/05/2013   Lab Results  Component Value Date   CHOL 229* 01/05/2013   Lab Results  Component Value Date   HDL 48 01/05/2013   Lab Results  Component Value Date   LDLCALC 145* 01/05/2013   Lab Results  Component Value Date   TRIG 179* 01/05/2013   Lab Results   Component Value Date   CHOLHDL 4.8 01/05/2013     Assessment & Plan  Hypothyroid Well treated on current dose of Synthroid.   Depression with anxiety Struggling still, increase Citalopram   Palpitations No complaints, avoid caffeine, given flu shot today

## 2013-04-12 NOTE — Assessment & Plan Note (Signed)
Struggling still, increase Citalopram

## 2013-04-12 NOTE — Assessment & Plan Note (Addendum)
No complaints, avoid caffeine, given flu shot today

## 2013-04-12 NOTE — Assessment & Plan Note (Signed)
Well treated on current dose of Synthroid.

## 2013-05-06 ENCOUNTER — Encounter: Payer: Self-pay | Admitting: Cardiology

## 2013-05-06 ENCOUNTER — Ambulatory Visit (INDEPENDENT_AMBULATORY_CARE_PROVIDER_SITE_OTHER): Payer: Managed Care, Other (non HMO) | Admitting: Cardiology

## 2013-05-06 VITALS — BP 140/80 | HR 66 | Wt 228.0 lb

## 2013-05-06 DIAGNOSIS — I471 Supraventricular tachycardia: Secondary | ICD-10-CM | POA: Insufficient documentation

## 2013-05-06 DIAGNOSIS — I498 Other specified cardiac arrhythmias: Secondary | ICD-10-CM

## 2013-05-06 DIAGNOSIS — R002 Palpitations: Secondary | ICD-10-CM

## 2013-05-06 MED ORDER — METOPROLOL SUCCINATE ER 25 MG PO TB24: 25.0000 mg | ORAL_TABLET | Freq: Every day | ORAL | Status: DC | PRN

## 2013-05-06 NOTE — Progress Notes (Signed)
HPI: Fu palpitations and dyspnea. Laboratories in August of 2014 showed a normal potassium of 4.3, BUN and creatinine 13/0.72, normal liver functions, hemoglobin 12.9, and TSH 4.260. She also apparently has been told she had a bicuspid aortic valve in the past. Echo in August of 2014 showed normal LV function, grade 2 diastolic dysfunction; mild LAE. Monitor in Oct of 2014 showed intermittent SVT. Since I last her, her palpitations persist but have improved. They are sudden in onset and described as her heart racing. There is some dyspnea but no chest pain and no history of syncope. They typically lasts 10-20 minutes and resolve spontaneously.   Current Outpatient Prescriptions  Medication Sig Dispense Refill  . almotriptan (AXERT) 12.5 MG tablet TAKE 1 TABLET (12.5 MG TOTAL) BY MOUTH AS NEEDED FOR MIGRAINE. MAY REPEAT IN 2 HOURS IF NEEDED.  10 tablet  0  . ALPRAZolam (XANAX) 0.25 MG tablet 1/2 to 2 tabs po bid prn anxiety and/or insomnia  40 tablet  1  . citalopram (CELEXA) 40 MG tablet Take 1 tablet (40 mg total) by mouth daily.  30 tablet  3  . cyclobenzaprine (FLEXERIL) 10 MG tablet Take 1 tablet (10 mg total) by mouth 2 (two) times daily as needed for muscle spasms (can have 1/2 tab during the day).  40 tablet  1  . ibuprofen (ADVIL,MOTRIN) 600 MG tablet Take 1 tablet (600 mg total) by mouth every 6 (six) hours as needed.  30 tablet  2  . levothyroxine (SYNTHROID, LEVOTHROID) 50 MCG tablet Take 1 tablet (50 mcg total) by mouth every morning.  90 tablet  1  . ranitidine (ZANTAC) 150 MG tablet Take 150 mg by mouth at bedtime.        . traMADol (ULTRAM) 50 MG tablet Take 1 tablet (50 mg total) by mouth every 8 (eight) hours as needed for pain.  40 tablet  1   No current facility-administered medications for this visit.     Past Medical History  Diagnosis Date  . GERD (gastroesophageal reflux disease)   . Hypertension     only with preg. 74yrs ago 2002- no meds  . Headache(784.0)       otc meds prn, migraine  . Chicken pox as a child  . Preventative health care 06/13/2012  . BCC (basal cell carcinoma), face     skin   . Hypothyroid 06/15/2012  . Depression with anxiety 10/06/2012  . Hyperlipidemia     Past Surgical History  Procedure Laterality Date  . Tonsillectomy    . Cesarean section  2002  . Laparoscopic tubal ligation  01/03/2011    Procedure: LAPAROSCOPIC TUBAL LIGATION;  Surgeon: Zelphia Cairo;  Location: [facility];  Service: Gynecology;  Laterality: Bilateral;  with filshie clips  . Abdominal hysterectomy  03/04/2012    Procedure: HYSTERECTOMY ABDOMINAL;  Surgeon: Zelphia Cairo, MD;  Location: [facility];  Service: Gynecology;  Laterality: N/A;  with lysis of adhesions  . Laparoscopy  03/04/2012    Procedure: LAPAROSCOPY DIAGNOSTIC;  Surgeon: Zelphia Cairo, MD;  Location: [facility];  Service: Gynecology;  Laterality: N/A;  with lysis of adhesions    History   Social History  . Marital Status: Married    Spouse Name: N/A    Number of Children: 1  . Years of Education: N/A   Occupational History  .      House wife   Social History Main Topics  . Smoking status: Never Smoker   . Smokeless  tobacco: Never Used  . Alcohol Use: Yes     Comment: occasionally   . Drug Use: No  . Sexual Activity: Yes    Birth Control/ Protection: Surgical   Other Topics Concern  . Not on file   Social History Narrative  . No narrative on file    ROS: no fevers or chills, productive cough, hemoptysis, dysphasia, odynophagia, melena, hematochezia, dysuria, hematuria, rash, seizure activity, orthopnea, PND, pedal edema, claudication. Remaining systems are negative.  Physical Exam: Well-developed well-nourished in no acute distress.  Skin is warm and dry.  HEENT is normal.  Neck is supple.  Chest is clear to auscultation with normal expansion.  Cardiovascular exam is regular rate and rhythm.  Abdominal exam nontender or distended. No masses palpated. Extremities show  no edema. neuro grossly intact  ECG sinus rhythm with no ST changes.

## 2013-05-06 NOTE — Patient Instructions (Signed)
Your physician wants you to follow-up in: 6 MONTHS WITH DR Jens Som You will receive a reminder letter in the mail two months in advance. If you don't receive a letter, please call our office to schedule the follow-up appointment.   TAKE METOPROLOL 25 MG ONCE DAILY AS NEEDED FOR PALPITATIONS

## 2013-05-06 NOTE — Assessment & Plan Note (Signed)
Patient has paroxysmal supraventricular tachycardia. Question AV nodal reentrant tachycardia. Her LV function is normal. We discussed the options today. She has had these episodes for 30 years. They are not particularly bothersome. I have instructed her to try Valsalva maneuvers to terminate these in the future. I have also given her a prescription for metoprolol 25 mg by mouth when necessary. We discussed daily medications but she would prefer to avoid this. She is also not interested in pursuing ablation at this point. We can consider in the future if her symptoms worsen.

## 2013-05-27 ENCOUNTER — Encounter: Payer: Self-pay | Admitting: Physician Assistant

## 2013-05-27 ENCOUNTER — Ambulatory Visit (INDEPENDENT_AMBULATORY_CARE_PROVIDER_SITE_OTHER): Payer: Managed Care, Other (non HMO) | Admitting: Physician Assistant

## 2013-05-27 VITALS — BP 126/86 | HR 74 | Temp 98.2°F | Resp 16 | Ht 65.0 in | Wt 230.0 lb

## 2013-05-27 DIAGNOSIS — M25551 Pain in right hip: Secondary | ICD-10-CM

## 2013-05-27 DIAGNOSIS — M25559 Pain in unspecified hip: Secondary | ICD-10-CM | POA: Insufficient documentation

## 2013-05-27 NOTE — Patient Instructions (Signed)
Complete steroid dose pack.  Rest.  Avoid lying on affected side.  Take Tramadol for pain.  Apply a topical Salon Pas or Aspercreme.  If symptoms have not improved within a week or if symptoms worsen, please return to the office as we will need to obtain imaging.  Once pain is resolved physical activity and weight loss will help prevent recurrence.

## 2013-05-27 NOTE — Progress Notes (Signed)
Patient ID: Ebony [name], female   DOB: 04-May-1966, 47 y.o.   MRN: [id number]  Patient presents to clinic today complaining of right hip pain has been present since last Tuesday. Patient was seen this weekend in urgent care and diagnosed with sciatica. Patient was given prescription for prednisone taper and Flexeril. Patient states symptoms have improved some but then began to worsen over the past day. Patient still has a couple days left of prednisone. Patient denies trauma or injury. Patient is overweight. Patient states the pain was throbbing in nature. Endorses some numbness of right hip. Denies saddle anesthesia. Denies radiation of pain into right lower extremity. Denies pain on left side. Patient has not been taking Flexeril.  Past Medical History  Diagnosis Date  . GERD (gastroesophageal reflux disease)   . Hypertension     only with preg. 78yrs ago 2002- no meds  . Headache(784.0)     otc meds prn, migraine  . Chicken pox as a child  . Preventative health care 06/13/2012  . BCC (basal cell carcinoma), face     skin   . Hypothyroid 06/15/2012  . Depression with anxiety 10/06/2012  . Hyperlipidemia     Current Outpatient Prescriptions on File Prior to Visit  Medication Sig Dispense Refill  . almotriptan (AXERT) 12.5 MG tablet TAKE 1 TABLET (12.5 MG TOTAL) BY MOUTH AS NEEDED FOR MIGRAINE. MAY REPEAT IN 2 HOURS IF NEEDED.  10 tablet  0  . ALPRAZolam (XANAX) 0.25 MG tablet 1/2 to 2 tabs po bid prn anxiety and/or insomnia  40 tablet  1  . citalopram (CELEXA) 40 MG tablet Take 1 tablet (40 mg total) by mouth daily.  30 tablet  3  . cyclobenzaprine (FLEXERIL) 10 MG tablet Take 1 tablet (10 mg total) by mouth 2 (two) times daily as needed for muscle spasms (can have 1/2 tab during the day).  40 tablet  1  . ibuprofen (ADVIL,MOTRIN) 600 MG tablet Take 1 tablet (600 mg total) by mouth every 6 (six) hours as needed.  30 tablet  2  . levothyroxine (SYNTHROID, LEVOTHROID) 50 MCG tablet Take 1  tablet (50 mcg total) by mouth every morning.  90 tablet  1  . metoprolol succinate (TOPROL XL) 25 MG 24 hr tablet Take 1 tablet (25 mg total) by mouth daily as needed.  30 tablet  12  . ranitidine (ZANTAC) 150 MG tablet Take 150 mg by mouth at bedtime.        . traMADol (ULTRAM) 50 MG tablet Take 1 tablet (50 mg total) by mouth every 8 (eight) hours as needed for pain.  40 tablet  1   No current facility-administered medications on file prior to visit.    No Known Allergies  Family History  Problem Relation Age of Onset  . Cancer Mother     lung (06-13-11) breast -10 years ago  . Hypertension Mother   . Depression Mother   . Cancer Brother     skin  . Other Maternal Grandmother     spinal stenosis  . Cancer Maternal Grandfather     prostate  . Emphysema Maternal Grandfather     History   Social History  . Marital Status: Married    Spouse Name: N/A    Number of Children: 1  . Years of Education: N/A   Occupational History  .      House wife   Social History Main Topics  . Smoking status: Never Smoker   . Smokeless  tobacco: Never Used  . Alcohol Use: Yes     Comment: occasionally   . Drug Use: No  . Sexual Activity: Yes    Birth Control/ Protection: Surgical   Other Topics Concern  . None   Social History Narrative  . None   Review of Systems - See HPI.  All other ROS are negative.  Filed Vitals:   05/27/13 0711  BP: 126/86  Pulse: 74  Temp: 98.2 F (36.8 C)  Resp: 16    Physical Exam  Vitals reviewed. Constitutional: She is oriented to person, place, and time.  Overweight, well-developed Caucasian female in no acute distress.  HENT:  Head: Normocephalic and atraumatic.  Eyes: Conjunctivae are normal. Pupils are equal, round, and reactive to light.  Neck: Neck supple.  Cardiovascular: Normal rate, regular rhythm and normal heart sounds.   Pulmonary/Chest: Effort normal and breath sounds normal.  Musculoskeletal:       Right hip: She exhibits  tenderness. She exhibits normal range of motion, normal strength, no bony tenderness, no swelling, no crepitus, no deformity and no laceration.       Left hip: Normal.       Right knee: Normal.       Right ankle: Normal.       Lumbar back: Normal.  Neurological: She is alert and oriented to person, place, and time. She has normal strength and normal reflexes. No cranial nerve deficit.  Positive for paresthesias of right lateral hip.  Skin: Skin is warm and dry. No rash noted.  Psychiatric: Affect normal.   Assessment/Plan: Hip pain, acute Doubt true sciatica as diagnosed by urgent care physician, giving lack or radiation of pain. Possible meralgia paresthetica. Patient to continue prednisone taper. Continue Flexeril. Tramadol for pain. Avoid lying on the affected side. Avoid heavy lifting. Followup in one to 2 weeks. We'll need to obtain imaging if symptoms not improving at that time.

## 2013-05-27 NOTE — Progress Notes (Signed)
Pre visit review using our clinic review tool, if applicable. No additional management support is needed unless otherwise documented below in the visit note. 

## 2013-05-27 NOTE — Assessment & Plan Note (Signed)
Doubt true sciatica as diagnosed by urgent care physician, giving lack or radiation of pain. Possible meralgia paresthetica. Patient to continue prednisone taper. Continue Flexeril. Tramadol for pain. Avoid lying on the affected side. Avoid heavy lifting. Followup in one to 2 weeks. We'll need to obtain imaging if symptoms not improving at that time.

## 2013-07-13 LAB — CBC
HCT: 38.5 % (ref 36.0–46.0)
Hemoglobin: 13.2 g/dL (ref 12.0–15.0)
MCH: 28.4 pg (ref 26.0–34.0)
MCHC: 34.3 g/dL (ref 30.0–36.0)
MCV: 82.8 fL (ref 78.0–100.0)
Platelets: 310 10*3/uL (ref 150–400)
RBC: 4.65 MIL/uL (ref 3.87–5.11)
RDW: 14.7 % (ref 11.5–15.5)
WBC: 8.1 10*3/uL (ref 4.0–10.5)

## 2013-07-13 LAB — HEPATIC FUNCTION PANEL
ALT: 15 U/L (ref 0–35)
AST: 18 U/L (ref 0–37)
Albumin: 4.4 g/dL (ref 3.5–5.2)
Alkaline Phosphatase: 61 U/L (ref 39–117)
Bilirubin, Direct: 0.1 mg/dL (ref 0.0–0.3)
Indirect Bilirubin: 0.2 mg/dL (ref 0.2–1.2)
Total Bilirubin: 0.3 mg/dL (ref 0.2–1.2)
Total Protein: 7 g/dL (ref 6.0–8.3)

## 2013-07-13 LAB — RENAL FUNCTION PANEL
Albumin: 4.4 g/dL (ref 3.5–5.2)
BUN: 16 mg/dL (ref 6–23)
CO2: 28 mEq/L (ref 19–32)
Calcium: 9.4 mg/dL (ref 8.4–10.5)
Chloride: 103 mEq/L (ref 96–112)
Creat: 0.8 mg/dL (ref 0.50–1.10)
Glucose, Bld: 104 mg/dL — ABNORMAL HIGH (ref 70–99)
Phosphorus: 3.9 mg/dL (ref 2.3–4.6)
Potassium: 4 mEq/L (ref 3.5–5.3)
Sodium: 140 mEq/L (ref 135–145)

## 2013-07-13 LAB — LIPID PANEL
Cholesterol: 200 mg/dL (ref 0–200)
HDL: 52 mg/dL (ref >39)
LDL Cholesterol: 114 mg/dL — ABNORMAL HIGH (ref 0–99)
Total CHOL/HDL Ratio: 3.8 Ratio
Triglycerides: 169 mg/dL — ABNORMAL HIGH (ref <150)
VLDL: 34 mg/dL (ref 0–40)

## 2013-07-14 LAB — TSH: TSH: 3.181 u[IU]/mL (ref 0.350–4.500)

## 2013-07-17 ENCOUNTER — Encounter: Payer: Self-pay | Admitting: Family Medicine

## 2013-07-17 ENCOUNTER — Ambulatory Visit (INDEPENDENT_AMBULATORY_CARE_PROVIDER_SITE_OTHER): Payer: Managed Care, Other (non HMO) | Admitting: Family Medicine

## 2013-07-17 VITALS — BP 108/80 | HR 87 | Temp 97.8°F | Resp 16 | Ht 65.0 in | Wt 236.2 lb

## 2013-07-17 DIAGNOSIS — M25519 Pain in unspecified shoulder: Secondary | ICD-10-CM

## 2013-07-17 DIAGNOSIS — F341 Dysthymic disorder: Secondary | ICD-10-CM

## 2013-07-17 DIAGNOSIS — I498 Other specified cardiac arrhythmias: Secondary | ICD-10-CM

## 2013-07-17 DIAGNOSIS — R5383 Other fatigue: Principal | ICD-10-CM

## 2013-07-17 DIAGNOSIS — F329 Major depressive disorder, single episode, unspecified: Secondary | ICD-10-CM

## 2013-07-17 DIAGNOSIS — E669 Obesity, unspecified: Secondary | ICD-10-CM

## 2013-07-17 DIAGNOSIS — F419 Anxiety disorder, unspecified: Secondary | ICD-10-CM

## 2013-07-17 DIAGNOSIS — I471 Supraventricular tachycardia: Secondary | ICD-10-CM

## 2013-07-17 DIAGNOSIS — R002 Palpitations: Secondary | ICD-10-CM

## 2013-07-17 DIAGNOSIS — R5381 Other malaise: Secondary | ICD-10-CM

## 2013-07-17 DIAGNOSIS — F418 Other specified anxiety disorders: Secondary | ICD-10-CM

## 2013-07-17 DIAGNOSIS — M25511 Pain in right shoulder: Secondary | ICD-10-CM

## 2013-07-17 MED ORDER — AMPHETAMINE-DEXTROAMPHETAMINE 10 MG PO TABS: 10.0000 mg | ORAL_TABLET | Freq: Two times a day (BID) | ORAL | Status: DC

## 2013-07-17 NOTE — Patient Instructions (Signed)
Supraventricular Tachycardia °Supraventricular tachycardia (SVT) is an abnormal heart rhythm (arrhythmia) that causes the heart to beat very fast (tachycardia). This kind of fast heartbeat originates in the upper chambers of the heart (atria). SVT can cause the heart to beat greater than 100 beats per minute. SVT can have a rapid burst of heartbeats. This can start and stop suddenly without warning and is called nonsustained. SVT can also be sustained, in which the heart beats at a continuous fast rate.  °CAUSES  °There can be different causes of SVT. Some of these include: °· Heart valve problems such as mitral valve prolapse. °· An enlarged heart (hypertrophic cardiomyopathy). °· Congenital heart problems. °· Heart inflammation (pericarditis). °· Hyperthyroidism. °· Low potassium or magnesium levels. °· Caffeine. °· Drug use such as cocaine, methamphetamines, or stimulants. °· Some over-the-counter medicines such as: °· Decongestants. °· Diet medicines. °· Herbal medicines. °SYMPTOMS  °Symptoms of SVT can vary. Symptoms depend on whether the SVT is sustained or nonsustained. You may experience: °· No symptoms (asymptomatic). °· An awareness of your heart beating rapidly (palpitations). °· Shortness of breath. °· Chest pain or pressure. °If your blood pressure drops because of the SVT, you may experience: °· Fainting or near fainting. °· Weakness. °· Dizziness. °DIAGNOSIS  °Different tests can be performed to diagnose SVT, such as: °· An electrocardiogram (EKG). This is a painless test that records the electrical activity of your heart. °· Holter monitor. This is a 24 hour recording of your heart rhythm. You will be given a diary. Write down all symptoms that you have and what you were doing at the time you experienced symptoms. °· Arrhythmia monitor. This is a small device that your wear for several weeks. It records the heart rhythm when you have symptoms. °· Echocardiogram. This is an imaging test to help detect  abnormal heart structure such as congenital abnormalities, heart valve problems, or heart enlargement. °· Stress test. This test can help determine if the SVT is related to exercise. °· Electrophysiology study (EPS). This is a procedure that evaluates your heart's electrical system and can help your caregiver find the cause of your SVT. °TREATMENT  °Treatment of SVT depends on the symptoms, how often it recurs, and whether there are any underlying heart problems.  °· If symptoms are rare and no other cardiac disease is present, no treatment may be needed. °· Blood work may be done to check potassium, magnesium, and thyroid hormone levels to see if they are abnormal. If these levels are abnormal, treatment to correct the problems will occur. °Medicines °Your caregiver may use oral medicines to treat SVT. These medicines are given for long-term control of SVT. Medicines may be used alone or in combination with other treatments. These medicines work to slow nerve impulses in the heart muscle. These medicines can also be used to treat high blood pressure. Some of these medicines may include: °· Calcium channel blockers. °· Beta blockers. °· Digoxin. °Nonsurgical procedures °Nonsurgical techniques may be used if oral medicines do not work. Some examples include: °· Cardioversion. This technique uses either drugs or an electrical shock to restore a normal heart rhythm. °· Cardioversion drugs may be given through an intravenous (IV) line to help "reset" the heart rhythm. °· In electrical cardioversion, the caregiver shocks your heart to stop its beat for a split second. This helps to reset the heart to a normal rhythm. °· Ablation. This procedure is done under mild sedation. High frequency radio wave energy is used to   destroy the area of heart tissue responsible for the SVT. °HOME CARE INSTRUCTIONS  °· Do not smoke. °· Only take medicines prescribed by your caregiver. Check with your caregiver before using over-the-counter  medicines. °· Check with your caregiver about how much alcohol and caffeine (coffee, tea, colas, or chocolate) you may have. °· It is very important to keep all follow-up referrals and appointments in order to properly manage this problem. °SEEK IMMEDIATE MEDICAL CARE IF: °· You have dizziness. °· You faint or nearly faint. °· You have shortness of breath. °· You have chest pain or pressure. °· You have sudden nausea or vomiting. °· You have profuse sweating. °· You are concerned about how long your symptoms last. °· You are concerned about the frequency of your SVT episodes. °If you have the above symptoms, call your local emergency services (911 in [place] immediately. Do not drive yourself to the hospital. °MAKE SURE YOU:  °· Understand these instructions. °· Will watch your condition. °· Will get help right away if you are not doing well or get worse. °Document Released: 05/21/2005 Document Revised: 08/13/2011 Document Reviewed: 09/02/2008 °[employer]® Patient Information ©2014 [employer]. ° °

## 2013-07-17 NOTE — Progress Notes (Signed)
Patient ID: Ebony [name], female   DOB: 06/14/1965, 48 y.o.   MRN: [id number] Ebony [name] [id number] 12/13/1965 07/17/2013      Progress Note-Follow Up  Subjective  Chief Complaint  Chief Complaint  Patient presents with  . Annual Exam    Labs done prior.    HPI  Patient is a 48 year old Caucasian female who is in today for annual exam. She is approaching the 1 year anniversary of her mother's death. She technologist being anxious and crying frequently at times but does believe it is improving. No suicidal ideation. No recent illness. No chest pain, palpitations or shortness of breath. The medications as prescribed.  Past Medical History  Diagnosis Date  . GERD (gastroesophageal reflux disease)   . Hypertension     only with preg. 20yrs ago 2002- no meds  . Headache(784.0)     otc meds prn, migraine  . Chicken pox as a child  . Preventative health care 06/13/2012  . BCC (basal cell carcinoma), face     skin   . Hypothyroid 06/15/2012  . Depression with anxiety 10/06/2012  . Hyperlipidemia     Past Surgical History  Procedure Laterality Date  . Tonsillectomy    . Cesarean section  2002  . Laparoscopic tubal ligation  01/03/2011    Procedure: LAPAROSCOPIC TUBAL LIGATION;  Surgeon: Marylynn Pearson;  Location: [facility];  Service: Gynecology;  Laterality: Bilateral;  with filshie clips  . Abdominal hysterectomy  03/04/2012    Procedure: HYSTERECTOMY ABDOMINAL;  Surgeon: Marylynn Pearson, MD;  Location: [facility];  Service: Gynecology;  Laterality: N/A;  with lysis of adhesions  . Laparoscopy  03/04/2012    Procedure: LAPAROSCOPY DIAGNOSTIC;  Surgeon: Marylynn Pearson, MD;  Location: [facility];  Service: Gynecology;  Laterality: N/A;  with lysis of adhesions    Family History  Problem Relation Age of Onset  . Cancer Mother     lung (06-13-11) breast -10 years ago  . Hypertension Mother   . Depression Mother   . Cancer Brother     skin  . Other Maternal Grandmother     spinal  stenosis  . Cancer Maternal Grandfather     prostate  . Emphysema Maternal Grandfather     History   Social History  . Marital Status: Married    Spouse Name: N/A    Number of Children: 1  . Years of Education: N/A   Occupational History  .      House wife   Social History Main Topics  . Smoking status: Never Smoker   . Smokeless tobacco: Never Used  . Alcohol Use: Yes     Comment: occasionally   . Drug Use: No  . Sexual Activity: Yes    Birth Control/ Protection: Surgical   Other Topics Concern  . Not on file   Social History Narrative  . No narrative on file    Current Outpatient Prescriptions on File Prior to Visit  Medication Sig Dispense Refill  . almotriptan (AXERT) 12.5 MG tablet TAKE 1 TABLET (12.5 MG TOTAL) BY MOUTH AS NEEDED FOR MIGRAINE. MAY REPEAT IN 2 HOURS IF NEEDED.  10 tablet  0  . ALPRAZolam (XANAX) 0.25 MG tablet 1/2 to 2 tabs po bid prn anxiety and/or insomnia  40 tablet  1  . citalopram (CELEXA) 40 MG tablet Take 1 tablet (40 mg total) by mouth daily.  30 tablet  3  . cyclobenzaprine (FLEXERIL) 10 MG tablet Take 1 tablet (10 mg total) by  mouth 2 (two) times daily as needed for muscle spasms (can have 1/2 tab during the day).  40 tablet  1  . ibuprofen (ADVIL,MOTRIN) 600 MG tablet Take 1 tablet (600 mg total) by mouth every 6 (six) hours as needed.  30 tablet  2  . levothyroxine (SYNTHROID, LEVOTHROID) 50 MCG tablet Take 1 tablet (50 mcg total) by mouth every morning.  90 tablet  1  . metoprolol succinate (TOPROL XL) 25 MG 24 hr tablet Take 1 tablet (25 mg total) by mouth daily as needed.  30 tablet  12  . ranitidine (ZANTAC) 150 MG tablet Take 150 mg by mouth at bedtime.        . traMADol (ULTRAM) 50 MG tablet Take 1 tablet (50 mg total) by mouth every 8 (eight) hours as needed for pain.  40 tablet  1   No current facility-administered medications on file prior to visit.    No Known Allergies  Review of Systems  Review of Systems   Constitutional: Positive for malaise/fatigue. Negative for fever.  HENT: Negative for congestion.   Eyes: Negative for discharge.  Respiratory: Negative for shortness of breath.   Cardiovascular: Negative for chest pain, palpitations and leg swelling.  Gastrointestinal: Negative for nausea, abdominal pain and diarrhea.  Genitourinary: Negative for dysuria.  Musculoskeletal: Negative for falls.  Skin: Negative for rash.  Neurological: Negative for loss of consciousness and headaches.  Endo/Heme/Allergies: Negative for polydipsia.  Psychiatric/Behavioral: Negative for depression and suicidal ideas. The patient is not nervous/anxious and does not have insomnia.     Objective  BP 108/80  Pulse 87  Temp(Src) 97.8 F (36.6 C) (Oral)  Resp 16  Ht 5\' 5"  (1.651 m)  Wt 236 lb 4 oz (107.162 kg)  BMI 39.31 kg/m2  SpO2 98%  LMP 12/15/2010  Physical Exam  Physical Exam  Constitutional: She is oriented to person, place, and time and well-developed, well-nourished, and in no distress. No distress.  HENT:  Head: Normocephalic and atraumatic.  Right Ear: External ear normal.  Left Ear: External ear normal.  Nose: Nose normal.  Mouth/Throat: Oropharynx is clear and moist. No oropharyngeal exudate.  Eyes: Conjunctivae are normal. Pupils are equal, round, and reactive to light. Right eye exhibits no discharge. Left eye exhibits no discharge. No scleral icterus.  Neck: Normal range of motion. Neck supple. No thyromegaly present.  Cardiovascular: Normal rate, regular rhythm, normal heart sounds and intact distal pulses.   No murmur heard. Pulmonary/Chest: Effort normal and breath sounds normal. No respiratory distress. She has no wheezes. She has no rales.  Abdominal: Soft. Bowel sounds are normal. She exhibits no distension and no mass. There is no tenderness.  Musculoskeletal: Normal range of motion. She exhibits no edema and no tenderness.  Lymphadenopathy:    She has no cervical  adenopathy.  Neurological: She is alert and oriented to person, place, and time. She has normal reflexes. No cranial nerve deficit. Coordination normal.  Skin: Skin is warm and dry. No rash noted. She is not diaphoretic.  Psychiatric: Mood, memory and affect normal.    Lab Results  Component Value Date   TSH 3.181 07/13/2013   Lab Results  Component Value Date   WBC 8.1 07/13/2013   HGB 13.2 07/13/2013   HCT 38.5 07/13/2013   MCV 82.8 07/13/2013   PLT 310 07/13/2013   Lab Results  Component Value Date   CREATININE 0.80 07/13/2013   BUN 16 07/13/2013   NA 140 07/13/2013   K 4.0  07/13/2013   CL 103 07/13/2013   CO2 28 07/13/2013   Lab Results  Component Value Date   ALT 15 07/13/2013   AST 18 07/13/2013   ALKPHOS 61 07/13/2013   BILITOT 0.3 07/13/2013   Lab Results  Component Value Date   CHOL 200 07/13/2013   Lab Results  Component Value Date   HDL 52 07/13/2013   Lab Results  Component Value Date   LDLCALC 114* 07/13/2013   Lab Results  Component Value Date   TRIG 169* 07/13/2013   Lab Results  Component Value Date   CHOLHDL 3.8 07/13/2013     Assessment & Plan  SVT (supraventricular tachycardia) Given Metoprolol to use prn   Right shoulder pain Is improving with PT encouraged topical NSAIDs and continue PT  Palpitations No recent episodes  Depression with anxiety Approaching the 1 year anniversary of her mother's death. She is doing better but still struggles at times. The increased Celexa was helpful

## 2013-07-17 NOTE — Progress Notes (Signed)
Pre visit review using our clinic review tool, if applicable. No additional management support is needed unless otherwise documented below in the visit note/SLS  

## 2013-07-17 NOTE — Assessment & Plan Note (Signed)
Given Metoprolol to use prn

## 2013-07-19 NOTE — Assessment & Plan Note (Signed)
No recent episodes

## 2013-07-19 NOTE — Assessment & Plan Note (Signed)
Is improving with PT encouraged topical NSAIDs and continue PT

## 2013-07-19 NOTE — Assessment & Plan Note (Signed)
Approaching the 1 year anniversary of her mother's death. She is doing better but still struggles at times. The increased Celexa was helpful

## 2013-08-14 ENCOUNTER — Ambulatory Visit: Payer: Managed Care, Other (non HMO) | Admitting: Family Medicine

## 2013-09-07 ENCOUNTER — Other Ambulatory Visit: Payer: Self-pay | Admitting: Family Medicine

## 2013-10-19 ENCOUNTER — Telehealth: Payer: Self-pay | Admitting: Cardiology

## 2013-10-19 DIAGNOSIS — R002 Palpitations: Secondary | ICD-10-CM

## 2013-10-19 MED ORDER — METOPROLOL SUCCINATE ER 50 MG PO TB24: 50.0000 mg | ORAL_TABLET | Freq: Every day | ORAL | Status: DC

## 2013-10-19 NOTE — Telephone Encounter (Signed)
Spoke with pt, Aware of dr crenshaw's recommendations.  Script sent to the pharm 

## 2013-10-19 NOTE — Telephone Encounter (Signed)
Change toprol to 50 mg daily and fu as scheduled Ebony [name]

## 2013-10-19 NOTE — Telephone Encounter (Signed)
New problem         Per pt :  Pt for the last 2 wks has been having SVT and Afib since Friday. Pt is very Fatigued. Pt called for an appointment 6/8 with PA Lenze.   Pt would like a call back on what she should do in the meantime or come in sooner if possible.

## 2013-10-19 NOTE — Telephone Encounter (Signed)
Spoke with pt, she has been having SVT and irregular heart beats off and on for about two weeks now. Since Friday she has been having irregular pulse. Her rate is 79. 2 weeks ago she had an episode of SVT that lasted 15 to 16 hours. She is not taking the metoprolol daily, she is uncertain to take it for SVT only or with these palpitations as well. She has a follow up scheduled in couple weeks but wants to know if there is anything to do in the meantime. Will forward for dr Stanford Breed review

## 2013-11-09 ENCOUNTER — Encounter: Payer: Self-pay | Admitting: Physician Assistant

## 2013-11-09 ENCOUNTER — Ambulatory Visit (INDEPENDENT_AMBULATORY_CARE_PROVIDER_SITE_OTHER): Payer: Managed Care, Other (non HMO) | Admitting: Physician Assistant

## 2013-11-09 VITALS — BP 110/90 | HR 71 | Ht 65.0 in | Wt 232.0 lb

## 2013-11-09 DIAGNOSIS — I471 Supraventricular tachycardia: Secondary | ICD-10-CM

## 2013-11-09 DIAGNOSIS — I10 Essential (primary) hypertension: Secondary | ICD-10-CM

## 2013-11-09 DIAGNOSIS — I498 Other specified cardiac arrhythmias: Secondary | ICD-10-CM

## 2013-11-09 NOTE — Assessment & Plan Note (Signed)
Recurrent SVT that has improved with daily metoprolol. Continue daily metoprolol. I told the patient she can take an extra metoprolol if she has recurrent SVT. I told her to come to the emergency room for prolonged episodes. She will followup with Dr. Stanford Breed and 4-6 months.

## 2013-11-09 NOTE — Patient Instructions (Addendum)
Your physician wants you to follow-up in: 4-6 WITH DR. CRENSHAW You will receive a reminder letter in the mail two months in advance. If you don't receive a letter, please call our office to schedule the follow-up appointment.  Your physician recommends that you continue on your current medications as directed. Please refer to the Current Medication list given to you today.

## 2013-11-09 NOTE — Progress Notes (Signed)
HPI:  This is a 48 year old female patient Dr. Stanford Breed who has history of SVT treated with prn beta blockers. 2-D echo in August 2014 showed normal LV function, grade 2 diastolic dysfunction, mild left atrial enlargement.   Patient had an episode of SVT that lasted over 15 hours. It started in the late afternoon and lasted through the night. She took her metoprolol but it didn't help. Her heart rate averaged 160 beats but got as high as 180/m. She had no associated dizziness, syncope, chest pain or shortness of breath. She called our office the next day and was told to take metoprolol on a daily basis. Since she's been doing this she feels so much better. She feels like she is resting better at night and has had no further SVT. She does have occasional palpitations which she says are just skips but no SVT. She had recent blood work in February by Dr. Randel Pigg that was all normal including a TSH. Patient denied any recent viral, dehydration, nausea vomiting or diarrhea, or excessive stress. She denied excess caffeine use. Her mother died 10 months ago and she's had more palpitations in the past 18 months but no recent stress.  No Known Allergies  Current Outpatient Prescriptions on File Prior to Visit: almotriptan (AXERT) 12.5 MG tablet, TAKE 1 TABLET (12.5 MG TOTAL) BY MOUTH AS NEEDED FOR MIGRAINE. MAY REPEAT IN 2 HOURS IF NEEDED., Disp: 10 tablet, Rfl: 0 ALPRAZolam (XANAX) 0.25 MG tablet, 1/2 to 2 tabs po bid prn anxiety and/or insomnia, Disp: 40 tablet, Rfl: 1 amphetamine-dextroamphetamine (ADDERALL) 10 MG tablet, Take 1 tablet (10 mg total) by mouth 2 (two) times daily., Disp: 60 tablet, Rfl: 0 citalopram (CELEXA) 40 MG tablet, TAKE 1 TABLET (40 MG TOTAL) BY MOUTH DAILY., Disp: 30 tablet, Rfl: 3 cyclobenzaprine (FLEXERIL) 10 MG tablet, Take 1 tablet (10 mg total) by mouth 2 (two) times daily as needed for muscle spasms (can have 1/2 tab during the day)., Disp: 40 tablet, Rfl: 1 ibuprofen  (ADVIL,MOTRIN) 600 MG tablet, Take 1 tablet (600 mg total) by mouth every 6 (six) hours as needed., Disp: 30 tablet, Rfl: 2 levothyroxine (SYNTHROID, LEVOTHROID) 50 MCG tablet, Take 1 tablet (50 mcg total) by mouth every morning., Disp: 90 tablet, Rfl: 1 metoprolol succinate (TOPROL XL) 50 MG 24 hr tablet, Take 1 tablet (50 mg total) by mouth daily., Disp: 30 tablet, Rfl: 12 ranitidine (ZANTAC) 150 MG tablet, Take 150 mg by mouth at bedtime.  , Disp: , Rfl:  traMADol (ULTRAM) 50 MG tablet, Take 1 tablet (50 mg total) by mouth every 8 (eight) hours as needed for pain., Disp: 40 tablet, Rfl: 1  No current facility-administered medications on file prior to visit.   Past Medical History:   GERD (gastroesophageal reflux disease)                       Hypertension                                                   Comment:only with preg. 66yrs ago 2002- no meds   Headache(784.0)  Comment:otc meds prn, migraine   Chicken pox                                     as a child   Preventative health care                        06/13/2012    BCC (basal cell carcinoma), face                               Comment:skin    Hypothyroid                                     06/15/2012    Depression with anxiety                         10/06/2012     Hyperlipidemia                                              Past Surgical History:   TONSILLECTOMY                                                 CESAREAN SECTION                                 2002         LAPAROSCOPIC TUBAL LIGATION                      01/03/2011       Comment:Procedure: LAPAROSCOPIC TUBAL LIGATION;                Surgeon: Marylynn Pearson;  Location: [facility];                Service: Gynecology;  Laterality: Bilateral;                with filshie clips   ABDOMINAL HYSTERECTOMY                           03/04/2012      Comment:Procedure: HYSTERECTOMY ABDOMINAL;  Surgeon:               Marylynn Pearson, MD;   Location: [facility];                Service: Gynecology;  Laterality: N/A;  with               lysis of adhesions   LAPAROSCOPY                                      03/04/2012      Comment:Procedure: LAPAROSCOPY DIAGNOSTIC;  Surgeon:               Marylynn Pearson, MD;  Location: [facility];                Service: Gynecology;  Laterality: N/A;  with               lysis of adhesions  Review of patient's family history indicates:   Cancer                         Mother                     Comment: lung (06-13-11) breast -10 years ago   Hypertension                   Mother                   Depression                     Mother                   Cancer                         Brother                    Comment: skin   Other                          Maternal Grandmother       Comment: spinal stenosis   Cancer                         Maternal Grandfather       Comment: prostate   Emphysema                      Maternal Grandfather     Social History   Marital Status: Married             Spouse Name:                      Years of Education:                 Number of children: 1           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 Neck: No JVD, HJR, Bruit, or thyroid enlargement  Lungs: No tachypnea, clear without wheezing, rales, or rhonchi  Cardiovascular: RRR, PMI not displaced, heart sounds normal, no  murmurs, gallops, bruit, thrill, or heave.  Abdomen: BS normal. Soft without organomegaly, masses, lesions or tenderness.  Extremities: without cyanosis, clubbing or edema. Good distal pulses bilateral  SKin: Warm, no lesions or rashes   Musculoskeletal: No deformities  Neuro: no focal signs  BP 110/90  Pulse 71  Ht 5\' 5"  (1.651 m)  Wt 232 lb (105.235 kg)  BMI 38.61 kg/m2  LMP 12/15/2010    EKG: Normal sinus rhythm, normal EKG

## 2013-12-28 ENCOUNTER — Telehealth: Payer: Self-pay | Admitting: Family Medicine

## 2013-12-28 MED ORDER — LEVOTHYROXINE SODIUM 50 MCG PO TABS: 50.0000 ug | ORAL_TABLET | ORAL | Status: DC

## 2013-12-28 NOTE — Telephone Encounter (Signed)
Requesting refill on levothyroxine 60mcg 90day supply, please call into [facility] ph [phone] Fax [phone]

## 2014-01-07 ENCOUNTER — Other Ambulatory Visit: Payer: Self-pay | Admitting: Family Medicine

## 2014-03-30 ENCOUNTER — Telehealth: Payer: Self-pay

## 2014-03-30 MED ORDER — LEVOTHYROXINE SODIUM 50 MCG PO TABS: 50.0000 ug | ORAL_TABLET | ORAL | Status: DC

## 2014-03-30 NOTE — Telephone Encounter (Signed)
Please inform pt that she needs a follow up appt within 3 months to continue getting medication refills

## 2014-04-06 NOTE — Telephone Encounter (Signed)
Informed patient of medication refill and she scheduled appointment for January/2016

## 2014-04-22 ENCOUNTER — Other Ambulatory Visit: Payer: Self-pay | Admitting: Obstetrics and Gynecology

## 2014-04-26 LAB — CYTOLOGY - PAP

## 2014-05-12 NOTE — Progress Notes (Signed)
HPI: Fu SVT. She also apparently has been told she had a bicuspid aortic valve in the past. Echo in August of 2014 showed normal LV function, grade 2 diastolic dysfunction; mild LAE. Monitor in Oct of 2014 showed intermittent SVT. Recently seen for recurrent SVT and toprol added daily. Since then, she has had occasional brief skips but no sustained palpitations. However she feels fatigued since initiating Toprol.  Current Outpatient Prescriptions  Medication Sig Dispense Refill  . ALPRAZolam (XANAX) 0.25 MG tablet 1/2 to 2 tabs po bid prn anxiety and/or insomnia 40 tablet 1  . citalopram (CELEXA) 40 MG tablet TAKE 1 TABLET (40 MG TOTAL) BY MOUTH DAILY. 30 tablet 3  . ibuprofen (ADVIL,MOTRIN) 600 MG tablet Take 1 tablet (600 mg total) by mouth every 6 (six) hours as needed. 30 tablet 2  . levothyroxine (SYNTHROID, LEVOTHROID) 50 MCG tablet Take 1 tablet (50 mcg total) by mouth every morning. 90 tablet 0  . metoprolol succinate (TOPROL XL) 50 MG 24 hr tablet Take 1 tablet (50 mg total) by mouth daily. 30 tablet 12  . ranitidine (ZANTAC) 150 MG tablet Take 150 mg by mouth at bedtime.      . traMADol (ULTRAM) 50 MG tablet Take 1 tablet (50 mg total) by mouth every 8 (eight) hours as needed for pain. 40 tablet 1   No current facility-administered medications for this visit.     Past Medical History  Diagnosis Date  . GERD (gastroesophageal reflux disease)   . Hypertension     only with preg. 63yrs ago 2002- no meds  . Headache(784.0)     otc meds prn, migraine  . Chicken pox as a child  . Preventative health care 06/13/2012  . BCC (basal cell carcinoma), face     skin   . Hypothyroid 06/15/2012  . Depression with anxiety 10/06/2012  . Hyperlipidemia   . SVT (supraventricular tachycardia)     Past Surgical History  Procedure Laterality Date  . Tonsillectomy    . Cesarean section  2002  . Laparoscopic tubal ligation  01/03/2011    Procedure: LAPAROSCOPIC TUBAL LIGATION;  Surgeon:  Marylynn Pearson;  Location: [facility];  Service: Gynecology;  Laterality: Bilateral;  with filshie clips  . Abdominal hysterectomy  03/04/2012    Procedure: HYSTERECTOMY ABDOMINAL;  Surgeon: Marylynn Pearson, MD;  Location: [facility];  Service: Gynecology;  Laterality: N/A;  with lysis of adhesions  . Laparoscopy  03/04/2012    Procedure: LAPAROSCOPY DIAGNOSTIC;  Surgeon: Marylynn Pearson, MD;  Location: [facility];  Service: Gynecology;  Laterality: N/A;  with lysis of adhesions    History   Social History  . Marital Status: Married    Spouse Name: N/A    Number of Children: 1  . Years of Education: N/A   Occupational History  .      House wife   Social History Main Topics  . Smoking status: Never Smoker   . Smokeless tobacco: Never Used  . Alcohol Use: Yes     Comment: occasionally   . Drug Use: No  . Sexual Activity: Yes    Birth Control/ Protection: Surgical   Other Topics Concern  . Not on file   Social History Narrative    ROS: no fevers or chills, productive cough, hemoptysis, dysphasia, odynophagia, melena, hematochezia, dysuria, hematuria, rash, seizure activity, orthopnea, PND, pedal edema, claudication. Remaining systems are negative.  Physical Exam: Well-developed well-nourished in no acute distress.  Skin is warm and dry.  HEENT is normal.  Neck is supple.  Chest is clear to auscultation with normal expansion.  Cardiovascular exam is regular rate and rhythm.  Abdominal exam nontender or distended. No masses palpated. Extremities show no edema. neuro grossly intact  ECG sinus rhythm at a rate of 69. Nonspecific ST changes.

## 2014-05-14 ENCOUNTER — Encounter: Payer: Self-pay | Admitting: Cardiology

## 2014-05-14 ENCOUNTER — Ambulatory Visit (INDEPENDENT_AMBULATORY_CARE_PROVIDER_SITE_OTHER): Payer: Managed Care, Other (non HMO) | Admitting: Cardiology

## 2014-05-14 VITALS — BP 126/70 | HR 69 | Ht 65.0 in | Wt 240.0 lb

## 2014-05-14 DIAGNOSIS — R002 Palpitations: Secondary | ICD-10-CM

## 2014-05-14 DIAGNOSIS — I471 Supraventricular tachycardia: Secondary | ICD-10-CM

## 2014-05-14 MED ORDER — DILTIAZEM HCL ER COATED BEADS 120 MG PO CP24: 120.0000 mg | ORAL_CAPSULE | Freq: Every day | ORAL | Status: DC

## 2014-05-14 NOTE — Assessment & Plan Note (Signed)
Patient's SVT is much improved on Toprol. However she is complaining of fatigue which certainly could be related to her beta blocker. I will discontinue Toprol and instead treat with Cardizem CD 120 mg daily. Hopefully this will control her symptoms. If they become more frequent we will consider referral for ablation.

## 2014-05-14 NOTE — Patient Instructions (Signed)
Your physician recommends that you schedule a follow-up appointment in: 3 MONTHS WITH DR CRENSHAW  STOP METOPROLOL  START DILTIAZEM 120 MG ONCE DAILY

## 2014-05-17 ENCOUNTER — Other Ambulatory Visit: Payer: Self-pay | Admitting: Family Medicine

## 2014-06-10 ENCOUNTER — Encounter: Payer: Self-pay | Admitting: Family Medicine

## 2014-06-10 ENCOUNTER — Ambulatory Visit (INDEPENDENT_AMBULATORY_CARE_PROVIDER_SITE_OTHER): Payer: Managed Care, Other (non HMO) | Admitting: Family Medicine

## 2014-06-10 VITALS — BP 127/82 | HR 66 | Temp 98.0°F | Ht 65.0 in | Wt 240.0 lb

## 2014-06-10 DIAGNOSIS — F418 Other specified anxiety disorders: Secondary | ICD-10-CM

## 2014-06-10 DIAGNOSIS — E782 Mixed hyperlipidemia: Secondary | ICD-10-CM

## 2014-06-10 DIAGNOSIS — R519 Headache, unspecified: Secondary | ICD-10-CM

## 2014-06-10 DIAGNOSIS — E039 Hypothyroidism, unspecified: Secondary | ICD-10-CM

## 2014-06-10 DIAGNOSIS — R002 Palpitations: Secondary | ICD-10-CM

## 2014-06-10 DIAGNOSIS — R51 Headache: Secondary | ICD-10-CM

## 2014-06-10 LAB — COMPREHENSIVE METABOLIC PANEL
ALBUMIN: 4.1 g/dL (ref 3.5–5.2)
ALK PHOS: 69 U/L (ref 39–117)
ALT: 25 U/L (ref 0–35)
AST: 27 U/L (ref 0–37)
BILIRUBIN TOTAL: 0.4 mg/dL (ref 0.2–1.2)
BUN: 12 mg/dL (ref 6–23)
CALCIUM: 9.3 mg/dL (ref 8.4–10.5)
CO2: 24 mEq/L (ref 19–32)
Chloride: 103 mEq/L (ref 96–112)
Creatinine, Ser: 0.7 mg/dL (ref 0.4–1.2)
GFR: 97.19 mL/min (ref >60.00)
Glucose, Bld: 119 mg/dL — ABNORMAL HIGH (ref 70–99)
Potassium: 3.9 mEq/L (ref 3.5–5.1)
Sodium: 138 mEq/L (ref 135–145)
Total Protein: 7.7 g/dL (ref 6.0–8.3)

## 2014-06-10 LAB — CBC
HCT: 41.5 % (ref 36.0–46.0)
Hemoglobin: 14 g/dL (ref 12.0–15.0)
MCHC: 33.9 g/dL (ref 30.0–36.0)
MCV: 83.7 fl (ref 78.0–100.0)
PLATELETS: 319 10*3/uL (ref 150.0–400.0)
RBC: 4.95 Mil/uL (ref 3.87–5.11)
RDW: 13.1 % (ref 11.5–15.5)
WBC: 8.4 10*3/uL (ref 4.0–10.5)

## 2014-06-10 LAB — LIPID PANEL
Cholesterol: 205 mg/dL — ABNORMAL HIGH (ref 0–200)
HDL: 47.2 mg/dL (ref >39.00)
LDL CALC: 126 mg/dL — AB (ref 0–99)
NonHDL: 157.8
TRIGLYCERIDES: 157 mg/dL — AB (ref 0.0–149.0)
Total CHOL/HDL Ratio: 4
VLDL: 31.4 mg/dL (ref 0.0–40.0)

## 2014-06-10 LAB — TSH: TSH: 3.17 u[IU]/mL (ref 0.35–4.50)

## 2014-06-10 LAB — T3, FREE: T3, Free: 3.1 pg/mL (ref 2.3–4.2)

## 2014-06-10 LAB — T4, FREE: FREE T4: 0.74 ng/dL (ref 0.60–1.60)

## 2014-06-10 MED ORDER — ALPRAZOLAM 0.25 MG PO TABS: ORAL_TABLET | ORAL | Status: DC

## 2014-06-10 NOTE — Progress Notes (Signed)
Pre visit review using our clinic review tool, if applicable. No additional management support is needed unless otherwise documented below in the visit note. 

## 2014-06-10 NOTE — Patient Instructions (Signed)
Hypothyroidism The thyroid is a large gland located in the lower front of your neck. The thyroid gland helps control metabolism. Metabolism is how your body handles food. It controls metabolism with the hormone thyroxine. When this gland is underactive (hypothyroid), it produces too little hormone.  CAUSES These include:   Absence or destruction of thyroid tissue.  Goiter due to iodine deficiency.  Goiter due to medications.  Congenital defects (since birth).  Problems with the pituitary. This causes a lack of TSH (thyroid stimulating hormone). This hormone tells the thyroid to turn out more hormone. SYMPTOMS  Lethargy (feeling as though you have no energy)  Cold intolerance  Weight gain (in spite of normal food intake)  Dry skin  Coarse hair  Menstrual irregularity (if severe, may lead to infertility)  Slowing of thought processes Cardiac problems are also caused by insufficient amounts of thyroid hormone. Hypothyroidism in the newborn is cretinism, and is an extreme form. It is important that this form be treated adequately and immediately or it will lead rapidly to retarded physical and mental development. DIAGNOSIS  To prove hypothyroidism, your caregiver may do blood tests and ultrasound tests. Sometimes the signs are hidden. It may be necessary for your caregiver to watch this illness with blood tests either before or after diagnosis and treatment. TREATMENT  Low levels of thyroid hormone are increased by using synthetic thyroid hormone. This is a safe, effective treatment. It usually takes about four weeks to gain the full effects of the medication. After you have the full effect of the medication, it will generally take another four weeks for problems to leave. Your caregiver may start you on low doses. If you have had heart problems the dose may be gradually increased. It is generally not an emergency to get rapidly to normal. HOME CARE INSTRUCTIONS   Take your  medications as your caregiver suggests. Let your caregiver know of any medications you are taking or start taking. Your caregiver will help you with dosage schedules.  As your condition improves, your dosage needs may increase. It will be necessary to have continuing blood tests as suggested by your caregiver.  Report all suspected medication side effects to your caregiver. SEEK MEDICAL CARE IF: Seek medical care if you develop:  Sweating.  Tremulousness (tremors).  Anxiety.  Rapid weight loss.  Heat intolerance.  Emotional swings.  Diarrhea.  Weakness. SEEK IMMEDIATE MEDICAL CARE IF:  You develop chest pain, an irregular heart beat (palpitations), or a rapid heart beat. MAKE SURE YOU:   Understand these instructions.  Will watch your condition.  Will get help right away if you are not doing well or get worse. Document Released: 05/21/2005 Document Revised: 08/13/2011 Document Reviewed: 01/09/2008 Exit[employer] Patient Information 2015 [employer]. This information is not intended to replace advice given to you by your health care provider. Make sure you discuss any questions you have with your health care provider.  

## 2014-06-13 NOTE — Assessment & Plan Note (Signed)
On Levothyroxine, continue to monitor. Labs WNL

## 2014-06-13 NOTE — Assessment & Plan Note (Signed)
Headaches are much improved.

## 2014-06-13 NOTE — Progress Notes (Signed)
Ebony [name]  [id number] 24-Sep-1965 06/13/2014      Progress Note-Follow Up  Subjective  Chief Complaint  Chief Complaint  Patient presents with  . Medication check    and refills    HPI  Patient is a 49 y.o. female in today for routine medical care. Patient is in today for follow-up. She continues to struggle with grief after losing her mother. She struggles watching her father stroke. The holidays were difficult. She has had decreased migraines and palpitations however. Metoprolol caused excessive fatigue since she has been switched to diltiazem. She does still continue to have some fatigue. Has recently been seen by [facility]. No concerns. Denies CP/palp/SOB/HA/congestion/fevers/GI or GU c/o. Taking meds as prescribed  Past Medical History  Diagnosis Date  . GERD (gastroesophageal reflux disease)   . Hypertension     only with preg. 58yrs ago 2002- no meds  . Headache(784.0)     otc meds prn, migraine  . Chicken pox as a child  . Preventative health care 06/13/2012  . BCC (basal cell carcinoma), face     skin   . Hypothyroid 06/15/2012  . Depression with anxiety 10/06/2012  . Hyperlipidemia   . SVT (supraventricular tachycardia)     Past Surgical History  Procedure Laterality Date  . Tonsillectomy    . Cesarean section  2002  . Laparoscopic tubal ligation  01/03/2011    Procedure: LAPAROSCOPIC TUBAL LIGATION;  Surgeon: Marylynn Pearson;  Location: [facility];  Service: Gynecology;  Laterality: Bilateral;  with filshie clips  . Abdominal hysterectomy  03/04/2012    Procedure: HYSTERECTOMY ABDOMINAL;  Surgeon: Marylynn Pearson, MD;  Location: [facility];  Service: Gynecology;  Laterality: N/A;  with lysis of adhesions  . Laparoscopy  03/04/2012    Procedure: LAPAROSCOPY DIAGNOSTIC;  Surgeon: Marylynn Pearson, MD;  Location: [facility];  Service: Gynecology;  Laterality: N/A;  with lysis of adhesions    Family History  Problem Relation Age of Onset  . Cancer Mother    lung (06-13-11) breast -10 years ago  . Hypertension Mother   . Depression Mother   . Cancer Brother     skin  . Other Maternal Grandmother     spinal stenosis  . Cancer Maternal Grandfather     prostate  . Emphysema Maternal Grandfather     History   Social History  . Marital Status: Married    Spouse Name: N/A    Number of Children: 1  . Years of Education: N/A   Occupational History  .      House wife   Social History Main Topics  . Smoking status: Never Smoker   . Smokeless tobacco: Never Used  . Alcohol Use: Yes     Comment: occasionally   . Drug Use: No  . Sexual Activity: Yes    Birth Control/ Protection: Surgical   Other Topics Concern  . Not on file   Social History Narrative    Current Outpatient Prescriptions on File Prior to Visit  Medication Sig Dispense Refill  . citalopram (CELEXA) 40 MG tablet TAKE 1 TABLET (40 MG TOTAL) BY MOUTH DAILY. 30 tablet 2  . diltiazem (CARDIZEM CD) 120 MG 24 hr capsule Take 1 capsule (120 mg total) by mouth daily. 90 capsule 3  . ibuprofen (ADVIL,MOTRIN) 600 MG tablet Take 1 tablet (600 mg total) by mouth every 6 (six) hours as needed. 30 tablet 2  . levothyroxine (SYNTHROID, LEVOTHROID) 50 MCG tablet Take 1 tablet (50 mcg  total) by mouth every morning. 90 tablet 0  . ranitidine (ZANTAC) 150 MG tablet Take 150 mg by mouth at bedtime.      . traMADol (ULTRAM) 50 MG tablet Take 1 tablet (50 mg total) by mouth every 8 (eight) hours as needed for pain. 40 tablet 1   No current facility-administered medications on file prior to visit.    No Known Allergies  Review of Systems  Review of Systems  Constitutional: Negative for fever and malaise/fatigue.  HENT: Negative for congestion.   Eyes: Negative for discharge.  Respiratory: Negative for shortness of breath.   Cardiovascular: Negative for chest pain, palpitations and leg swelling.  Gastrointestinal: Negative for nausea, abdominal pain and diarrhea.  Genitourinary:  Negative for dysuria.  Musculoskeletal: Positive for back pain. Negative for falls.  Skin: Negative for rash.  Neurological: Negative for loss of consciousness and headaches.  Endo/Heme/Allergies: Negative for polydipsia.  Psychiatric/Behavioral: Positive for depression. Negative for suicidal ideas. The patient is nervous/anxious. The patient does not have insomnia.     Objective  BP 127/82 mmHg  Pulse 66  Temp(Src) 98 F (36.7 C) (Oral)  Ht 5\' 5"  (1.651 m)  Wt 240 lb (108.863 kg)  BMI 39.94 kg/m2  SpO2 98%  LMP 12/15/2010  Physical Exam  Physical Exam  Constitutional: She is oriented to person, place, and time and well-developed, well-nourished, and in no distress. No distress.  HENT:  Head: Normocephalic and atraumatic.  Eyes: Conjunctivae are normal.  Neck: Neck supple. No thyromegaly present.  Cardiovascular: Normal rate, regular rhythm and normal heart sounds.   No murmur heard. Pulmonary/Chest: Effort normal and breath sounds normal. She has no wheezes.  Abdominal: She exhibits no distension and no mass.  Musculoskeletal: She exhibits no edema.  Lymphadenopathy:    She has no cervical adenopathy.  Neurological: She is alert and oriented to person, place, and time.  Skin: Skin is warm and dry. No rash noted. She is not diaphoretic.  Psychiatric: Memory, affect and judgment normal.    Lab Results  Component Value Date   TSH 3.17 06/10/2014   Lab Results  Component Value Date   WBC 8.4 06/10/2014   HGB 14.0 06/10/2014   HCT 41.5 06/10/2014   MCV 83.7 06/10/2014   PLT 319.0 06/10/2014   Lab Results  Component Value Date   CREATININE 0.7 06/10/2014   BUN 12 06/10/2014   NA 138 06/10/2014   K 3.9 06/10/2014   CL 103 06/10/2014   CO2 24 06/10/2014   Lab Results  Component Value Date   ALT 25 06/10/2014   AST 27 06/10/2014   ALKPHOS 69 06/10/2014   BILITOT 0.4 06/10/2014   Lab Results  Component Value Date   CHOL 205* 06/10/2014   Lab Results    Component Value Date   HDL 47.20 06/10/2014   Lab Results  Component Value Date   LDLCALC 126* 06/10/2014   Lab Results  Component Value Date   TRIG 157.0* 06/10/2014   Lab Results  Component Value Date   CHOLHDL 4 06/10/2014     Assessment & Plan  Hypothyroid On Levothyroxine, continue to monitor. Labs WNL  Headache Headaches are much improved.    Depression with anxiety Still struggling with the grief from loosing her mother and watching her father struggle. She will consider counseling and has been given contact information. Will continue Citalopram for now, uses Alprazolam very infrequently, given refill. Has persistent fatigue secondary to stress labs are unremarkable  Palpitations No recent  episodes

## 2014-06-13 NOTE — Assessment & Plan Note (Addendum)
Still struggling with the grief from loosing her mother and watching her father struggle. She will consider counseling and has been given contact information. Will continue Citalopram for now, uses Alprazolam very infrequently, given refill. Has persistent fatigue secondary to stress labs are unremarkable

## 2014-06-13 NOTE — Assessment & Plan Note (Signed)
No recent episodes

## 2014-06-27 ENCOUNTER — Other Ambulatory Visit: Payer: Self-pay | Admitting: Family Medicine

## 2014-07-25 ENCOUNTER — Other Ambulatory Visit: Payer: Self-pay | Admitting: Family Medicine

## 2014-08-18 NOTE — Progress Notes (Signed)
HPI: SVT. She also apparently has been told she had a bicuspid aortic valve in the past. Echo in August of 2014 showed normal LV function, grade 2 diastolic dysfunction; mild LAE. Monitor in Oct of 2014 showed intermittent SVT. Toprol added but developed fatigue; changed to cardizem. Since last seen, there is no dyspnea, chest pain or syncope. Her fatigue has improved off of beta blockade. She has had 2 episodes of SVT one lasting 1 hour. Symptoms are much improved.  Current Outpatient Prescriptions  Medication Sig Dispense Refill  . ALPRAZolam (XANAX) 0.25 MG tablet 1/2 to 2 tabs po bid prn anxiety and/or insomnia 40 tablet 1  . citalopram (CELEXA) 40 MG tablet TAKE 1 TABLET (40 MG TOTAL) BY MOUTH DAILY. 30 tablet 2  . diltiazem (CARDIZEM CD) 120 MG 24 hr capsule Take 1 capsule (120 mg total) by mouth daily. 90 capsule 3  . ibuprofen (ADVIL,MOTRIN) 600 MG tablet Take 1 tablet (600 mg total) by mouth every 6 (six) hours as needed. 30 tablet 2  . levothyroxine (SYNTHROID, LEVOTHROID) 50 MCG tablet TAKE 1 TABLET (50 MCG TOTAL) BY MOUTH EVERY MORNING. 90 tablet 1  . levothyroxine (SYNTHROID, LEVOTHROID) 50 MCG tablet TAKE 1 TABLET (50 MCG TOTAL) BY MOUTH EVERY MORNING. 90 tablet 0  . ranitidine (ZANTAC) 150 MG tablet Take 150 mg by mouth at bedtime.      . traMADol (ULTRAM) 50 MG tablet Take 1 tablet (50 mg total) by mouth every 8 (eight) hours as needed for pain. 40 tablet 1   No current facility-administered medications for this visit.     Past Medical History  Diagnosis Date  . GERD (gastroesophageal reflux disease)   . Hypertension     only with preg. 62yrs ago 2002- no meds  . Headache(784.0)     otc meds prn, migraine  . Chicken pox as a child  . Preventative health care 06/13/2012  . BCC (basal cell carcinoma), face     skin   . Hypothyroid 06/15/2012  . Depression with anxiety 10/06/2012  . Hyperlipidemia   . SVT (supraventricular tachycardia)     Past Surgical History    Procedure Laterality Date  . Tonsillectomy    . Cesarean section  2002  . Laparoscopic tubal ligation  01/03/2011    Procedure: LAPAROSCOPIC TUBAL LIGATION;  Surgeon: Marylynn Pearson;  Location: [facility];  Service: Gynecology;  Laterality: Bilateral;  with filshie clips  . Abdominal hysterectomy  03/04/2012    Procedure: HYSTERECTOMY ABDOMINAL;  Surgeon: Marylynn Pearson, MD;  Location: [facility];  Service: Gynecology;  Laterality: N/A;  with lysis of adhesions  . Laparoscopy  03/04/2012    Procedure: LAPAROSCOPY DIAGNOSTIC;  Surgeon: Marylynn Pearson, MD;  Location: [facility];  Service: Gynecology;  Laterality: N/A;  with lysis of adhesions    History   Social History  . Marital Status: Married    Spouse Name: N/A  . Number of Children: 1  . Years of Education: N/A   Occupational History  .      House wife   Social History Main Topics  . Smoking status: Never Smoker   . Smokeless tobacco: Never Used  . Alcohol Use: Yes     Comment: occasionally   . Drug Use: No  . Sexual Activity: Yes    Birth Control/ Protection: Surgical   Other Topics Concern  . Not on file   Social History Narrative    ROS: no fevers or chills, productive cough, hemoptysis,  dysphasia, odynophagia, melena, hematochezia, dysuria, hematuria, rash, seizure activity, orthopnea, PND, pedal edema, claudication. Remaining systems are negative.  Physical Exam: Well-developed well-nourished in no acute distress.  Skin is warm and dry.  HEENT is normal.  Neck is supple.  Chest is clear to auscultation with normal expansion.  Cardiovascular exam is regular rate and rhythm.  Abdominal exam nontender or distended. No masses palpated. Extremities show no edema. neuro grossly intact

## 2014-08-20 ENCOUNTER — Ambulatory Visit (INDEPENDENT_AMBULATORY_CARE_PROVIDER_SITE_OTHER): Payer: Managed Care, Other (non HMO) | Admitting: Cardiology

## 2014-08-20 ENCOUNTER — Encounter: Payer: Self-pay | Admitting: Cardiology

## 2014-08-20 VITALS — BP 120/72 | HR 60 | Ht 65.0 in | Wt 232.5 lb

## 2014-08-20 DIAGNOSIS — I471 Supraventricular tachycardia: Secondary | ICD-10-CM

## 2014-08-20 NOTE — Patient Instructions (Signed)
Your physician wants you to follow-up in: 6 MONTHS WITH DR CRENSHAW You will receive a reminder letter in the mail two months in advance. If you don't receive a letter, please call our office to schedule the follow-up appointment.  

## 2014-08-20 NOTE — Assessment & Plan Note (Signed)
Patient is tolerating Cardizem much better than beta-blockade. We'll continue present dose. Her symptoms are relatively infrequent. If she has more sustained episodes or more frequent episodes in the future we will refer to electrophysiology for consideration of ablation.

## 2014-09-08 ENCOUNTER — Other Ambulatory Visit: Payer: Self-pay | Admitting: Family Medicine

## 2014-10-01 ENCOUNTER — Ambulatory Visit (INDEPENDENT_AMBULATORY_CARE_PROVIDER_SITE_OTHER): Payer: Managed Care, Other (non HMO) | Admitting: Family Medicine

## 2014-10-01 ENCOUNTER — Encounter: Payer: Self-pay | Admitting: Family Medicine

## 2014-10-01 VITALS — BP 124/78 | HR 101 | Temp 98.2°F | Resp 16 | Wt 227.0 lb

## 2014-10-01 DIAGNOSIS — M7062 Trochanteric bursitis, left hip: Secondary | ICD-10-CM

## 2014-10-01 DIAGNOSIS — M79662 Pain in left lower leg: Secondary | ICD-10-CM | POA: Insufficient documentation

## 2014-10-01 MED ORDER — PREDNISONE 10 MG PO TABS
ORAL_TABLET | ORAL | Status: DC
Start: 2014-10-01 — End: 2014-12-03

## 2014-10-01 NOTE — Patient Instructions (Signed)
Follow up as needed Start the Prednisone as directed- take w/ food ICE! If no improvement after 10-14 days, please call me so we can refer to ortho Call with any questions or concerns Hang in there!!!

## 2014-10-01 NOTE — Progress Notes (Signed)
Pre visit review using our clinic review tool, if applicable. No additional management support is needed unless otherwise documented below in the visit note. 

## 2014-10-01 NOTE — Progress Notes (Signed)
   Subjective:    Patient ID: Ebony [name], female    DOB: 1965-12-21, 49 y.o.   MRN: [id number]  HPI L hip pain- sxs started 'more than 2 months' ago.  Pt thought pain would improve but it has not.  No worse or no better w/ exercise (walking).  Has tried ibuprofen, aleve, tramadol, flexeril w/o relief.  Very painful to lie on, worse at night.   Review of Systems For ROS see HPI     Objective:   Physical Exam  Constitutional: She is oriented to person, place, and time. She appears well-developed and well-nourished. No distress.  HENT:  Head: Normocephalic and atraumatic.  Cardiovascular: Intact distal pulses.   Musculoskeletal: She exhibits no edema.  Full ROM of L hip + TTP over L greater trochanteric bursa (-) TTP over SI joint No groin pain  Neurological: She is alert and oriented to person, place, and time. She has normal reflexes. No cranial nerve deficit. Coordination normal.  (-) SLR bilaterally  Skin: Skin is warm and dry.  Psychiatric: She has a normal mood and affect. Her behavior is normal. Thought content normal.  Vitals reviewed.         Assessment & Plan:

## 2014-10-02 ENCOUNTER — Emergency Department (HOSPITAL_BASED_OUTPATIENT_CLINIC_OR_DEPARTMENT_OTHER)
Admission: EM | Admit: 2014-10-02 | Discharge: 2014-10-02 | Disposition: A | Discharge status: Home / Self Care | Payer: Managed Care, Other (non HMO) | Attending: Emergency Medicine | Admitting: Emergency Medicine

## 2014-10-02 ENCOUNTER — Encounter (HOSPITAL_BASED_OUTPATIENT_CLINIC_OR_DEPARTMENT_OTHER): Payer: Self-pay | Admitting: Emergency Medicine

## 2014-10-02 DIAGNOSIS — I10 Essential (primary) hypertension: Secondary | ICD-10-CM | POA: Insufficient documentation

## 2014-10-02 DIAGNOSIS — Z79899 Other long term (current) drug therapy: Secondary | ICD-10-CM | POA: Diagnosis not present

## 2014-10-02 DIAGNOSIS — E039 Hypothyroidism, unspecified: Secondary | ICD-10-CM | POA: Diagnosis not present

## 2014-10-02 DIAGNOSIS — I471 Supraventricular tachycardia: Secondary | ICD-10-CM | POA: Diagnosis not present

## 2014-10-02 DIAGNOSIS — F418 Other specified anxiety disorders: Secondary | ICD-10-CM | POA: Diagnosis not present

## 2014-10-02 DIAGNOSIS — R51 Headache: Secondary | ICD-10-CM | POA: Diagnosis not present

## 2014-10-02 DIAGNOSIS — Z85828 Personal history of other malignant neoplasm of skin: Secondary | ICD-10-CM | POA: Diagnosis not present

## 2014-10-02 DIAGNOSIS — H53149 Visual discomfort, unspecified: Secondary | ICD-10-CM | POA: Diagnosis not present

## 2014-10-02 DIAGNOSIS — K219 Gastro-esophageal reflux disease without esophagitis: Secondary | ICD-10-CM | POA: Insufficient documentation

## 2014-10-02 DIAGNOSIS — R519 Headache, unspecified: Secondary | ICD-10-CM

## 2014-10-02 DIAGNOSIS — Z8619 Personal history of other infectious and parasitic diseases: Secondary | ICD-10-CM | POA: Diagnosis not present

## 2014-10-02 MED ORDER — METOCLOPRAMIDE HCL 5 MG/ML IJ SOLN
10.0000 mg | Freq: Once | INTRAMUSCULAR | Status: AC
  Administered 2014-10-02: 10 mg via INTRAMUSCULAR
  Filled 2014-10-02: qty 2

## 2014-10-02 MED ORDER — KETOROLAC TROMETHAMINE 60 MG/2ML IM SOLN
60.0000 mg | Freq: Once | INTRAMUSCULAR | Status: AC
  Administered 2014-10-02: 60 mg via INTRAMUSCULAR
  Filled 2014-10-02: qty 2

## 2014-10-02 MED ORDER — DIPHENHYDRAMINE HCL 50 MG/ML IJ SOLN
25.0000 mg | Freq: Once | INTRAMUSCULAR | Status: AC
  Administered 2014-10-02: 25 mg via INTRAMUSCULAR
  Filled 2014-10-02: qty 1

## 2014-10-02 NOTE — Discharge Instructions (Signed)
Please read and follow all provided instructions.  Your diagnoses today include:  1. Acute nonintractable headache, unspecified headache type     Tests performed today include:  Vital signs. See below for your results today.   Medications:  In the Emergency Department you received:  Reglan - antinausea/headache medication  Benadryl - antihistamine to counteract potential side effects of reglan  Toradol - NSAID medication similar to ibuprofen  Take any prescribed medications only as directed.  Additional information:  Follow any educational materials contained in this packet.  You are having a headache. No specific cause was found today for your headache. It may have been a migraine or other cause of headache. Stress, anxiety, fatigue, and depression are common triggers for headaches.   Your headache today does not appear to be life-threatening or require hospitalization, but often the exact cause of headaches is not determined in the emergency department. Therefore, follow-up with your doctor is very important to find out what may have caused your headache and whether or not you need any further diagnostic testing or treatment.   Sometimes headaches can appear benign (not harmful), but then more serious symptoms can develop which should prompt an immediate re-evaluation by your doctor or the emergency department.  BE VERY CAREFUL not to take multiple medicines containing Tylenol (also called acetaminophen). Doing so can lead to an overdose which can damage your liver and cause liver failure and possibly death.   Follow-up instructions: Please follow-up with your primary care provider in the next 3 days for further evaluation of your symptoms.   Return instructions:   Please return to the Emergency Department if you experience worsening symptoms.  Return if the medications do not resolve your headache, if it recurs, or if you have multiple episodes of vomiting or cannot keep  down fluids.  Return if you have a change from the usual headache.  RETURN IMMEDIATELY IF you:  Develop a sudden, severe headache  Develop confusion or become poorly responsive or faint  Develop a fever above 100.76F or problem breathing  Have a change in speech, vision, swallowing, or understanding  Develop new weakness, numbness, tingling, incoordination in your arms or legs  Have a seizure  Please return if you have any other emergent concerns.  Additional Information:  Your vital signs today were: BP 151/102 mmHg   Pulse 71   Temp(Src) 98.5 F (36.9 C) (Oral)   Resp 16   Ht 5\' 5"  (1.651 m)   Wt 227 lb (102.967 kg)   BMI 37.77 kg/m2   SpO2 100%   LMP 12/15/2010 If your blood pressure (BP) was elevated above 135/85 this visit, please have this repeated by your doctor within one month. --------------

## 2014-10-02 NOTE — ED Notes (Signed)
Patient states that she was started on prednisone yesterday due to bursitis. The patient reports that she woke up with a HA this am. Has taken 2 ultrams with no assistance in the pain

## 2014-10-02 NOTE — ED Provider Notes (Signed)
CSN: [id number]     Arrival date & time 10/02/14  1939 History   First MD Initiated Contact with Patient 10/02/14 2002     Chief Complaint  Patient presents with  . Migraine     (Consider location/radiation/quality/duration/timing/severity/associated sxs/prior Treatment) HPI Comments: Patients with history of migraine headaches, presents with her usual migraine headaches described as a throbbing pain in the back of her head that throbs with her heartbeat. It is associated with photophobia and phonophobia. No fever. No head injury. No sinus congestion or dental pain. Patient was recently started on prednisone for bursitis in her hip and she wonders if this could be a side effect of this. Her last migraine was 12-18 months ago. No nausea or vomiting. Patient denies signs of stroke including: facial droop, slurred speech, aphasia, weakness/numbness in extremities, imbalance/trouble walking.   The history is provided by the patient.    Past Medical History  Diagnosis Date  . GERD (gastroesophageal reflux disease)   . Hypertension     only with preg. 12yrs ago 2002- no meds  . Headache(784.0)     otc meds prn, migraine  . Chicken pox as a child  . Preventative health care 06/13/2012  . BCC (basal cell carcinoma), face     skin   . Hypothyroid 06/15/2012  . Depression with anxiety 10/06/2012  . Hyperlipidemia   . SVT (supraventricular tachycardia)    Past Surgical History  Procedure Laterality Date  . Tonsillectomy    . Cesarean section  2002  . Laparoscopic tubal ligation  01/03/2011    Procedure: LAPAROSCOPIC TUBAL LIGATION;  Surgeon: Marylynn Pearson;  Location: [facility];  Service: Gynecology;  Laterality: Bilateral;  with filshie clips  . Abdominal hysterectomy  03/04/2012    Procedure: HYSTERECTOMY ABDOMINAL;  Surgeon: Marylynn Pearson, MD;  Location: [facility];  Service: Gynecology;  Laterality: N/A;  with lysis of adhesions  . Laparoscopy  03/04/2012    Procedure: LAPAROSCOPY DIAGNOSTIC;   Surgeon: Marylynn Pearson, MD;  Location: [facility];  Service: Gynecology;  Laterality: N/A;  with lysis of adhesions   Family History  Problem Relation Age of Onset  . Cancer Mother     lung (06-13-11) breast -10 years ago  . Hypertension Mother   . Depression Mother   . Cancer Brother     skin  . Other Maternal Grandmother     spinal stenosis  . Cancer Maternal Grandfather     prostate  . Emphysema Maternal Grandfather    History  Substance Use Topics  . Smoking status: Never Smoker   . Smokeless tobacco: Never Used  . Alcohol Use: Yes     Comment: occasionally    OB History    No data available     Review of Systems  Constitutional: Negative for fever.  HENT: Negative for congestion, dental problem, rhinorrhea and sinus pressure.   Eyes: Positive for photophobia. Negative for discharge, redness and visual disturbance.  Respiratory: Negative for shortness of breath.   Cardiovascular: Negative for chest pain.  Gastrointestinal: Negative for nausea and vomiting.  Musculoskeletal: Negative for gait problem, neck pain and neck stiffness.  Skin: Negative for rash.  Neurological: Positive for headaches. Negative for syncope, speech difficulty, weakness, light-headedness and numbness.  Psychiatric/Behavioral: Negative for confusion.      Allergies  Review of patient's allergies indicates no known allergies.  Home Medications   Prior to Admission medications   Medication Sig Start Date End Date Taking? Authorizing Provider  ALPRAZolam Duanne Moron) 0.25 MG  tablet 1/2 to 2 tabs po bid prn anxiety and/or insomnia 06/10/14   Mosie Lukes, MD  citalopram (CELEXA) 40 MG tablet Take 1 tablet (40 mg total) by mouth daily. 09/08/14   Mosie Lukes, MD  diltiazem (CARDIZEM CD) 120 MG 24 hr capsule Take 1 capsule (120 mg total) by mouth daily. 05/14/14   Lelon Perla, MD  levothyroxine (SYNTHROID, LEVOTHROID) 50 MCG tablet TAKE 1 TABLET (50 MCG TOTAL) BY MOUTH EVERY MORNING. 06/28/14    Mosie Lukes, MD  predniSONE (DELTASONE) 10 MG tablet 3 tabs x3 days and then 2 tabs x3 days and then 1 tab x3 days.  Take w/ food. 10/01/14   Midge Minium, MD  ranitidine (ZANTAC) 150 MG tablet Take 150 mg by mouth at bedtime.      Historical Provider, MD  traMADol (ULTRAM) 50 MG tablet Take 1 tablet (50 mg total) by mouth every 8 (eight) hours as needed for pain. 01/08/13   Mosie Lukes, MD   BP 151/102 mmHg  Pulse 71  Temp(Src) 98.5 F (36.9 C) (Oral)  Resp 16  Ht 5\' 5"  (1.651 m)  Wt 227 lb (102.967 kg)  BMI 37.77 kg/m2  SpO2 100%  LMP 12/15/2010   Physical Exam  Constitutional: She is oriented to person, place, and time. She appears well-developed and well-nourished.  HENT:  Head: Normocephalic and atraumatic.  Right Ear: Tympanic membrane, external ear and ear canal normal.  Left Ear: Tympanic membrane, external ear and ear canal normal.  Nose: Nose normal.  Mouth/Throat: Uvula is midline, oropharynx is clear and moist and mucous membranes are normal.  Eyes: Conjunctivae, EOM and lids are normal. Pupils are equal, round, and reactive to light. Right eye exhibits no nystagmus. Left eye exhibits no nystagmus.  Neck: Normal range of motion. Neck supple.  Cardiovascular: Normal rate and regular rhythm.   Pulmonary/Chest: Effort normal and breath sounds normal.  Abdominal: Soft. There is no tenderness.  Musculoskeletal:       Cervical back: She exhibits normal range of motion, no tenderness and no bony tenderness.  Neurological: She is alert and oriented to person, place, and time. She has normal strength and normal reflexes. No cranial nerve deficit or sensory deficit. She displays a negative Romberg sign. Coordination and gait normal. GCS eye subscore is 4. GCS verbal subscore is 5. GCS motor subscore is 6.  Skin: Skin is warm and dry.  Psychiatric: She has a normal mood and affect.  Nursing note and vitals reviewed.   ED Course  Procedures (including critical care  time) Labs Review Labs Reviewed - No data to display  Imaging Review No results found.   EKG Interpretation None      8:26 PM Patient seen and examined. Medications ordered.   Vital signs reviewed and are as follows: BP 151/102 mmHg  Pulse 71  Temp(Src) 98.5 F (36.9 C) (Oral)  Resp 16  Ht 5\' 5"  (1.651 m)  Wt 227 lb (102.967 kg)  BMI 37.77 kg/m2  SpO2 100%  LMP 12/15/2010  Pain improved. Pt ready for d/c to home.   Patient urged to return with worsening symptoms or other concerns. Patient verbalized understanding and agrees with plan.    MDM   Final diagnoses:  Acute nonintractable headache, unspecified headache type   Patient without high-risk features of headache including: sudden onset/thunderclap HA, no similar headache in past, altered mental status, accompanying seizure, headache with exertion, age > 57, history of immunocompromise, neck or shoulder  pain, fever, use of anticoagulation, family history of spontaneous SAH, concomitant drug use, toxic exposure.   Patient has a normal complete neurological exam, normal vital signs, normal level of consciousness, no signs of meningismus, is well-appearing/non-toxic appearing, no signs of trauma.   Imaging with CT/MRI not indicated given history and physical exam findings.   No dangerous or life-threatening conditions suspected or identified by history, physical exam, and by work-up. No indications for hospitalization identified.      Carlisle Cater, PA-C 10/02/14 2214  Veryl Speak, MD 10/02/14 2256

## 2014-10-03 NOTE — Assessment & Plan Note (Signed)
New.  Pt's pain is moderate to severe.  No difficulty w/ walking or exercise but pain is preventing her from sleeping at night.  Start pred taper, ice.  If no improvement will need ortho referral.  Reviewed supportive care and red flags that should prompt return.  Pt expressed understanding and is in agreement w/ plan.

## 2014-10-20 ENCOUNTER — Encounter: Payer: Self-pay | Admitting: Family Medicine

## 2014-10-20 DIAGNOSIS — M7062 Trochanteric bursitis, left hip: Secondary | ICD-10-CM

## 2014-10-20 NOTE — Telephone Encounter (Signed)
Referral placed per Dr. Birdie Riddle

## 2014-10-25 ENCOUNTER — Other Ambulatory Visit: Payer: Self-pay | Admitting: Family Medicine

## 2014-12-03 ENCOUNTER — Emergency Department (HOSPITAL_BASED_OUTPATIENT_CLINIC_OR_DEPARTMENT_OTHER): Payer: 59

## 2014-12-03 ENCOUNTER — Encounter (HOSPITAL_BASED_OUTPATIENT_CLINIC_OR_DEPARTMENT_OTHER): Payer: Self-pay | Admitting: *Deleted

## 2014-12-03 ENCOUNTER — Emergency Department (HOSPITAL_BASED_OUTPATIENT_CLINIC_OR_DEPARTMENT_OTHER)
Admission: EM | Admit: 2014-12-03 | Discharge: 2014-12-03 | Disposition: A | Discharge status: Home / Self Care | Payer: 59 | Attending: Emergency Medicine | Admitting: Emergency Medicine

## 2014-12-03 DIAGNOSIS — S0990XA Unspecified injury of head, initial encounter: Secondary | ICD-10-CM | POA: Diagnosis present

## 2014-12-03 DIAGNOSIS — Z8619 Personal history of other infectious and parasitic diseases: Secondary | ICD-10-CM | POA: Insufficient documentation

## 2014-12-03 DIAGNOSIS — E039 Hypothyroidism, unspecified: Secondary | ICD-10-CM | POA: Diagnosis not present

## 2014-12-03 DIAGNOSIS — K219 Gastro-esophageal reflux disease without esophagitis: Secondary | ICD-10-CM | POA: Diagnosis not present

## 2014-12-03 DIAGNOSIS — S060X0A Concussion without loss of consciousness, initial encounter: Secondary | ICD-10-CM | POA: Diagnosis not present

## 2014-12-03 DIAGNOSIS — Y9289 Other specified places as the place of occurrence of the external cause: Secondary | ICD-10-CM | POA: Insufficient documentation

## 2014-12-03 DIAGNOSIS — Z79899 Other long term (current) drug therapy: Secondary | ICD-10-CM | POA: Insufficient documentation

## 2014-12-03 DIAGNOSIS — W01198A Fall on same level from slipping, tripping and stumbling with subsequent striking against other object, initial encounter: Secondary | ICD-10-CM | POA: Insufficient documentation

## 2014-12-03 DIAGNOSIS — I471 Supraventricular tachycardia: Secondary | ICD-10-CM | POA: Diagnosis not present

## 2014-12-03 DIAGNOSIS — F418 Other specified anxiety disorders: Secondary | ICD-10-CM | POA: Diagnosis not present

## 2014-12-03 DIAGNOSIS — Z85828 Personal history of other malignant neoplasm of skin: Secondary | ICD-10-CM | POA: Diagnosis not present

## 2014-12-03 DIAGNOSIS — Y998 Other external cause status: Secondary | ICD-10-CM | POA: Diagnosis not present

## 2014-12-03 DIAGNOSIS — I10 Essential (primary) hypertension: Secondary | ICD-10-CM | POA: Diagnosis not present

## 2014-12-03 DIAGNOSIS — Y9389 Activity, other specified: Secondary | ICD-10-CM | POA: Insufficient documentation

## 2014-12-03 MED ORDER — ACETAMINOPHEN 500 MG PO TABS
1000.0000 mg | ORAL_TABLET | Freq: Once | ORAL | Status: AC
  Administered 2014-12-03: 1000 mg via ORAL
  Filled 2014-12-03: qty 2

## 2014-12-03 NOTE — ED Notes (Signed)
States she was bowling and her feet went out from under her and she hit hard on back of head.. No other injury. No LOC.

## 2014-12-03 NOTE — ED Provider Notes (Signed)
CSN: [id number]     Arrival date & time 12/03/14  1636 History   First MD Initiated Contact with Patient 12/03/14 1748     No chief complaint on file.    (Consider location/radiation/quality/duration/timing/severity/associated sxs/prior Treatment) HPI Comments: Patient presents to the emergency department with chief complaint of head injury. She states that she was bowling, when both of her feet slipped out from underneath her, and she fell backward striking her head on the ground. She denies any loss of consciousness. Denies weakness, or numbness. States that she does have a generalized tingling sensation. Husband reports that she has had some mild amnesia, having difficulty remembering her Social Security number and her birthdate. She has been answering oceans appropriately. She denies any vision loss. Denies any slurred speech. There are no aggravating or alleviating factors. Patient does not take blood thinner.  The history is provided by the patient. No language interpreter was used.    Past Medical History  Diagnosis Date  . GERD (gastroesophageal reflux disease)   . Hypertension     only with preg. 24yrs ago 2002- no meds  . Headache(784.0)     otc meds prn, migraine  . Chicken pox as a child  . Preventative health care 06/13/2012  . BCC (basal cell carcinoma), face     skin   . Hypothyroid 06/15/2012  . Depression with anxiety 10/06/2012  . Hyperlipidemia   . SVT (supraventricular tachycardia)    Past Surgical History  Procedure Laterality Date  . Tonsillectomy    . Cesarean section  2002  . Laparoscopic tubal ligation  01/03/2011    Procedure: LAPAROSCOPIC TUBAL LIGATION;  Surgeon: Marylynn Pearson;  Location: [facility];  Service: Gynecology;  Laterality: Bilateral;  with filshie clips  . Abdominal hysterectomy  03/04/2012    Procedure: HYSTERECTOMY ABDOMINAL;  Surgeon: Marylynn Pearson, MD;  Location: [facility];  Service: Gynecology;  Laterality: N/A;  with lysis of adhesions  .  Laparoscopy  03/04/2012    Procedure: LAPAROSCOPY DIAGNOSTIC;  Surgeon: Marylynn Pearson, MD;  Location: [facility];  Service: Gynecology;  Laterality: N/A;  with lysis of adhesions   Family History  Problem Relation Age of Onset  . Cancer Mother     lung (06-13-11) breast -10 years ago  . Hypertension Mother   . Depression Mother   . Cancer Brother     skin  . Other Maternal Grandmother     spinal stenosis  . Cancer Maternal Grandfather     prostate  . Emphysema Maternal Grandfather    History  Substance Use Topics  . Smoking status: Never Smoker   . Smokeless tobacco: Never Used  . Alcohol Use: Yes     Comment: occasionally    OB History    No data available     Review of Systems  Constitutional: Negative for fever and chills.  Respiratory: Negative for shortness of breath.   Cardiovascular: Negative for chest pain.  Gastrointestinal: Negative for nausea, vomiting, diarrhea and constipation.  Genitourinary: Negative for dysuria.  Neurological: Positive for headaches.  All other systems reviewed and are negative.     Allergies  Review of patient's allergies indicates no known allergies.  Home Medications   Prior to Admission medications   Medication Sig Start Date End Date Taking? Authorizing Provider  citalopram (CELEXA) 40 MG tablet Take 1 tablet (40 mg total) by mouth daily. 09/08/14  Yes Mosie Lukes, MD  diltiazem (CARDIZEM CD) 120 MG 24 hr capsule Take 1 capsule (120 mg  total) by mouth daily. 05/14/14  Yes Lelon Perla, MD  levothyroxine (SYNTHROID, LEVOTHROID) 50 MCG tablet TAKE 1 TABLET (50 MCG TOTAL) BY MOUTH EVERY MORNING. 06/28/14  Yes Mosie Lukes, MD  ranitidine (ZANTAC) 150 MG tablet Take 150 mg by mouth at bedtime.     Yes Historical Provider, MD  traMADol (ULTRAM) 50 MG tablet Take 1 tablet (50 mg total) by mouth every 8 (eight) hours as needed for pain. 01/08/13  Yes Mosie Lukes, MD   BP 125/81 mmHg  Pulse 92  Temp(Src) 98.1 F (36.7 C) (Oral)   Resp 16  Ht 5\' 5"  (1.651 m)  Wt 230 lb (104.327 kg)  BMI 38.27 kg/m2  SpO2 97%  LMP 12/15/2010 Physical Exam  Constitutional: She is oriented to person, place, and time. She appears well-developed and well-nourished.  HENT:  Head: Normocephalic and atraumatic.  Right Ear: External ear normal.  Left Ear: External ear normal.  Eyes: Conjunctivae and EOM are normal. Pupils are equal, round, and reactive to light.  Neck: Normal range of motion. Neck supple.  No pain with neck flexion, no meningismus  Cardiovascular: Normal rate, regular rhythm and normal heart sounds.  Exam reveals no gallop and no friction rub.   No murmur heard. Pulmonary/Chest: Effort normal and breath sounds normal. No respiratory distress. She has no wheezes. She has no rales. She exhibits no tenderness.  Abdominal: Soft. She exhibits no distension and no mass. There is no tenderness. There is no rebound and no guarding.  Musculoskeletal: Normal range of motion. She exhibits no edema or tenderness.  Normal gait.  Neurological: She is alert and oriented to person, place, and time. She has normal reflexes.  CN 3-12 intact, normal finger to nose, no pronator drift, sensation and strength intact bilaterally.  Skin: Skin is warm and dry.  Psychiatric: She has a normal mood and affect. Her behavior is normal. Judgment and thought content normal.  Nursing note and vitals reviewed.   ED Course  Procedures (including critical care time) Labs Review Labs Reviewed - No data to display  Imaging Review Ct Head Wo Contrast  12/03/2014   CLINICAL DATA:  Slipped, fell backwards at bowling alley today, hit back of head. Dizziness, nausea.  EXAM: CT HEAD WITHOUT CONTRAST  TECHNIQUE: Contiguous axial images were obtained from the base of the skull through the vertex without intravenous contrast.  COMPARISON:  None.  FINDINGS: No acute intracranial abnormality. Specifically, no hemorrhage, hydrocephalus, mass lesion, acute infarction,  or significant intracranial injury. No acute calvarial abnormality. Visualized paranasal sinuses and mastoids clear. Orbital soft tissues unremarkable.  IMPRESSION: Negative   Electronically Signed   By: Rolm Baptise M.D.   On: 12/03/2014 19:06     EKG Interpretation None      MDM   Final diagnoses:  Concussion, without loss of consciousness, initial encounter    Patient with head injury after falling on the bowling alley floor.  CT scan is negative. No battle sign or other serious scalp injury. Patient likely has a concussion. Precautions given regarding concussion. Patient and family members understand and agree with the plan. She is stable and ready for discharge.    Montine Circle, PA-C 12/03/14 [place], MD 12/03/14 2007

## 2014-12-03 NOTE — Discharge Instructions (Signed)
Concussion  A concussion, or closed-head injury, is a brain injury caused by a direct blow to the head or by a quick and sudden movement (jolt) of the head or neck. Concussions are usually not life-threatening. Even so, the effects of a concussion can be serious. If you have had a concussion before, you are more likely to experience concussion-like symptoms after a direct blow to the head.   CAUSES  · Direct blow to the head, such as from running into another player during a soccer game, being hit in a fight, or hitting your head on a hard surface.  · A jolt of the head or neck that causes the brain to move back and forth inside the skull, such as in a car crash.  SIGNS AND SYMPTOMS  The signs of a concussion can be hard to notice. Early on, they may be missed by you, family members, and health care providers. You may look fine but act or feel differently.  Symptoms are usually temporary, but they may last for days, weeks, or even longer. Some symptoms may appear right away while others may not show up for hours or days. Every head injury is different. Symptoms include:  · Mild to moderate headaches that will not go away.  · A feeling of pressure inside your head.  · Having more trouble than usual:  ¨ Learning or remembering things you have heard.  ¨ Answering questions.  ¨ Paying attention or concentrating.  ¨ Organizing daily tasks.  ¨ Making decisions and solving problems.  · Slowness in thinking, acting or reacting, speaking, or reading.  · Getting lost or being easily confused.  · Feeling tired all the time or lacking energy (fatigued).  · Feeling drowsy.  · Sleep disturbances.  ¨ Sleeping more than usual.  ¨ Sleeping less than usual.  ¨ Trouble falling asleep.  ¨ Trouble sleeping (insomnia).  · Loss of balance or feeling lightheaded or dizzy.  · Nausea or vomiting.  · Numbness or tingling.  · Increased sensitivity to:  ¨ Sounds.  ¨ Lights.  ¨ Distractions.  · Vision problems or eyes that tire  easily.  · Diminished sense of taste or smell.  · Ringing in the ears.  · Mood changes such as feeling sad or anxious.  · Becoming easily irritated or angry for little or no reason.  · Lack of motivation.  · Seeing or hearing things other people do not see or hear (hallucinations).  DIAGNOSIS  Your health care provider can usually diagnose a concussion based on a description of your injury and symptoms. He or she will ask whether you passed out (lost consciousness) and whether you are having trouble remembering events that happened right before and during your injury.  Your evaluation might include:  · A brain scan to look for signs of injury to the brain. Even if the test shows no injury, you may still have a concussion.  · Blood tests to be sure other problems are not present.  TREATMENT  · Concussions are usually treated in an emergency department, in urgent care, or at a clinic. You may need to stay in the hospital overnight for further treatment.  · Tell your health care provider if you are taking any medicines, including prescription medicines, over-the-counter medicines, and natural remedies. Some medicines, such as blood thinners (anticoagulants) and aspirin, may increase the chance of complications. Also tell your health care provider whether you have had alcohol or are taking illegal drugs. This information   may affect treatment.  · Your health care provider will send you home with important instructions to follow.  · How fast you will recover from a concussion depends on many factors. These factors include how severe your concussion is, what part of your brain was injured, your age, and how healthy you were before the concussion.  · Most people with mild injuries recover fully. Recovery can take time. In general, recovery is slower in older persons. Also, persons who have had a concussion in the past or have other medical problems may find that it takes longer to recover from their current injury.  HOME  CARE INSTRUCTIONS  General Instructions  · Carefully follow the directions your health care provider gave you.  · Only take over-the-counter or prescription medicines for pain, discomfort, or fever as directed by your health care provider.  · Take only those medicines that your health care provider has approved.  · Do not drink alcohol until your health care provider says you are well enough to do so. Alcohol and certain other drugs may slow your recovery and can put you at risk of further injury.  · If it is harder than usual to remember things, write them down.  · If you are easily distracted, try to do one thing at a time. For example, do not try to watch TV while fixing dinner.  · Talk with family members or close friends when making important decisions.  · Keep all follow-up appointments. Repeated evaluation of your symptoms is recommended for your recovery.  · Watch your symptoms and tell others to do the same. Complications sometimes occur after a concussion. Older adults with a brain injury may have a higher risk of serious complications, such as a blood clot on the brain.  · Tell your teachers, school nurse, school counselor, coach, athletic trainer, or work manager about your injury, symptoms, and restrictions. Tell them about what you can or cannot do. They should watch for:  ¨ Increased problems with attention or concentration.  ¨ Increased difficulty remembering or learning new information.  ¨ Increased time needed to complete tasks or assignments.  ¨ Increased irritability or decreased ability to cope with stress.  ¨ Increased symptoms.  · Rest. Rest helps the brain to heal. Make sure you:  ¨ Get plenty of sleep at night. Avoid staying up late at night.  ¨ Keep the same bedtime hours on weekends and weekdays.  ¨ Rest during the day. Take daytime naps or rest breaks when you feel tired.  · Limit activities that require a lot of thought or concentration. These include:  ¨ Doing homework or job-related  work.  ¨ Watching TV.  ¨ Working on the computer.  · Avoid any situation where there is potential for another head injury (football, hockey, soccer, basketball, martial arts, downhill snow sports and horseback riding). Your condition will get worse every time you experience a concussion. You should avoid these activities until you are evaluated by the appropriate follow-up health care providers.  Returning To Your Regular Activities  You will need to return to your normal activities slowly, not all at once. You must give your body and brain enough time for recovery.  · Do not return to sports or other athletic activities until your health care provider tells you it is safe to do so.  · Ask your health care provider when you can drive, ride a bicycle, or operate heavy machinery. Your ability to react may be slower after a   brain injury. Never do these activities if you are dizzy.  · Ask your health care provider about when you can return to work or school.  Preventing Another Concussion  It is very important to avoid another brain injury, especially before you have recovered. In rare cases, another injury can lead to permanent brain damage, brain swelling, or death. The risk of this is greatest during the first 7-10 days after a head injury. Avoid injuries by:  · Wearing a seat belt when riding in a car.  · Drinking alcohol only in moderation.  · Wearing a helmet when biking, skiing, skateboarding, skating, or doing similar activities.  · Avoiding activities that could lead to a second concussion, such as contact or recreational sports, until your health care provider says it is okay.  · Taking safety measures in your home.  ¨ Remove clutter and tripping hazards from floors and stairways.  ¨ Use grab bars in bathrooms and handrails by stairs.  ¨ Place non-slip mats on floors and in bathtubs.  ¨ Improve lighting in dim areas.  SEEK MEDICAL CARE IF:  · You have increased problems paying attention or  concentrating.  · You have increased difficulty remembering or learning new information.  · You need more time to complete tasks or assignments than before.  · You have increased irritability or decreased ability to cope with stress.  · You have more symptoms than before.  Seek medical care if you have any of the following symptoms for more than 2 weeks after your injury:  · Lasting (chronic) headaches.  · Dizziness or balance problems.  · Nausea.  · Vision problems.  · Increased sensitivity to noise or light.  · Depression or mood swings.  · Anxiety or irritability.  · Memory problems.  · Difficulty concentrating or paying attention.  · Sleep problems.  · Feeling tired all the time.  SEEK IMMEDIATE MEDICAL CARE IF:  · You have severe or worsening headaches. These may be a sign of a blood clot in the brain.  · You have weakness (even if only in one hand, leg, or part of the face).  · You have numbness.  · You have decreased coordination.  · You vomit repeatedly.  · You have increased sleepiness.  · One pupil is larger than the other.  · You have convulsions.  · You have slurred speech.  · You have increased confusion. This may be a sign of a blood clot in the brain.  · You have increased restlessness, agitation, or irritability.  · You are unable to recognize people or places.  · You have neck pain.  · It is difficult to wake you up.  · You have unusual behavior changes.  · You lose consciousness.  MAKE SURE YOU:  · Understand these instructions.  · Will watch your condition.  · Will get help right away if you are not doing well or get worse.  Document Released: 08/11/2003 Document Revised: 05/26/2013 Document Reviewed: 12/11/2012  Exit[employer]® Patient Information ©2015 [employer]. This information is not intended to replace advice given to you by your health care provider. Make sure you discuss any questions you have with your health care provider.

## 2014-12-28 ENCOUNTER — Encounter: Payer: Self-pay | Admitting: Family Medicine

## 2014-12-28 ENCOUNTER — Ambulatory Visit (INDEPENDENT_AMBULATORY_CARE_PROVIDER_SITE_OTHER): Payer: 59 | Admitting: Family Medicine

## 2014-12-28 VITALS — BP 108/82 | HR 94 | Temp 98.0°F | Ht 65.0 in | Wt 233.5 lb

## 2014-12-28 DIAGNOSIS — E039 Hypothyroidism, unspecified: Secondary | ICD-10-CM

## 2014-12-28 DIAGNOSIS — E782 Mixed hyperlipidemia: Secondary | ICD-10-CM | POA: Diagnosis not present

## 2014-12-28 DIAGNOSIS — B353 Tinea pedis: Secondary | ICD-10-CM

## 2014-12-28 DIAGNOSIS — R21 Rash and other nonspecific skin eruption: Secondary | ICD-10-CM | POA: Diagnosis not present

## 2014-12-28 DIAGNOSIS — R002 Palpitations: Secondary | ICD-10-CM

## 2014-12-28 DIAGNOSIS — M7062 Trochanteric bursitis, left hip: Secondary | ICD-10-CM

## 2014-12-28 DIAGNOSIS — S060X0D Concussion without loss of consciousness, subsequent encounter: Secondary | ICD-10-CM | POA: Diagnosis not present

## 2014-12-28 DIAGNOSIS — S060X0A Concussion without loss of consciousness, initial encounter: Secondary | ICD-10-CM

## 2014-12-28 HISTORY — DX: Mixed hyperlipidemia: E78.2

## 2014-12-28 HISTORY — DX: Tinea pedis: B35.3

## 2014-12-28 HISTORY — DX: Concussion without loss of consciousness, initial encounter: S06.0X0A

## 2014-12-28 NOTE — Assessment & Plan Note (Signed)
Encouraged moist heat and gentle stretching as tolerated. May try NSAIDs and prescription meds as directed and report if symptoms worsen or seek immediate care. Try Salon Pas patches

## 2014-12-28 NOTE — Assessment & Plan Note (Signed)
On Levothyroxine, continue to monitor 

## 2014-12-28 NOTE — Progress Notes (Signed)
Ebony [name]  [id number] Jul 17, 1965 12/28/2014      Progress Note-Follow Up  Subjective  Chief Complaint  Chief Complaint  Patient presents with  . Follow-up    feet problem    HPI  Patient is a 49 y.o. female in today for routine medical care. Patient is in today complaining of dry cracked feet. She has had trouble actually for several months but is recently worsened. She denies any pain or inspection. No redness or warmth. No fevers, chills malaise or myalgias. She reports generally her medications are working well for her other conditions. Denies CP/palp/SOB/HA/congestion/fevers/GI or GU c/o. Taking meds as prescribed  Past Medical History  Diagnosis Date  . GERD (gastroesophageal reflux disease)   . Hypertension     only with preg. 76yrs ago 2002- no meds  . Headache(784.0)     otc meds prn, migraine  . Chicken pox as a child  . Preventative health care 06/13/2012  . BCC (basal cell carcinoma), face     skin   . Hypothyroid 06/15/2012  . Depression with anxiety 10/06/2012  . Hyperlipidemia   . SVT (supraventricular tachycardia)     Past Surgical History  Procedure Laterality Date  . Tonsillectomy    . Cesarean section  2002  . Laparoscopic tubal ligation  01/03/2011    Procedure: LAPAROSCOPIC TUBAL LIGATION;  Surgeon: Marylynn Pearson;  Location: [facility];  Service: Gynecology;  Laterality: Bilateral;  with filshie clips  . Abdominal hysterectomy  03/04/2012    Procedure: HYSTERECTOMY ABDOMINAL;  Surgeon: Marylynn Pearson, MD;  Location: [facility];  Service: Gynecology;  Laterality: N/A;  with lysis of adhesions  . Laparoscopy  03/04/2012    Procedure: LAPAROSCOPY DIAGNOSTIC;  Surgeon: Marylynn Pearson, MD;  Location: [facility];  Service: Gynecology;  Laterality: N/A;  with lysis of adhesions    Family History  Problem Relation Age of Onset  . Cancer Mother     lung (06-13-11) breast -10 years ago  . Hypertension Mother   . Depression Mother   . Cancer Brother     skin    . Other Maternal Grandmother     spinal stenosis  . Cancer Maternal Grandfather     prostate  . Emphysema Maternal Grandfather     History   Social History  . Marital Status: Married    Spouse Name: N/A  . Number of Children: 1  . Years of Education: N/A   Occupational History  .      House wife   Social History Main Topics  . Smoking status: Never Smoker   . Smokeless tobacco: Never Used  . Alcohol Use: Yes     Comment: occasionally   . Drug Use: No  . Sexual Activity: Yes    Birth Control/ Protection: Surgical   Other Topics Concern  . Not on file   Social History Narrative    Current Outpatient Prescriptions on File Prior to Visit  Medication Sig Dispense Refill  . citalopram (CELEXA) 40 MG tablet Take 1 tablet (40 mg total) by mouth daily. 30 tablet 5  . diltiazem (CARDIZEM CD) 120 MG 24 hr capsule Take 1 capsule (120 mg total) by mouth daily. 90 capsule 3  . levothyroxine (SYNTHROID, LEVOTHROID) 50 MCG tablet TAKE 1 TABLET (50 MCG TOTAL) BY MOUTH EVERY MORNING. 90 tablet 1  . ranitidine (ZANTAC) 150 MG tablet Take 150 mg by mouth at bedtime.      . traMADol (ULTRAM) 50 MG tablet Take 1 tablet (50  mg total) by mouth every 8 (eight) hours as needed for pain. 40 tablet 1   No current facility-administered medications on file prior to visit.    No Known Allergies  Review of Systems  Review of Systems  Constitutional: Negative for fever and malaise/fatigue.  HENT: Negative for congestion.   Eyes: Negative for discharge.  Respiratory: Negative for shortness of breath.   Cardiovascular: Negative for chest pain, palpitations and leg swelling.  Gastrointestinal: Negative for nausea, abdominal pain and diarrhea.  Genitourinary: Negative for dysuria.  Musculoskeletal: Negative for falls.  Skin: Positive for rash.       Feet b/l  Neurological: Positive for headaches. Negative for loss of consciousness.  Endo/Heme/Allergies: Negative for polydipsia.   Psychiatric/Behavioral: Negative for depression and suicidal ideas. The patient is not nervous/anxious and does not have insomnia.     Objective  BP 108/82 mmHg  Pulse 94  Temp(Src) 98 F (36.7 C) (Oral)  Ht 5\' 5"  (1.651 m)  Wt 233 lb 8 oz (105.915 kg)  BMI 38.86 kg/m2  SpO2 97%  LMP 12/15/2010  Physical Exam  Physical Exam  Constitutional: She is oriented to person, place, and time and well-developed, well-nourished, and in no distress. No distress.  HENT:  Head: Normocephalic and atraumatic.  Eyes: Conjunctivae are normal.  Neck: Neck supple. No thyromegaly present.  Cardiovascular: Normal rate, regular rhythm and normal heart sounds.   No murmur heard. Pulmonary/Chest: Effort normal and breath sounds normal. She has no wheezes.  Abdominal: Soft. Bowel sounds are normal. She exhibits no distension and no mass.  Musculoskeletal: She exhibits no edema.  Lymphadenopathy:    She has no cervical adenopathy.  Neurological: She is alert and oriented to person, place, and time.  Skin: Skin is warm and dry. No rash noted. She is not diaphoretic.  Psychiatric: Memory, affect and judgment normal.    Lab Results  Component Value Date   TSH 3.17 06/10/2014   Lab Results  Component Value Date   WBC 8.4 06/10/2014   HGB 14.0 06/10/2014   HCT 41.5 06/10/2014   MCV 83.7 06/10/2014   PLT 319.0 06/10/2014   Lab Results  Component Value Date   CREATININE 0.7 06/10/2014   BUN 12 06/10/2014   NA 138 06/10/2014   K 3.9 06/10/2014   CL 103 06/10/2014   CO2 24 06/10/2014   Lab Results  Component Value Date   ALT 25 06/10/2014   AST 27 06/10/2014   ALKPHOS 69 06/10/2014   BILITOT 0.4 06/10/2014   Lab Results  Component Value Date   CHOL 205* 06/10/2014   Lab Results  Component Value Date   HDL 47.20 06/10/2014   Lab Results  Component Value Date   LDLCALC 126* 06/10/2014   Lab Results  Component Value Date   TRIG 157.0* 06/10/2014   Lab Results  Component  Value Date   CHOLHDL 4 06/10/2014     Assessment & Plan  Concussion with no loss of consciousness Improving memory, no headaches at this point  Tinea pedis Soak feet each night in 1/2 hot water and 1/2 distilled white vinegar, then debride with pumice stone and then apply Lamisil cream in pm in then in am apply Lavendar oil.    Trochanteric bursitis of left hip Encouraged moist heat and gentle stretching as tolerated. May try NSAIDs and prescription meds as directed and report if symptoms worsen or seek immediate care. Try Salon Pas patches  Palpitations No recent episodes, minimize caffeine  Hypothyroid On  Levothyroxine, continue to monitor  Hyperlipidemia, mixed Encouraged heart healthy diet, increase exercise, avoid trans fats, consider a krill oil cap daily

## 2014-12-28 NOTE — Assessment & Plan Note (Signed)
Soak feet each night in 1/2 hot water and 1/2 distilled white vinegar, then debride with pumice stone and then apply Lamisil cream in pm in then in am apply Lavendar oil.

## 2014-12-28 NOTE — Progress Notes (Signed)
Pre visit review using our clinic review tool, if applicable. No additional management support is needed unless otherwise documented below in the visit note. 

## 2014-12-28 NOTE — Patient Instructions (Signed)
Soak feet each night in 1/2 hot water and 1/2 distilled white vinegar, then debride with pumice stone and then apply Lamisil cream in pm in then in am apply Lavendar oil. [email or web address].  Try krill oil capsule daily and Curcumen caps daily  Vitamin D 2000 IU daily  Probiotics daily NOW has a 10 strain version in 1 cap  Salon Pas patches twice daily    Athlete's Foot Athlete's foot (tinea pedis) is a fungal infection of the skin on the feet. It often occurs on the skin between the toes or underneath the toes. It can also occur on the soles of the feet. Athlete's foot is more likely to occur in hot, humid weather. Not washing your feet or changing your socks often enough can contribute to athlete's foot. The infection can spread from person to person (contagious). CAUSES Athlete's foot is caused by a fungus. This fungus thrives in warm, moist places. Most people get athlete's foot by sharing shower stalls, towels, and wet floors with an infected person. People with weakened immune systems, including those with diabetes, may be more likely to get athlete's foot. SYMPTOMS   Itchy areas between the toes or on the soles of the feet.  White, flaky, or scaly areas between the toes or on the soles of the feet.  Tiny, intensely itchy blisters between the toes or on the soles of the feet.  Tiny cuts on the skin. These cuts can develop a bacterial infection.  Thick or discolored toenails. DIAGNOSIS  Your caregiver can usually tell what the problem is by doing a physical exam. Your caregiver may also take a skin sample from the rash area. The skin sample may be examined under a microscope, or it may be tested to see if fungus will grow in the sample. A sample may also be taken from your toenail for testing. TREATMENT  Over-the-counter and prescription medicines can be used to kill the fungus. These medicines are available as powders or creams. Your caregiver can suggest medicines for  you. Fungal infections respond slowly to treatment. You may need to continue using your medicine for several weeks. PREVENTION   Do not share towels.  Wear sandals in wet areas, such as shared locker rooms and shared showers.  Keep your feet dry. Wear shoes that allow air to circulate. Wear cotton or wool socks. HOME CARE INSTRUCTIONS   Take medicines as directed by your caregiver. Do not use steroid creams on athlete's foot.  Keep your feet clean and cool. Wash your feet daily and dry them thoroughly, especially between your toes.  Change your socks every day. Wear cotton or wool socks. In hot climates, you may need to change your socks 2 to 3 times per day.  Wear sandals or canvas tennis shoes with good air circulation.  If you have blisters, soak your feet in Burow's solution or Epsom salts for 20 to 30 minutes, 2 times a day to dry out the blisters. Make sure you dry your feet thoroughly afterward. SEEK MEDICAL CARE IF:   You have a fever.  You have swelling, soreness, warmth, or redness in your foot.  You are not getting better after 7 days of treatment.  You are not completely cured after 30 days.  You have any problems caused by your medicines. MAKE SURE YOU:   Understand these instructions.  Will watch your condition.  Will get help right away if you are not doing well or get worse. Document Released: 05/18/2000 Document  Revised: 08/13/2011 Document Reviewed: 03/09/2011 Exit[employer] Patient Information 2015 [employer]. This information is not intended to replace advice given to you by your health care provider. Make sure you discuss any questions you have with your health care provider.  

## 2014-12-28 NOTE — Assessment & Plan Note (Signed)
No recent episodes, minimize caffeine

## 2014-12-28 NOTE — Assessment & Plan Note (Signed)
Improving memory, no headaches at this point

## 2014-12-28 NOTE — Assessment & Plan Note (Signed)
Encouraged heart healthy diet, increase exercise, avoid trans fats, consider a krill oil cap daily 

## 2015-01-06 ENCOUNTER — Encounter: Payer: Self-pay | Admitting: Internal Medicine

## 2015-01-06 ENCOUNTER — Ambulatory Visit (INDEPENDENT_AMBULATORY_CARE_PROVIDER_SITE_OTHER): Payer: 59 | Admitting: Internal Medicine

## 2015-01-06 VITALS — BP 142/98 | HR 103 | Temp 98.0°F | Ht 65.0 in | Wt 232.0 lb

## 2015-01-06 DIAGNOSIS — R102 Pelvic and perineal pain: Secondary | ICD-10-CM

## 2015-01-06 DIAGNOSIS — J069 Acute upper respiratory infection, unspecified: Secondary | ICD-10-CM

## 2015-01-06 LAB — URINALYSIS, ROUTINE W REFLEX MICROSCOPIC
BILIRUBIN URINE: NEGATIVE
Ketones, ur: NEGATIVE
Leukocytes, UA: NEGATIVE
NITRITE: NEGATIVE
PH: 7.5 (ref 5.0–8.0)
Specific Gravity, Urine: 1.01 (ref 1.000–1.030)
Total Protein, Urine: NEGATIVE
Urine Glucose: NEGATIVE
Urobilinogen, UA: 0.2 (ref 0.0–1.0)

## 2015-01-06 LAB — POCT URINALYSIS DIPSTICK
Bilirubin, UA: NEGATIVE
GLUCOSE UA: NEGATIVE
Ketones, UA: NEGATIVE
LEUKOCYTES UA: NEGATIVE
NITRITE UA: NEGATIVE
PROTEIN UA: NEGATIVE
Spec Grav, UA: 1.01
UROBILINOGEN UA: 0.2
pH, UA: 7

## 2015-01-06 NOTE — Patient Instructions (Signed)
  For pain take Motrin or Tylenol as needed.  If the right side pain increases or you develop stomach pain, nausea, vomiting, fever, chills: Go to a urgent care while in [place] or call us.    If  cough: Take Mucinex DM twice a day as needed until better  For nasal congestion Use OTC Nasocort or Flonase : 2 nasal sprays on each side of the nose daily until you feel better    Avoid OTC decongestants

## 2015-01-06 NOTE — Progress Notes (Signed)
Subjective:    Patient ID: Ebony [name], female    DOB: [birth year], 49 y.o.   MRN: [id number]  DOS:  01/06/2015 Type of visit - description : Acute, chief complaint is right-sided pain Interval history: Started with pain last night at around 1 AM, located at the right side of the pelvis. The area is tender to touch, worse when she transitions from sitting to standing or when she walks. Denies any injury.  Also yesterday developed a sinus congestion and to a OTC decongestion last night.    Review of Systems  No fever chills, appetite is normal. No nausea, vomiting or actual abdominal pain. Bowel movements normal. No cough No dysuria, gross hematuria or difficulty urinating  Past Medical History  Diagnosis Date  . GERD (gastroesophageal reflux disease)   . Hypertension     only with preg. 52yrs ago 2002- no meds  . Headache(784.0)     otc meds prn, migraine  . Chicken pox as a child  . Preventative health care 06/13/2012  . BCC (basal cell carcinoma), face     skin   . Hypothyroid 06/15/2012  . Depression with anxiety 10/06/2012  . Hyperlipidemia   . SVT (supraventricular tachycardia)   . Concussion with no loss of consciousness 12/28/2014  . Tinea pedis 12/28/2014  . Hyperlipidemia, mixed 12/28/2014    Past Surgical History  Procedure Laterality Date  . Tonsillectomy    . Cesarean section  2002  . Laparoscopic tubal ligation  01/03/2011    Procedure: LAPAROSCOPIC TUBAL LIGATION;  Surgeon: Marylynn Pearson;  Location: [facility];  Service: Gynecology;  Laterality: Bilateral;  with filshie clips  . Abdominal hysterectomy  03/04/2012    Procedure: HYSTERECTOMY ABDOMINAL;  Surgeon: Marylynn Pearson, MD;  Location: [facility];  Service: Gynecology;  Laterality: N/A;  with lysis of adhesions  . Laparoscopy  03/04/2012    Procedure: LAPAROSCOPY DIAGNOSTIC;  Surgeon: Marylynn Pearson, MD;  Location: [facility];  Service: Gynecology;  Laterality: N/A;  with lysis of adhesions    History    Social History  . Marital Status: Married    Spouse Name: N/A  . Number of Children: 1  . Years of Education: N/A   Occupational History  .      House wife   Social History Main Topics  . Smoking status: Never Smoker   . Smokeless tobacco: Never Used  . Alcohol Use: Yes     Comment: occasionally   . Drug Use: No  . Sexual Activity: Yes    Birth Control/ Protection: Surgical   Other Topics Concern  . Not on file   Social History Narrative        Medication List       This list is accurate as of: 01/06/15  2:52 PM.  Always use your most recent med list.               citalopram 40 MG tablet  Commonly known as:  CELEXA  Take 1 tablet (40 mg total) by mouth daily.     diltiazem 120 MG 24 hr capsule  Commonly known as:  CARDIZEM CD  Take 1 capsule (120 mg total) by mouth daily.     levothyroxine 50 MCG tablet  Commonly known as:  SYNTHROID, LEVOTHROID  TAKE 1 TABLET (50 MCG TOTAL) BY MOUTH EVERY MORNING.     meloxicam 15 MG tablet  Commonly known as:  MOBIC  Take 15 mg by mouth daily. with food  ranitidine 150 MG tablet  Commonly known as:  ZANTAC  Take 150 mg by mouth at bedtime.     traMADol 50 MG tablet  Commonly known as:  ULTRAM  Take 1 tablet (50 mg total) by mouth every 8 (eight) hours as needed for pain.           Objective:   Physical Exam  Abdominal:     BP 142/98 mmHg  Pulse 103  Temp(Src) 98 F (36.7 C) (Oral)  Ht 5\' 5"  (1.651 m)  Wt 232 lb (105.235 kg)  BMI 38.61 kg/m2  SpO2 95%  LMP 12/15/2010 General:   Well developed, well nourished . NAD.  HEENT:  Normocephalic . Face symmetric, atraumatic. TMs bulge bilaterally but not red. Nose congested Maxillary sinuses with minimal TTP Lungs:  CTA B Normal respiratory effort, no intercostal retractions, no accessory muscle use. Heart: RRR,  no murmur. Heart rate around 100. no pretibial edema bilaterally  Abdomen:  Not distended, soft. No rebound or rigidity. No  mass,organomegaly. See graphic Skin: Not pale. Not jaundice Neurologic:  alert & oriented X3.  Speech normal, gait appropriate for age and unassisted MSK: Hip with normal rotation bilaterally  Psych--  Cognition and judgment appear intact.  Cooperative with normal attention span and concentration.  Behavior appropriate. No anxious or depressed appearing.    Assessment & Plan:   Right pelvis pain Pain is localized around the anterior superior iliac spine. Not really pain at the center of the right lower abdominal quadrant. Probably a muscle skeletal pain but an atypicalnpresentation of  appendicitis is also possible. Urinalysis showed a trace of blood. Plan: Tylenol or Motrin as needed Definitely call or go to urgent care in [place] (leaving tomorrow) if symptoms increase, fever, chills, nausea vomiting.  URI Avoid decongestants, she is slightly tachycardic today. Flonase and Mucinex recommended  BP slightly elevated, related to decongestants?Marland KitchenMarland Kitchen

## 2015-01-06 NOTE — Progress Notes (Signed)
Pre visit review using our clinic review tool, if applicable. No additional management support is needed unless otherwise documented below in the visit note. 

## 2015-01-07 LAB — URINE CULTURE
Colony Count: NO GROWTH
Organism ID, Bacteria: NO GROWTH

## 2015-01-12 ENCOUNTER — Other Ambulatory Visit: Payer: Self-pay | Admitting: Cardiology

## 2015-01-12 NOTE — Telephone Encounter (Signed)
Rx(s) sent to pharmacy electronically.  

## 2015-01-20 ENCOUNTER — Other Ambulatory Visit: Payer: Self-pay | Admitting: Family Medicine

## 2015-03-03 ENCOUNTER — Other Ambulatory Visit: Payer: Self-pay | Admitting: Family Medicine

## 2015-03-03 MED ORDER — LEVOTHYROXINE SODIUM 50 MCG PO TABS: ORAL_TABLET | ORAL | Status: DC

## 2015-03-11 ENCOUNTER — Encounter: Payer: Self-pay | Admitting: Family Medicine

## 2015-03-11 ENCOUNTER — Ambulatory Visit (INDEPENDENT_AMBULATORY_CARE_PROVIDER_SITE_OTHER): Payer: 59 | Admitting: Family Medicine

## 2015-03-11 VITALS — BP 120/82 | HR 82 | Temp 97.8°F | Ht 65.0 in | Wt 238.0 lb

## 2015-03-11 DIAGNOSIS — F418 Other specified anxiety disorders: Secondary | ICD-10-CM | POA: Diagnosis not present

## 2015-03-11 DIAGNOSIS — Z23 Encounter for immunization: Secondary | ICD-10-CM

## 2015-03-11 MED ORDER — VENLAFAXINE HCL ER 75 MG PO CP24: 75.0000 mg | ORAL_CAPSULE | Freq: Every day | ORAL | Status: DC

## 2015-03-11 NOTE — Patient Instructions (Signed)
Follow up in 3-4 weeks to recheck mood STOP the Celexa START the Effexor daily- take in the AM b/c it is more activating Call and set up an appt w/ Melinda Crutch [phone] for counseling Call with any questions or concerns Hang in there! You can do this!!!

## 2015-03-11 NOTE — Assessment & Plan Note (Signed)
Deteriorated.  Pt reports that she has 'eaten and slept through the last 3 years'.  Husband feels he 'lost his wife'.  Pt rationally knows that things need to change, but realistically doesn't have the desire or motivation to do so.  Based this, we will switch her medication to Effexor to improve motivation.  Also strongly recommended counseling to give her the constructs to reframe her thinking.  Pt denies SI/HI.  Will continue to follow closely.

## 2015-03-11 NOTE — Progress Notes (Signed)
   Subjective:    Patient ID: Ebony [name], female    DOB: Oct 19, 1965, 49 y.o.   MRN: [id number]  HPI Depression- ongoing issue for pt.  sxs started ~3 yrs ago after hysterectomy and passing of her mother.  On Celexa 40mg  daily.  'i'm still not myself'.  Husband said, 'i feel like i lost my wife 3 yrs ago'.  Pt reports she has gained 40 lbs- 'i've slept and eaten my way through the last 3 years'.  Pt is in denial.  Is avoiding emails or items that 'require something from me'.  Little focus or attention span.  'i don't want to do anything about it but I know I need to do something about it'.  Pt was previously in counseling- 'i felt like it was good'.     Review of Systems For ROS see HPI     Objective:   Physical Exam  Constitutional: She is oriented to person, place, and time. She appears well-developed and well-nourished. No distress.  HENT:  Head: Normocephalic and atraumatic.  Eyes: Conjunctivae and EOM are normal. Pupils are equal, round, and reactive to light.  Neurological: She is alert and oriented to person, place, and time.  Psychiatric: She has a normal mood and affect. Her behavior is normal. Thought content normal.  Vitals reviewed.         Assessment & Plan:

## 2015-03-11 NOTE — Progress Notes (Signed)
Pre visit review using our clinic review tool, if applicable. No additional management support is needed unless otherwise documented below in the visit note. 

## 2015-03-24 ENCOUNTER — Ambulatory Visit (INDEPENDENT_AMBULATORY_CARE_PROVIDER_SITE_OTHER): Payer: 59 | Admitting: Licensed Clinical Social Worker

## 2015-03-24 DIAGNOSIS — F331 Major depressive disorder, recurrent, moderate: Secondary | ICD-10-CM

## 2015-03-25 ENCOUNTER — Other Ambulatory Visit: Payer: Self-pay | Admitting: Family Medicine

## 2015-03-28 ENCOUNTER — Telehealth: Payer: Self-pay | Admitting: *Deleted

## 2015-03-28 NOTE — Telephone Encounter (Signed)
called pt and let her know that she can have the script for her diltiazem moved to [facility] from [facility] where it currently is, pt in agreement and said she would take care of it.

## 2015-04-06 ENCOUNTER — Ambulatory Visit (INDEPENDENT_AMBULATORY_CARE_PROVIDER_SITE_OTHER): Payer: 59 | Admitting: Licensed Clinical Social Worker

## 2015-04-06 DIAGNOSIS — F331 Major depressive disorder, recurrent, moderate: Secondary | ICD-10-CM

## 2015-04-18 ENCOUNTER — Ambulatory Visit (INDEPENDENT_AMBULATORY_CARE_PROVIDER_SITE_OTHER): Payer: 59 | Admitting: Licensed Clinical Social Worker

## 2015-04-18 DIAGNOSIS — F331 Major depressive disorder, recurrent, moderate: Secondary | ICD-10-CM

## 2015-05-06 ENCOUNTER — Ambulatory Visit (INDEPENDENT_AMBULATORY_CARE_PROVIDER_SITE_OTHER): Payer: 59 | Admitting: Licensed Clinical Social Worker

## 2015-05-06 DIAGNOSIS — F331 Major depressive disorder, recurrent, moderate: Secondary | ICD-10-CM

## 2015-05-18 ENCOUNTER — Ambulatory Visit (INDEPENDENT_AMBULATORY_CARE_PROVIDER_SITE_OTHER): Payer: 59 | Admitting: Licensed Clinical Social Worker

## 2015-05-18 DIAGNOSIS — F331 Major depressive disorder, recurrent, moderate: Secondary | ICD-10-CM

## 2015-06-09 ENCOUNTER — Ambulatory Visit (INDEPENDENT_AMBULATORY_CARE_PROVIDER_SITE_OTHER): Payer: 59 | Admitting: Licensed Clinical Social Worker

## 2015-06-09 DIAGNOSIS — F331 Major depressive disorder, recurrent, moderate: Secondary | ICD-10-CM

## 2015-06-13 ENCOUNTER — Other Ambulatory Visit (INDEPENDENT_AMBULATORY_CARE_PROVIDER_SITE_OTHER): Payer: 59

## 2015-06-13 DIAGNOSIS — R002 Palpitations: Secondary | ICD-10-CM | POA: Diagnosis not present

## 2015-06-13 DIAGNOSIS — R21 Rash and other nonspecific skin eruption: Secondary | ICD-10-CM

## 2015-06-13 DIAGNOSIS — E782 Mixed hyperlipidemia: Secondary | ICD-10-CM | POA: Diagnosis not present

## 2015-06-13 LAB — COMPREHENSIVE METABOLIC PANEL
ALBUMIN: 4.3 g/dL (ref 3.5–5.2)
ALT: 20 U/L (ref 0–35)
AST: 19 U/L (ref 0–37)
Alkaline Phosphatase: 79 U/L (ref 39–117)
BUN: 11 mg/dL (ref 6–23)
CHLORIDE: 104 meq/L (ref 96–112)
CO2: 27 mEq/L (ref 19–32)
Calcium: 9.8 mg/dL (ref 8.4–10.5)
Creatinine, Ser: 0.73 mg/dL (ref 0.40–1.20)
GFR: 90.68 mL/min (ref >60.00)
Glucose, Bld: 121 mg/dL — ABNORMAL HIGH (ref 70–99)
POTASSIUM: 3.7 meq/L (ref 3.5–5.1)
Sodium: 139 mEq/L (ref 135–145)
Total Bilirubin: 0.4 mg/dL (ref 0.2–1.2)
Total Protein: 7.1 g/dL (ref 6.0–8.3)

## 2015-06-13 LAB — CBC
HEMATOCRIT: 42.2 % (ref 36.0–46.0)
Hemoglobin: 13.9 g/dL (ref 12.0–15.0)
MCHC: 33 g/dL (ref 30.0–36.0)
MCV: 80.4 fl (ref 78.0–100.0)
Platelets: 335 10*3/uL (ref 150.0–400.0)
RBC: 5.25 Mil/uL — AB (ref 3.87–5.11)
RDW: 13.9 % (ref 11.5–15.5)
WBC: 10.3 10*3/uL (ref 4.0–10.5)

## 2015-06-13 LAB — LIPID PANEL
CHOL/HDL RATIO: 4
CHOLESTEROL: 182 mg/dL (ref 0–200)
HDL: 43.2 mg/dL (ref >39.00)
LDL Cholesterol: 100 mg/dL — ABNORMAL HIGH (ref 0–99)
NonHDL: 138.7
Triglycerides: 192 mg/dL — ABNORMAL HIGH (ref 0.0–149.0)
VLDL: 38.4 mg/dL (ref 0.0–40.0)

## 2015-06-13 LAB — TSH: TSH: 5.09 u[IU]/mL — ABNORMAL HIGH (ref 0.35–4.50)

## 2015-06-14 ENCOUNTER — Other Ambulatory Visit (INDEPENDENT_AMBULATORY_CARE_PROVIDER_SITE_OTHER): Payer: 59

## 2015-06-14 DIAGNOSIS — E119 Type 2 diabetes mellitus without complications: Secondary | ICD-10-CM

## 2015-06-14 LAB — HEMOGLOBIN A1C: Hgb A1c MFr Bld: 6.7 % — ABNORMAL HIGH (ref 4.6–6.5)

## 2015-06-20 ENCOUNTER — Ambulatory Visit (INDEPENDENT_AMBULATORY_CARE_PROVIDER_SITE_OTHER): Payer: 59 | Admitting: Family Medicine

## 2015-06-20 ENCOUNTER — Encounter: Payer: Self-pay | Admitting: Family Medicine

## 2015-06-20 VITALS — BP 120/82 | HR 91 | Temp 97.9°F | Ht 65.0 in | Wt 241.1 lb

## 2015-06-20 DIAGNOSIS — E039 Hypothyroidism, unspecified: Secondary | ICD-10-CM

## 2015-06-20 DIAGNOSIS — E119 Type 2 diabetes mellitus without complications: Secondary | ICD-10-CM | POA: Diagnosis not present

## 2015-06-20 DIAGNOSIS — E782 Mixed hyperlipidemia: Secondary | ICD-10-CM

## 2015-06-20 DIAGNOSIS — Z0000 Encounter for general adult medical examination without abnormal findings: Secondary | ICD-10-CM | POA: Diagnosis not present

## 2015-06-20 DIAGNOSIS — Z23 Encounter for immunization: Secondary | ICD-10-CM | POA: Diagnosis not present

## 2015-06-20 DIAGNOSIS — R002 Palpitations: Secondary | ICD-10-CM

## 2015-06-20 DIAGNOSIS — F418 Other specified anxiety disorders: Secondary | ICD-10-CM | POA: Diagnosis not present

## 2015-06-20 DIAGNOSIS — E785 Hyperlipidemia, unspecified: Secondary | ICD-10-CM

## 2015-06-20 MED ORDER — LEVOTHYROXINE SODIUM 75 MCG PO TABS: 75.0000 ug | ORAL_TABLET | Freq: Every day | ORAL | Status: DC

## 2015-06-20 NOTE — Assessment & Plan Note (Signed)
Under treated will increase Levothyroxine to 75 mcg daily

## 2015-06-20 NOTE — Assessment & Plan Note (Signed)
Patient encouraged to maintain heart healthy diet, regular exercise, adequate sleep. Consider daily probiotics. Take medications as prescribed. Labs reviewed 

## 2015-06-20 NOTE — Assessment & Plan Note (Signed)
Well controlled on diltiazem has some breakthrough but no associated symptoms

## 2015-06-20 NOTE — Assessment & Plan Note (Signed)
Good response to switch to Effexor continue same

## 2015-06-20 NOTE — Progress Notes (Signed)
Pre visit review using our clinic review tool, if applicable. No additional management support is needed unless otherwise documented below in the visit note. 

## 2015-06-20 NOTE — Assessment & Plan Note (Signed)
Encouraged heart healthy diet, increase exercise, avoid trans fats, consider a krill oil cap daily 

## 2015-06-20 NOTE — Patient Instructions (Addendum)
Hot compresses and benzoyl peroxide  Diabetes Mellitus and Food It is important for you to manage your blood sugar (glucose) level. Your blood glucose level can be greatly affected by what you eat. Eating healthier foods in the appropriate amounts throughout the day at about the same time each day will help you control your blood glucose level. It can also help slow or prevent worsening of your diabetes mellitus. Healthy eating may even help you improve the level of your blood pressure and reach or maintain a healthy weight.  General recommendations for healthful eating and cooking habits include:  Eating meals and snacks regularly. Avoid going long periods of time without eating to lose weight.  Eating a diet that consists mainly of plant-based foods, such as fruits, vegetables, nuts, legumes, and whole grains.  Using low-heat cooking methods, such as baking, instead of high-heat cooking methods, such as deep frying. Work with your dietitian to make sure you understand how to use the Nutrition Facts information on food labels. HOW CAN FOOD AFFECT ME? Carbohydrates Carbohydrates affect your blood glucose level more than any other type of food. Your dietitian will help you determine how many carbohydrates to eat at each meal and teach you how to count carbohydrates. Counting carbohydrates is important to keep your blood glucose at a healthy level, especially if you are using insulin or taking certain medicines for diabetes mellitus. Alcohol Alcohol can cause sudden decreases in blood glucose (hypoglycemia), especially if you use insulin or take certain medicines for diabetes mellitus. Hypoglycemia can be a life-threatening condition. Symptoms of hypoglycemia (sleepiness, dizziness, and disorientation) are similar to symptoms of having too much alcohol.  If your health care provider has given you approval to drink alcohol, do so in moderation and use the following guidelines:  Women should not have  more than one drink per day, and men should not have more than two drinks per day. One drink is equal to:  12 oz of beer.  5 oz of wine.  1 oz of hard liquor.  Do not drink on an empty stomach.  Keep yourself hydrated. Have water, diet soda, or unsweetened iced tea.  Regular soda, juice, and other mixers might contain a lot of carbohydrates and should be counted. WHAT FOODS ARE NOT RECOMMENDED? As you make food choices, it is important to remember that all foods are not the same. Some foods have fewer nutrients per serving than other foods, even though they might have the same number of calories or carbohydrates. It is difficult to get your body what it needs when you eat foods with fewer nutrients. Examples of foods that you should avoid that are high in calories and carbohydrates but low in nutrients include:  Trans fats (most processed foods list trans fats on the Nutrition Facts label).  Regular soda.  Juice.  Candy.  Sweets, such as cake, pie, doughnuts, and cookies.  Fried foods. WHAT FOODS CAN I EAT? Eat nutrient-rich foods, which will nourish your body and keep you healthy. The food you should eat also will depend on several factors, including:  The calories you need.  The medicines you take.  Your weight.  Your blood glucose level.  Your blood pressure level.  Your cholesterol level. You should eat a variety of foods, including:  Protein.  Lean cuts of meat.  Proteins low in saturated fats, such as fish, egg whites, and beans. Avoid processed meats.  Fruits and vegetables.  Fruits and vegetables that may help control blood glucose  levels, such as apples, mangoes, and yams.  Dairy products.  Choose fat-free or low-fat dairy products, such as milk, yogurt, and cheese.  Grains, bread, pasta, and rice.  Choose whole grain products, such as multigrain bread, whole oats, and brown rice. These foods may help control blood pressure.  Fats.  Foods  containing healthful fats, such as nuts, avocado, olive oil, canola oil, and fish. DOES EVERYONE WITH DIABETES MELLITUS HAVE THE SAME MEAL PLAN? Because every person with diabetes mellitus is different, there is not one meal plan that works for everyone. It is very important that you meet with a dietitian who will help you create a meal plan that is just right for you.   This information is not intended to replace advice given to you by your health care provider. Make sure you discuss any questions you have with your health care provider.   Document Released: 02/15/2005 Document Revised: 06/11/2014 Document Reviewed: 04/17/2013 Elsevier Interactive Patient Education [employer].

## 2015-06-20 NOTE — Progress Notes (Signed)
Subjective:    Patient ID: Ebony [name], female    DOB: 23-Sep-1965, 49 y.o.   MRN: [id number]  Chief Complaint  Patient presents with  . Annual Exam    HPI Patient is in today for annual exam and follow-up on numerous medical conditions. Overall she is feeling well. She continues to follow with Dr. Kathryne Eriksson of OB/GYN and Dr. Susie Cassette of dermatology. No recent illness. No acute complaints. Is trying to stay active and maintain a heart healthy diet. Denies CP/palp/SOB/HA/congestion/fevers/GI or GU c/o. Taking meds as prescribed  Past Medical History  Diagnosis Date  . GERD (gastroesophageal reflux disease)   . Hypertension     only with preg. 35yrs ago 2002- no meds  . Headache(784.0)     otc meds prn, migraine  . Chicken pox as a child  . Preventative health care 06/13/2012  . BCC (basal cell carcinoma), face     skin   . Hypothyroid 06/15/2012  . Depression with anxiety 10/06/2012  . Hyperlipidemia   . SVT (supraventricular tachycardia) ([place])   . Concussion with no loss of consciousness 12/28/2014  . Tinea pedis 12/28/2014  . Hyperlipidemia, mixed 12/28/2014    Past Surgical History  Procedure Laterality Date  . Tonsillectomy    . Cesarean section  2002  . Laparoscopic tubal ligation  01/03/2011    Procedure: LAPAROSCOPIC TUBAL LIGATION;  Surgeon: Marylynn Pearson;  Location: [facility];  Service: Gynecology;  Laterality: Bilateral;  with filshie clips  . Abdominal hysterectomy  03/04/2012    Procedure: HYSTERECTOMY ABDOMINAL;  Surgeon: Marylynn Pearson, MD;  Location: [facility];  Service: Gynecology;  Laterality: N/A;  with lysis of adhesions  . Laparoscopy  03/04/2012    Procedure: LAPAROSCOPY DIAGNOSTIC;  Surgeon: Marylynn Pearson, MD;  Location: [facility];  Service: Gynecology;  Laterality: N/A;  with lysis of adhesions    Family History  Problem Relation Age of Onset  . Cancer Mother     lung (06-13-11) breast -10 years ago  . Hypertension Mother   . Depression Mother   .  Cancer Brother     skin  . Other Maternal Grandmother     spinal stenosis  . Cancer Maternal Grandfather     prostate  . Emphysema Maternal Grandfather     Social History   Social History  . Marital Status: Married    Spouse Name: N/A  . Number of Children: 1  . Years of Education: N/A   Occupational History  .      House wife   Social History Main Topics  . Smoking status: Never Smoker   . Smokeless tobacco: Never Used  . Alcohol Use: Yes     Comment: occasionally   . Drug Use: No  . Sexual Activity: Yes    Birth Control/ Protection: Surgical   Other Topics Concern  . Not on file   Social History Narrative    Outpatient Prescriptions Prior to Visit  Medication Sig Dispense Refill  . diltiazem (CARDIZEM CD) 120 MG 24 hr capsule TAKE 1 CAPSULE (120 MG TOTAL) BY MOUTH DAILY. 90 capsule 3  . ranitidine (ZANTAC) 150 MG tablet Take 150 mg by mouth at bedtime.      Marland Kitchen venlafaxine XR (EFFEXOR-XR) 75 MG 24 hr capsule Take 1 capsule (75 mg total) by mouth daily with breakfast. 30 capsule 6  . levothyroxine (SYNTHROID, LEVOTHROID) 50 MCG tablet TAKE 1 TABLET (50 MCG TOTAL) BY MOUTH EVERY MORNING. 90 tablet 2  . traMADol (ULTRAM)  50 MG tablet Take 1 tablet (50 mg total) by mouth every 8 (eight) hours as needed for pain. (Patient not taking: Reported on 06/20/2015) 40 tablet 1  . citalopram (CELEXA) 40 MG tablet TAKE 1 TABLET (40 MG TOTAL) BY MOUTH DAILY. 30 tablet 5  . meloxicam (MOBIC) 15 MG tablet Take 15 mg by mouth daily. with food  3   No facility-administered medications prior to visit.    No Known Allergies  Review of Systems  Constitutional: Negative for fever, chills and malaise/fatigue.  HENT: Negative for congestion and hearing loss.   Eyes: Negative for discharge.  Respiratory: Negative for cough, sputum production and shortness of breath.   Cardiovascular: Negative for chest pain, palpitations and leg swelling.  Gastrointestinal: Negative for heartburn,  nausea, vomiting, abdominal pain, diarrhea, constipation and blood in stool.  Genitourinary: Negative for dysuria, urgency, frequency and hematuria.  Musculoskeletal: Negative for myalgias, back pain and falls.  Skin: Negative for rash.  Neurological: Negative for dizziness, sensory change, loss of consciousness, weakness and headaches.  Endo/Heme/Allergies: Negative for environmental allergies. Does not bruise/bleed easily.  Psychiatric/Behavioral: Negative for depression and suicidal ideas. The patient is not nervous/anxious and does not have insomnia.        Objective:    Physical Exam  Constitutional: She is oriented to person, place, and time. She appears well-developed and well-nourished. No distress.  HENT:  Head: Normocephalic and atraumatic.  Eyes: Conjunctivae are normal.  Neck: Neck supple. No thyromegaly present.  Cardiovascular: Normal rate, regular rhythm and normal heart sounds.   No murmur heard. Pulmonary/Chest: Effort normal and breath sounds normal. No respiratory distress.  Abdominal: Soft. Bowel sounds are normal. She exhibits no distension and no mass. There is no tenderness.  Musculoskeletal: She exhibits no edema.  Lymphadenopathy:    She has no cervical adenopathy.  Neurological: She is alert and oriented to person, place, and time.  Skin: Skin is warm and dry.  Psychiatric: She has a normal mood and affect. Her behavior is normal.    BP 120/82 mmHg  Pulse 91  Temp(Src) 97.9 F (36.6 C) (Oral)  Ht 5\' 5"  (1.651 m)  Wt 241 lb 2 oz (109.374 kg)  BMI 40.13 kg/m2  SpO2 93%  LMP 12/15/2010 Wt Readings from Last 3 Encounters:  06/20/15 241 lb 2 oz (109.374 kg)  03/11/15 238 lb (107.956 kg)  01/06/15 232 lb (105.235 kg)     Lab Results  Component Value Date   WBC 10.3 06/13/2015   HGB 13.9 06/13/2015   HCT 42.2 06/13/2015   PLT 335.0 06/13/2015   GLUCOSE 121* 06/13/2015   CHOL 182 06/13/2015   TRIG 192.0* 06/13/2015   HDL 43.20 06/13/2015    LDLCALC 100* 06/13/2015   ALT 20 06/13/2015   AST 19 06/13/2015   NA 139 06/13/2015   K 3.7 06/13/2015   CL 104 06/13/2015   CREATININE 0.73 06/13/2015   BUN 11 06/13/2015   CO2 27 06/13/2015   TSH 5.09* 06/13/2015   HGBA1C 6.7* 06/14/2015    Lab Results  Component Value Date   TSH 5.09* 06/13/2015   Lab Results  Component Value Date   WBC 10.3 06/13/2015   HGB 13.9 06/13/2015   HCT 42.2 06/13/2015   MCV 80.4 06/13/2015   PLT 335.0 06/13/2015   Lab Results  Component Value Date   NA 139 06/13/2015   K 3.7 06/13/2015   CO2 27 06/13/2015   GLUCOSE 121* 06/13/2015   BUN 11 06/13/2015  CREATININE 0.73 06/13/2015   BILITOT 0.4 06/13/2015   ALKPHOS 79 06/13/2015   AST 19 06/13/2015   ALT 20 06/13/2015   PROT 7.1 06/13/2015   ALBUMIN 4.3 06/13/2015   CALCIUM 9.8 06/13/2015   GFR 90.68 06/13/2015   Lab Results  Component Value Date   CHOL 182 06/13/2015   Lab Results  Component Value Date   HDL 43.20 06/13/2015   Lab Results  Component Value Date   LDLCALC 100* 06/13/2015   Lab Results  Component Value Date   TRIG 192.0* 06/13/2015   Lab Results  Component Value Date   CHOLHDL 4 06/13/2015   Lab Results  Component Value Date   HGBA1C 6.7* 06/14/2015       Assessment & Plan:   Problem List Items Addressed This Visit    Depression with anxiety    Good response to switch to Effexor continue same      Hyperlipidemia, mixed    Encouraged heart healthy diet, increase exercise, avoid trans fats, consider a krill oil cap daily      Hypothyroid    Under treated will increase Levothyroxine to 75 mcg daily      Relevant Medications   levothyroxine (SYNTHROID, LEVOTHROID) 75 MCG tablet   Palpitations    Well controlled on diltiazem has some breakthrough but no associated symptoms      Relevant Orders   TSH   Comprehensive metabolic panel   CBC   Lipid panel   Preventative health care    Patient encouraged to maintain heart healthy diet,  regular exercise, adequate sleep. Consider daily probiotics. Take medications as prescribed. Labs reviewed      Type 2 diabetes mellitus without complication, without long-term current use of insulin (HCC)    hgba1c acceptable, minimize simple carbs. Increase exercise as tolerated. Continue current meds      Relevant Orders   Ambulatory referral to Nutrition and Diabetic Education   Microalbumin / creatinine urine ratio   Hemoglobin A1c    Other Visit Diagnoses    Need for prophylactic vaccination against Streptococcus pneumoniae (pneumococcus)    -  Primary    Relevant Orders    Pneumococcal conjugate vaccine 13-valent IM (Completed)    Hyperlipemia        Relevant Orders    TSH    Comprehensive metabolic panel    CBC    Lipid panel       I have discontinued Ms. Koelling's meloxicam, levothyroxine, and citalopram. I am also having her start on levothyroxine. Additionally, I am having her maintain her ranitidine, traMADol, diltiazem, and venlafaxine XR.  Meds ordered this encounter  Medications  . levothyroxine (SYNTHROID, LEVOTHROID) 75 MCG tablet    Sig: Take 1 tablet (75 mcg total) by mouth daily before breakfast.    Dispense:  90 tablet    Refill:  1     Willette Alma, MD

## 2015-06-23 ENCOUNTER — Ambulatory Visit (INDEPENDENT_AMBULATORY_CARE_PROVIDER_SITE_OTHER): Payer: 59 | Admitting: Licensed Clinical Social Worker

## 2015-06-23 DIAGNOSIS — F331 Major depressive disorder, recurrent, moderate: Secondary | ICD-10-CM

## 2015-06-26 NOTE — Assessment & Plan Note (Signed)
hgba1c acceptable, minimize simple carbs. Increase exercise as tolerated. Continue current meds 

## 2015-07-05 ENCOUNTER — Encounter: Payer: 59 | Attending: Family Medicine

## 2015-07-05 VITALS — Ht 65.0 in | Wt 237.0 lb

## 2015-07-05 DIAGNOSIS — E119 Type 2 diabetes mellitus without complications: Secondary | ICD-10-CM | POA: Insufficient documentation

## 2015-07-08 ENCOUNTER — Telehealth: Payer: Self-pay | Admitting: Family Medicine

## 2015-07-08 MED ORDER — ONETOUCH DELICA LANCETS 33G MISC: Status: DC

## 2015-07-08 MED ORDER — GLUCOSE BLOOD VI STRP: ORAL_STRIP | Status: DC

## 2015-07-08 NOTE — Telephone Encounter (Signed)
Called the patient to confirm what strips/lancets needed.  Sent in to her local pharmacy

## 2015-07-08 NOTE — Progress Notes (Signed)
Patient was seen on 1/311/17 for the first of a series of three diabetes self-management courses at [facility].  Patient Education Plan per assessed needs and concerns is to attend four course education program for Diabetes Self Management Education.  The following learning objectives were met by the patient during this class:  Describe diabetes  State some common risk factors for diabetes  Defines the role of glucose and insulin  Identifies type of diabetes and pathophysiology  Describe the relationship between diabetes and cardiovascular risk  State the members of the Healthcare Team  States the rationale for glucose monitoring  State when to test glucose  State their individual Target Range  State the importance of logging glucose readings  Describe how to interpret glucose readings  Identifies A1C target  Explain the correlation between A1c and eAG values  State symptoms and treatment of high blood glucose  State symptoms and treatment of low blood glucose  Explain proper technique for glucose testing  Identifies proper sharps disposal  Handouts given during class include:  Living Well with Diabetes book  Carb Counting and Meal Planning book  Meal Plan Card  Carbohydrate guide  Meal planning worksheet  Low Sodium Flavoring Tips  The diabetes portion plate  O1V to eAG Conversion Chart  Diabetes Medications  Diabetes Recommended Care Schedule  Support Group  Diabetes Success Plan  Core Class Satisfaction Survey  Follow-Up Plan:  Attend core 2

## 2015-07-08 NOTE — Telephone Encounter (Signed)
Relation to [id number] Call back number:[phone] Pharmacy: [id number] - [place], [place] [phone] (Phone) [phone] (Fax)         Reason for call:  Patient is completely out of her one touch valero flex diabetic supplies lancets and test strips

## 2015-07-12 ENCOUNTER — Encounter: Payer: 59 | Attending: Family Medicine

## 2015-07-12 DIAGNOSIS — E119 Type 2 diabetes mellitus without complications: Secondary | ICD-10-CM | POA: Diagnosis not present

## 2015-07-12 NOTE — Progress Notes (Signed)

## 2015-07-19 DIAGNOSIS — E119 Type 2 diabetes mellitus without complications: Secondary | ICD-10-CM | POA: Diagnosis not present

## 2015-07-19 NOTE — Progress Notes (Signed)
Patient was seen on 07/19/15 for the third of a series of three diabetes self-management courses at the [facility].   Ebony [name] the amount of activity recommended for healthy living . Describe activities suitable for individual needs . Identify ways to regularly incorporate activity into daily life . Identify barriers to activity and ways to over come these barriers  Identify diabetes medications being personally used and their primary action for lowering glucose and possible side effects . Describe role of stress on blood glucose and develop strategies to address psychosocial issues . Identify diabetes complications and ways to prevent them  Explain how to manage diabetes during illness . Evaluate success in meeting personal goal . Establish 2-3 goals that they will plan to diligently work on until they return for the  38-month follow-up visit  Goals:   I will count my carb choices at most meals and snacks  I will increase my activity as tolerated  I will eat less unhealthy fats by eating less fried foods  I will test my glucose at least 1 times a day, 7 days a week  I will look at patterns in my record book at least 4 days a month  To help manage stress I will  Take yoga at least 1 times a week  Find more time to relax  Your patient has identified these potential barriers to change:  Motivation  Your patient has identified their diabetes self-care support plan as  [facility] Support [facility] Family Support On-line Resources Plan:  Attend Optional Core 4 in 4 months

## 2015-07-21 ENCOUNTER — Ambulatory Visit (INDEPENDENT_AMBULATORY_CARE_PROVIDER_SITE_OTHER): Payer: 59 | Admitting: Licensed Clinical Social Worker

## 2015-07-21 DIAGNOSIS — F331 Major depressive disorder, recurrent, moderate: Secondary | ICD-10-CM

## 2015-08-24 ENCOUNTER — Other Ambulatory Visit: Payer: Self-pay | Admitting: Family Medicine

## 2015-08-29 ENCOUNTER — Other Ambulatory Visit (INDEPENDENT_AMBULATORY_CARE_PROVIDER_SITE_OTHER): Payer: 59

## 2015-08-29 DIAGNOSIS — R002 Palpitations: Secondary | ICD-10-CM | POA: Diagnosis not present

## 2015-08-29 DIAGNOSIS — E119 Type 2 diabetes mellitus without complications: Secondary | ICD-10-CM

## 2015-08-29 DIAGNOSIS — E785 Hyperlipidemia, unspecified: Secondary | ICD-10-CM | POA: Diagnosis not present

## 2015-08-29 LAB — COMPREHENSIVE METABOLIC PANEL
ALBUMIN: 4.3 g/dL (ref 3.5–5.2)
ALT: 18 U/L (ref 0–35)
AST: 19 U/L (ref 0–37)
Alkaline Phosphatase: 63 U/L (ref 39–117)
BUN: 16 mg/dL (ref 6–23)
CALCIUM: 9.9 mg/dL (ref 8.4–10.5)
CHLORIDE: 104 meq/L (ref 96–112)
CO2: 25 mEq/L (ref 19–32)
CREATININE: 0.77 mg/dL (ref 0.40–1.20)
GFR: 85.19 mL/min (ref >60.00)
Glucose, Bld: 99 mg/dL (ref 70–99)
POTASSIUM: 3.9 meq/L (ref 3.5–5.1)
Sodium: 137 mEq/L (ref 135–145)
Total Bilirubin: 0.5 mg/dL (ref 0.2–1.2)
Total Protein: 7.6 g/dL (ref 6.0–8.3)

## 2015-08-29 LAB — CBC
HEMATOCRIT: 43.1 % (ref 36.0–46.0)
Hemoglobin: 14.4 g/dL (ref 12.0–15.0)
MCHC: 33.4 g/dL (ref 30.0–36.0)
MCV: 79.7 fl (ref 78.0–100.0)
PLATELETS: 307 10*3/uL (ref 150.0–400.0)
RBC: 5.4 Mil/uL — AB (ref 3.87–5.11)
RDW: 15 % (ref 11.5–15.5)
WBC: 10.2 10*3/uL (ref 4.0–10.5)

## 2015-08-29 LAB — LIPID PANEL
Cholesterol: 180 mg/dL (ref 0–200)
HDL: 48.9 mg/dL (ref >39.00)
LDL CALC: 107 mg/dL — AB (ref 0–99)
NonHDL: 131.51
Total CHOL/HDL Ratio: 4
Triglycerides: 125 mg/dL (ref 0.0–149.0)
VLDL: 25 mg/dL (ref 0.0–40.0)

## 2015-08-29 LAB — HEMOGLOBIN A1C: Hgb A1c MFr Bld: 6.1 % (ref 4.6–6.5)

## 2015-08-29 LAB — TSH: TSH: 1.3 u[IU]/mL (ref 0.35–4.50)

## 2015-09-01 ENCOUNTER — Ambulatory Visit (INDEPENDENT_AMBULATORY_CARE_PROVIDER_SITE_OTHER): Payer: 59 | Admitting: Licensed Clinical Social Worker

## 2015-09-01 DIAGNOSIS — F331 Major depressive disorder, recurrent, moderate: Secondary | ICD-10-CM

## 2015-09-05 ENCOUNTER — Ambulatory Visit (INDEPENDENT_AMBULATORY_CARE_PROVIDER_SITE_OTHER): Payer: 59 | Admitting: Family Medicine

## 2015-09-05 ENCOUNTER — Ambulatory Visit (HOSPITAL_BASED_OUTPATIENT_CLINIC_OR_DEPARTMENT_OTHER)
Admission: RE | Admit: 2015-09-05 | Discharge: 2015-09-05 | Disposition: A | Discharge status: Home / Self Care | Payer: 59 | Source: Ambulatory Visit | Attending: Family Medicine | Admitting: Family Medicine

## 2015-09-05 ENCOUNTER — Encounter: Payer: Self-pay | Admitting: Family Medicine

## 2015-09-05 VITALS — BP 110/72 | HR 83 | Temp 98.0°F | Ht 65.0 in | Wt 222.5 lb

## 2015-09-05 DIAGNOSIS — Z0000 Encounter for general adult medical examination without abnormal findings: Secondary | ICD-10-CM

## 2015-09-05 DIAGNOSIS — F418 Other specified anxiety disorders: Secondary | ICD-10-CM | POA: Insufficient documentation

## 2015-09-05 DIAGNOSIS — K219 Gastro-esophageal reflux disease without esophagitis: Secondary | ICD-10-CM | POA: Diagnosis not present

## 2015-09-05 DIAGNOSIS — M79662 Pain in left lower leg: Secondary | ICD-10-CM

## 2015-09-05 DIAGNOSIS — E119 Type 2 diabetes mellitus without complications: Secondary | ICD-10-CM | POA: Diagnosis not present

## 2015-09-05 DIAGNOSIS — I471 Supraventricular tachycardia: Secondary | ICD-10-CM

## 2015-09-05 DIAGNOSIS — E669 Obesity, unspecified: Secondary | ICD-10-CM | POA: Diagnosis not present

## 2015-09-05 HISTORY — DX: Obesity, unspecified: E66.9

## 2015-09-05 HISTORY — DX: Gastro-esophageal reflux disease without esophagitis: K21.9

## 2015-09-05 LAB — MICROALBUMIN / CREATININE URINE RATIO
Creatinine,U: 202.7 mg/dL
Microalb Creat Ratio: 0.8 mg/g (ref 0.0–30.0)
Microalb, Ur: 1.6 mg/dL (ref 0.0–1.9)

## 2015-09-05 NOTE — Progress Notes (Signed)
Subjective:    Patient ID: Ebony [name], female    DOB: Aug 02, 1965, 50 y.o.   MRN: [id number]  Chief Complaint  Patient presents with  . Follow-up    diabetes    HPI Patient is in today for follow up with diabetes follow up.  Labs have improved and A1C  levels have went down.  Patient has a recent weight loss, has improved diet and exercising.  Patient reports having some concern with muscle in leg in mid flexion, more problems with movement but has improved some with wearing a boot. No injury redness or warmth Patient does have Super Ventricular Tachycardia but is currently taking medication to treat,  Has episodes twice a month but she has been seeing cardiologist to treat, Dr. Stanford Breed gave her some options to have an ablation but she is curerntly considering her options as to how to continue treating SVT.  Denies CP/palp/SOB/HA/congestion/fevers/GI or GU c/o. Taking meds as prescribed.         Past Medical History  Diagnosis Date  . GERD (gastroesophageal reflux disease)   . Hypertension     only with preg. 23yrs ago 2002- no meds  . Headache(784.0)     otc meds prn, migraine  . Chicken pox as a child  . Preventative health care 06/13/2012  . BCC (basal cell carcinoma), face     skin   . Hypothyroid 06/15/2012  . Depression with anxiety 10/06/2012  . Hyperlipidemia   . SVT (supraventricular tachycardia) ([place])   . Concussion with no loss of consciousness 12/28/2014  . Tinea pedis 12/28/2014  . Hyperlipidemia, mixed 12/28/2014  . Diabetes mellitus without complication ([place])   . Obesity 09/05/2015  . Pain of left calf 10/01/2014  . GERD (gastroesophageal reflux disease) 09/05/2015    Past Surgical History  Procedure Laterality Date  . Tonsillectomy    . Cesarean section  2002  . Laparoscopic tubal ligation  01/03/2011    Procedure: LAPAROSCOPIC TUBAL LIGATION;  Surgeon: Marylynn Pearson;  Location: [facility];  Service: Gynecology;  Laterality: Bilateral;  with filshie clips  .  Abdominal hysterectomy  03/04/2012    Procedure: HYSTERECTOMY ABDOMINAL;  Surgeon: Marylynn Pearson, MD;  Location: [facility];  Service: Gynecology;  Laterality: N/A;  with lysis of adhesions  . Laparoscopy  03/04/2012    Procedure: LAPAROSCOPY DIAGNOSTIC;  Surgeon: Marylynn Pearson, MD;  Location: [facility];  Service: Gynecology;  Laterality: N/A;  with lysis of adhesions    Family History  Problem Relation Age of Onset  . Cancer Mother     lung (06-13-11) breast -10 years ago  . Hypertension Mother   . Depression Mother   . Cancer Brother     skin  . Other Maternal Grandmother     spinal stenosis  . Cancer Maternal Grandfather     prostate  . Emphysema Maternal Grandfather     Social History   Social History  . Marital Status: Married    Spouse Name: N/A  . Number of Children: 1  . Years of Education: N/A   Occupational History  .      House wife   Social History Main Topics  . Smoking status: Never Smoker   . Smokeless tobacco: Never Used  . Alcohol Use: Yes     Comment: occasionally   . Drug Use: No  . Sexual Activity: Yes    Birth Control/ Protection: Surgical   Other Topics Concern  . Not on file   Social History Narrative  Outpatient Prescriptions Prior to Visit  Medication Sig Dispense Refill  . diltiazem (CARDIZEM CD) 120 MG 24 hr capsule TAKE 1 CAPSULE (120 MG TOTAL) BY MOUTH DAILY. 90 capsule 3  . glucose blood (ONETOUCH VERIO) test strip Test once daily to check blood sugar.  DX 11.9 50 each 6  . levothyroxine (SYNTHROID, LEVOTHROID) 75 MCG tablet TAKE 1 TABLET (75 MCG TOTAL) BY MOUTH DAILY BEFORE BREAKFAST. 90 tablet 0  . ONETOUCH DELICA LANCETS 99991111 MISC Test once daily to check blood sugar.  DX 11.9 50 each 6  . ranitidine (ZANTAC) 150 MG tablet Take 150 mg by mouth at bedtime.      . traMADol (ULTRAM) 50 MG tablet Take 1 tablet (50 mg total) by mouth every 8 (eight) hours as needed for pain. 40 tablet 1  . venlafaxine XR (EFFEXOR-XR) 75 MG 24 hr capsule  Take 1 capsule (75 mg total) by mouth daily with breakfast. 30 capsule 6   No facility-administered medications prior to visit.    No Known Allergies  Review of Systems  Constitutional: Negative for fever and malaise/fatigue.  HENT: Negative for congestion.   Eyes: Negative for blurred vision.  Respiratory: Negative for shortness of breath.   Cardiovascular: Negative for chest pain, palpitations and leg swelling.  Gastrointestinal: Negative for nausea, abdominal pain and blood in stool.  Genitourinary: Negative for dysuria and frequency.  Musculoskeletal: Positive for myalgias. Negative for falls.       Left calf pain  Skin: Negative for rash.  Neurological: Negative for dizziness, loss of consciousness and headaches.  Endo/Heme/Allergies: Negative for environmental allergies.  Psychiatric/Behavioral: Negative for depression. The patient is not nervous/anxious.        Objective:    Physical Exam  Constitutional: She is oriented to person, place, and time. She appears well-developed and well-nourished. No distress.  HENT:  Head: Normocephalic and atraumatic.  Eyes: Conjunctivae are normal.  Neck: Neck supple. No thyromegaly present.  Cardiovascular: Normal rate, regular rhythm and normal heart sounds.   No murmur heard. Pulmonary/Chest: Effort normal and breath sounds normal. No respiratory distress.  Abdominal: Soft. Bowel sounds are normal. She exhibits no distension and no mass. There is no tenderness.  Musculoskeletal: She exhibits no edema.  Lymphadenopathy:    She has no cervical adenopathy.  Neurological: She is alert and oriented to person, place, and time.  Skin: Skin is warm and dry.  Psychiatric: She has a normal mood and affect. Her behavior is normal.    BP 110/72 mmHg  Pulse 83  Temp(Src) 98 F (36.7 C) (Oral)  Ht 5\' 5"  (1.651 m)  Wt 222 lb 8 oz (100.925 kg)  BMI 37.03 kg/m2  SpO2 96%  LMP 12/15/2010 Wt Readings from Last 3 Encounters:  09/05/15 222  lb 8 oz (100.925 kg)  07/08/15 237 lb (107.502 kg)  06/20/15 241 lb 2 oz (109.374 kg)     Lab Results  Component Value Date   WBC 10.2 08/29/2015   HGB 14.4 08/29/2015   HCT 43.1 08/29/2015   PLT 307.0 08/29/2015   GLUCOSE 99 08/29/2015   CHOL 180 08/29/2015   TRIG 125.0 08/29/2015   HDL 48.90 08/29/2015   LDLCALC 107* 08/29/2015   ALT 18 08/29/2015   AST 19 08/29/2015   NA 137 08/29/2015   K 3.9 08/29/2015   CL 104 08/29/2015   CREATININE 0.77 08/29/2015   BUN 16 08/29/2015   CO2 25 08/29/2015   TSH 1.30 08/29/2015   HGBA1C 6.1 08/29/2015  Lab Results  Component Value Date   TSH 1.30 08/29/2015   Lab Results  Component Value Date   WBC 10.2 08/29/2015   HGB 14.4 08/29/2015   HCT 43.1 08/29/2015   MCV 79.7 08/29/2015   PLT 307.0 08/29/2015   Lab Results  Component Value Date   NA 137 08/29/2015   K 3.9 08/29/2015   CO2 25 08/29/2015   GLUCOSE 99 08/29/2015   BUN 16 08/29/2015   CREATININE 0.77 08/29/2015   BILITOT 0.5 08/29/2015   ALKPHOS 63 08/29/2015   AST 19 08/29/2015   ALT 18 08/29/2015   PROT 7.6 08/29/2015   ALBUMIN 4.3 08/29/2015   CALCIUM 9.9 08/29/2015   GFR 85.19 08/29/2015   Lab Results  Component Value Date   CHOL 180 08/29/2015   Lab Results  Component Value Date   HDL 48.90 08/29/2015   Lab Results  Component Value Date   LDLCALC 107* 08/29/2015   Lab Results  Component Value Date   TRIG 125.0 08/29/2015   Lab Results  Component Value Date   CHOLHDL 4 08/29/2015   Lab Results  Component Value Date   HGBA1C 6.1 08/29/2015       Assessment & Plan:   Problem List Items Addressed This Visit    Depression with anxiety    Doing well on current meds      Relevant Orders   US Venous Img Lower Unilateral Left   GERD (gastroesophageal reflux disease) - Primary    Avoid offending foods, start probiotics. Do not eat large meals in late evening and consider raising head of bed. Zantac prn      Obesity    Great  weight loss with diet and exercise. Encouraged DASH diet, decrease po intake and increase exercise as tolerated. Needs 7-8 hours of sleep nightly. Avoid trans fats, eat small, frequent meals every 4-5 hours with lean proteins, complex carbs and healthy fats. Minimize simple carbs, GMO foods.      Relevant Orders   US Venous Img Lower Unilateral Left   Pain of left calf    Mid flexion, will check a ultrasound, it is doing better with immobilization in a boot. Consider sports med if persists. Try topical treatments such as Salon Pas.       Relevant Orders   US Venous Img Lower Unilateral Left   Preventative health care   Relevant Orders   Microalbumin / creatinine urine ratio   US Venous Img Lower Unilateral Left   SVT (supraventricular tachycardia) (HCC)    Has episodes a couple of times a month, can last minutes to hours. Is following with cardiology, so far has chosen not to proceed with ablation. Watch caffeine       Relevant Orders   US Venous Img Lower Unilateral Left   Type 2 diabetes mellitus without complication, without long-term current use of insulin (HCC)    hgba1c acceptable, minimize simple carbs. Increase exercise as tolerated. Continue current meds. Will recent eye exam.      Relevant Orders   Microalbumin / creatinine urine ratio   US Venous Img Lower Unilateral Left      I am having Ms. Funderburg maintain her ranitidine, traMADol, diltiazem, venlafaxine XR, glucose blood, ONETOUCH DELICA LANCETS 99991111, and levothyroxine.  No orders of the defined types were placed in this encounter.     Penni Homans, MD

## 2015-09-05 NOTE — Assessment & Plan Note (Addendum)
hgba1c acceptable, minimize simple carbs. Increase exercise as tolerated. Continue current meds. Will recent eye exam.

## 2015-09-05 NOTE — Assessment & Plan Note (Signed)
On Levothyroxine, continue to monitor 

## 2015-09-05 NOTE — Assessment & Plan Note (Signed)
Avoid offending foods, start probiotics. Do not eat large meals in late evening and consider raising head of bed. Zantac prn

## 2015-09-05 NOTE — Assessment & Plan Note (Signed)
Has episodes a couple of times a month, can last minutes to hours. Is following with cardiology, so far has chosen not to proceed with ablation. Watch caffeine

## 2015-09-05 NOTE — Patient Instructions (Signed)
Salonpas with lidocaine and aspercreme.  Basic Carbohydrate Counting for Diabetes Mellitus Carbohydrate counting is a method for keeping track of the amount of carbohydrates you eat. Eating carbohydrates naturally increases the level of sugar (glucose) in your blood, so it is important for you to know the amount that is okay for you to have in every meal. Carbohydrate counting helps keep the level of glucose in your blood within normal limits. The amount of carbohydrates allowed is different for every person. A dietitian can help you calculate the amount that is right for you. Once you know the amount of carbohydrates you can have, you can count the carbohydrates in the foods you want to eat. Carbohydrates are found in the following foods:  Grains, such as breads and cereals.  Dried beans and soy products.  Starchy vegetables, such as potatoes, peas, and corn.  Fruit and fruit juices.  Milk and yogurt.  Sweets and snack foods, such as cake, cookies, candy, chips, soft drinks, and fruit drinks. CARBOHYDRATE COUNTING There are two ways to count the carbohydrates in your food. You can use either of the methods or a combination of both. Reading the "Nutrition Facts" on [place] The "Nutrition Facts" is an area that is included on the labels of almost all packaged food and beverages in the [place]. It includes the serving size of that food or beverage and information about the nutrients in each serving of the food, including the grams (g) of carbohydrate per serving.  Decide the number of servings of this food or beverage that you will be able to eat or drink. Multiply that number of servings by the number of grams of carbohydrate that is listed on the label for that serving. The total will be the amount of carbohydrates you will be having when you eat or drink this food or beverage. Learning Standard Serving Sizes of Food When you eat food that is not packaged or does not include  "Nutrition Facts" on the label, you need to measure the servings in order to count the amount of carbohydrates.A serving of most carbohydrate-rich foods contains about 15 g of carbohydrates. The following list includes serving sizes of carbohydrate-rich foods that provide 15 g ofcarbohydrate per serving:   1 slice of bread (1 oz) or 1 six-inch tortilla.    of a hamburger bun or English muffin.  4-6 crackers.   cup unsweetened dry cereal.    cup hot cereal.   cup rice or pasta.    cup mashed potatoes or  of a large baked potato.  1 cup fresh fruit or one small piece of fruit.    cup canned or frozen fruit or fruit juice.  1 cup milk.   cup plain fat-free yogurt or yogurt sweetened with artificial sweeteners.   cup cooked dried beans or starchy vegetable, such as peas, corn, or potatoes.  Decide the number of standard-size servings that you will eat. Multiply that number of servings by 15 (the grams of carbohydrates in that serving). For example, if you eat 2 cups of strawberries, you will have eaten 2 servings and 30 g of carbohydrates (2 servings x 15 g = 30 g). For foods such as soups and casseroles, in which more than one food is mixed in, you will need to count the carbohydrates in each food that is included. EXAMPLE OF CARBOHYDRATE COUNTING Sample Dinner  3 oz chicken breast.   cup of brown rice.   cup of corn.  1 cup milk.  1 cup strawberries with sugar-free whipped topping.  Carbohydrate Calculation Step 1: Identify the foods that contain carbohydrates:   Rice.   Corn.   Milk.   Strawberries. Step 2:Calculate the number of servings eaten of each:   2 servings of rice.   1 serving of corn.   1 serving of milk.   1 serving of strawberries. Step 3: Multiply each of those number of servings by 15 g:   2 servings of rice x 15 g = 30 g.   1 serving of corn x 15 g = 15 g.   1 serving of milk x 15 g = 15 g.   1 serving of  strawberries x 15 g = 15 g. Step 4: Add together all of the amounts to find the total grams of carbohydrates eaten: 30 g + 15 g + 15 g + 15 g = 75 g.   This information is not intended to replace advice given to you by your health care provider. Make sure you discuss any questions you have with your health care provider.   Document Released: 05/21/2005 Document Revised: 06/11/2014 Document Reviewed: 04/17/2013 Elsevier Interactive Patient Education [employer].

## 2015-09-05 NOTE — Assessment & Plan Note (Signed)
Doing well on current meds.

## 2015-09-05 NOTE — Progress Notes (Signed)
Pre visit review using our clinic review tool, if applicable. No additional management support is needed unless otherwise documented below in the visit note. 

## 2015-09-05 NOTE — Assessment & Plan Note (Signed)
Great weight loss with diet and exercise. Encouraged DASH diet, decrease po intake and increase exercise as tolerated. Needs 7-8 hours of sleep nightly. Avoid trans fats, eat small, frequent meals every 4-5 hours with lean proteins, complex carbs and healthy fats. Minimize simple carbs, GMO foods.

## 2015-09-05 NOTE — Assessment & Plan Note (Signed)
Encouraged heart healthy diet, increase exercise, avoid trans fats, consider a krill oil cap daily 

## 2015-09-05 NOTE — Assessment & Plan Note (Signed)
Mid flexion, will check a ultrasound, it is doing better with immobilization in a boot. Consider sports med if persists. Try topical treatments such as Salon Pas.

## 2015-10-13 ENCOUNTER — Ambulatory Visit: Payer: 59 | Admitting: Licensed Clinical Social Worker

## 2015-10-18 ENCOUNTER — Other Ambulatory Visit: Payer: Self-pay | Admitting: Family Medicine

## 2015-10-18 ENCOUNTER — Ambulatory Visit (INDEPENDENT_AMBULATORY_CARE_PROVIDER_SITE_OTHER): Payer: 59 | Admitting: Licensed Clinical Social Worker

## 2015-10-18 DIAGNOSIS — F3341 Major depressive disorder, recurrent, in partial remission: Secondary | ICD-10-CM

## 2015-10-18 MED ORDER — VENLAFAXINE HCL ER 75 MG PO CP24: 75.0000 mg | ORAL_CAPSULE | Freq: Every day | ORAL | Status: DC

## 2015-10-27 ENCOUNTER — Other Ambulatory Visit: Payer: Self-pay | Admitting: Family Medicine

## 2015-12-01 ENCOUNTER — Ambulatory Visit (INDEPENDENT_AMBULATORY_CARE_PROVIDER_SITE_OTHER): Payer: 59 | Admitting: Licensed Clinical Social Worker

## 2015-12-01 DIAGNOSIS — F331 Major depressive disorder, recurrent, moderate: Secondary | ICD-10-CM

## 2015-12-05 ENCOUNTER — Ambulatory Visit (INDEPENDENT_AMBULATORY_CARE_PROVIDER_SITE_OTHER): Payer: 59 | Admitting: Family Medicine

## 2015-12-05 ENCOUNTER — Encounter: Payer: Self-pay | Admitting: Family Medicine

## 2015-12-05 VITALS — BP 118/82 | HR 81 | Temp 98.6°F | Ht 65.0 in | Wt 211.4 lb

## 2015-12-05 DIAGNOSIS — E119 Type 2 diabetes mellitus without complications: Secondary | ICD-10-CM | POA: Diagnosis not present

## 2015-12-05 DIAGNOSIS — M25511 Pain in right shoulder: Secondary | ICD-10-CM

## 2015-12-05 DIAGNOSIS — E039 Hypothyroidism, unspecified: Secondary | ICD-10-CM

## 2015-12-05 DIAGNOSIS — K219 Gastro-esophageal reflux disease without esophagitis: Secondary | ICD-10-CM

## 2015-12-05 DIAGNOSIS — E1169 Type 2 diabetes mellitus with other specified complication: Secondary | ICD-10-CM

## 2015-12-05 DIAGNOSIS — M542 Cervicalgia: Secondary | ICD-10-CM

## 2015-12-05 DIAGNOSIS — E782 Mixed hyperlipidemia: Secondary | ICD-10-CM

## 2015-12-05 DIAGNOSIS — E669 Obesity, unspecified: Secondary | ICD-10-CM

## 2015-12-05 DIAGNOSIS — Z0000 Encounter for general adult medical examination without abnormal findings: Secondary | ICD-10-CM

## 2015-12-05 DIAGNOSIS — R002 Palpitations: Secondary | ICD-10-CM

## 2015-12-05 MED ORDER — TRAMADOL HCL 50 MG PO TABS: 50.0000 mg | ORAL_TABLET | Freq: Three times a day (TID) | ORAL | Status: DC | PRN

## 2015-12-05 NOTE — Patient Instructions (Signed)

## 2015-12-05 NOTE — Progress Notes (Signed)
Pre visit review using our clinic review tool, if applicable. No additional management support is needed unless otherwise documented below in the visit note. 

## 2015-12-11 NOTE — Assessment & Plan Note (Signed)
Avoid offending foods, start probiotics. Do not eat large meals in late evening and consider raising head of bed.  

## 2015-12-11 NOTE — Assessment & Plan Note (Signed)
Encouraged DASH diet, decrease po intake and increase exercise as tolerated. Needs 7-8 hours of sleep nightly. Avoid trans fats, eat small, frequent meals every 4-5 hours with lean proteins, complex carbs and healthy fats. Minimize simple carbs 

## 2015-12-11 NOTE — Assessment & Plan Note (Signed)
On Levothyroxine, continue to monitor 

## 2015-12-11 NOTE — Progress Notes (Signed)
Patient ID: Ebony [name], female   DOB: 12/20/1965, 50 y.o.   MRN: [id number]   Subjective:    Patient ID: Ebony [name], female    DOB: June 06, 1965, 50 y.o.   MRN: [id number]  Chief Complaint  Patient presents with  . Follow-up    HPI Patient is in today for follow up. No recent illness. No recent hospitalization or acute concerns. Has had infrequent headaches which have responded to Tramadol prn. Denies CP/palp/SOB/HA/congestion/fevers/GI or GU c/o. Taking meds as prescribed  Past Medical History  Diagnosis Date  . GERD (gastroesophageal reflux disease)   . Hypertension     only with preg. 80yrs ago 2002- no meds  . Headache(784.0)     otc meds prn, migraine  . Chicken pox as a child  . Preventative health care 06/13/2012  . BCC (basal cell carcinoma), face     skin   . Hypothyroid 06/15/2012  . Depression with anxiety 10/06/2012  . Hyperlipidemia   . SVT (supraventricular tachycardia) ([place])   . Concussion with no loss of consciousness 12/28/2014  . Tinea pedis 12/28/2014  . Hyperlipidemia, mixed 12/28/2014  . Diabetes mellitus without complication ([place])   . Obesity 09/05/2015  . Pain of left calf 10/01/2014  . GERD (gastroesophageal reflux disease) 09/05/2015    Past Surgical History  Procedure Laterality Date  . Tonsillectomy    . Cesarean section  2002  . Laparoscopic tubal ligation  01/03/2011    Procedure: LAPAROSCOPIC TUBAL LIGATION;  Surgeon: Marylynn Pearson;  Location: [facility];  Service: Gynecology;  Laterality: Bilateral;  with filshie clips  . Abdominal hysterectomy  03/04/2012    Procedure: HYSTERECTOMY ABDOMINAL;  Surgeon: Marylynn Pearson, MD;  Location: [facility];  Service: Gynecology;  Laterality: N/A;  with lysis of adhesions  . Laparoscopy  03/04/2012    Procedure: LAPAROSCOPY DIAGNOSTIC;  Surgeon: Marylynn Pearson, MD;  Location: [facility];  Service: Gynecology;  Laterality: N/A;  with lysis of adhesions    Family History  Problem Relation Age of Onset  . Cancer  Mother     lung (06-13-11) breast -10 years ago  . Hypertension Mother   . Depression Mother   . Cancer Brother     skin  . Other Maternal Grandmother     spinal stenosis  . Cancer Maternal Grandfather     prostate  . Emphysema Maternal Grandfather     Social History   Social History  . Marital Status: Married    Spouse Name: N/A  . Number of Children: 1  . Years of Education: N/A   Occupational History  .      House wife   Social History Main Topics  . Smoking status: Never Smoker   . Smokeless tobacco: Never Used  . Alcohol Use: Yes     Comment: occasionally   . Drug Use: No  . Sexual Activity: Yes    Birth Control/ Protection: Surgical   Other Topics Concern  . Not on file   Social History Narrative    Outpatient Prescriptions Prior to Visit  Medication Sig Dispense Refill  . diltiazem (CARDIZEM CD) 120 MG 24 hr capsule TAKE 1 CAPSULE (120 MG TOTAL) BY MOUTH DAILY. 90 capsule 3  . glucose blood (ONETOUCH VERIO) test strip Test once daily to check blood sugar.  DX 11.9 50 each 6  . levothyroxine (SYNTHROID, LEVOTHROID) 75 MCG tablet TAKE 1 TABLET (75 MCG TOTAL) BY MOUTH DAILY BEFORE BREAKFAST. 90 tablet 0  . ONETOUCH DELICA LANCETS 99991111  MISC Test once daily to check blood sugar.  DX 11.9 50 each 6  . ranitidine (ZANTAC) 150 MG tablet Take 150 mg by mouth at bedtime.      Marland Kitchen venlafaxine XR (EFFEXOR-XR) 75 MG 24 hr capsule Take 1 capsule (75 mg total) by mouth daily with breakfast. 30 capsule 6  . traMADol (ULTRAM) 50 MG tablet Take 1 tablet (50 mg total) by mouth every 8 (eight) hours as needed for pain. 40 tablet 1   No facility-administered medications prior to visit.    No Known Allergies  Review of Systems  Constitutional: Negative for fever and malaise/fatigue.  HENT: Negative for congestion.   Eyes: Negative for blurred vision.  Respiratory: Negative for shortness of breath.   Cardiovascular: Negative for chest pain, palpitations and leg swelling.    Gastrointestinal: Negative for nausea, abdominal pain and blood in stool.  Genitourinary: Negative for dysuria and frequency.  Musculoskeletal: Negative for falls.  Skin: Negative for rash.  Neurological: Negative for dizziness and loss of consciousness.  Endo/Heme/Allergies: Negative for environmental allergies.  Psychiatric/Behavioral: Negative for depression. The patient is not nervous/anxious.        Objective:    Physical Exam  Constitutional: She is oriented to person, place, and time. She appears well-developed and well-nourished. No distress.  HENT:  Head: Normocephalic and atraumatic.  Nose: Nose normal.  Eyes: Right eye exhibits no discharge. Left eye exhibits no discharge.  Neck: Normal range of motion. Neck supple.  Cardiovascular: Normal rate and regular rhythm.   No murmur heard. Pulmonary/Chest: Effort normal and breath sounds normal.  Abdominal: Soft. Bowel sounds are normal. There is no tenderness.  Musculoskeletal: She exhibits no edema.  Neurological: She is alert and oriented to person, place, and time.  Skin: Skin is warm and dry.  Psychiatric: She has a normal mood and affect.  Vitals reviewed.   BP 118/82 mmHg  Pulse 81  Temp(Src) 98.6 F (37 C) (Oral)  Ht 5\' 5"  (1.651 m)  Wt 211 lb 6 oz (95.879 kg)  BMI 35.17 kg/m2  SpO2 96%  LMP 12/15/2010 Wt Readings from Last 3 Encounters:  12/05/15 211 lb 6 oz (95.879 kg)  09/05/15 222 lb 8 oz (100.925 kg)  07/08/15 237 lb (107.502 kg)     Lab Results  Component Value Date   WBC 10.2 08/29/2015   HGB 14.4 08/29/2015   HCT 43.1 08/29/2015   PLT 307.0 08/29/2015   GLUCOSE 99 08/29/2015   CHOL 180 08/29/2015   TRIG 125.0 08/29/2015   HDL 48.90 08/29/2015   LDLCALC 107* 08/29/2015   ALT 18 08/29/2015   AST 19 08/29/2015   NA 137 08/29/2015   K 3.9 08/29/2015   CL 104 08/29/2015   CREATININE 0.77 08/29/2015   BUN 16 08/29/2015   CO2 25 08/29/2015   TSH 1.30 08/29/2015   HGBA1C 6.1 08/29/2015    MICROALBUR 1.6 09/05/2015    Lab Results  Component Value Date   TSH 1.30 08/29/2015   Lab Results  Component Value Date   WBC 10.2 08/29/2015   HGB 14.4 08/29/2015   HCT 43.1 08/29/2015   MCV 79.7 08/29/2015   PLT 307.0 08/29/2015   Lab Results  Component Value Date   NA 137 08/29/2015   K 3.9 08/29/2015   CO2 25 08/29/2015   GLUCOSE 99 08/29/2015   BUN 16 08/29/2015   CREATININE 0.77 08/29/2015   BILITOT 0.5 08/29/2015   ALKPHOS 63 08/29/2015   AST 19 08/29/2015  ALT 18 08/29/2015   PROT 7.6 08/29/2015   ALBUMIN 4.3 08/29/2015   CALCIUM 9.9 08/29/2015   GFR 85.19 08/29/2015   Lab Results  Component Value Date   CHOL 180 08/29/2015   Lab Results  Component Value Date   HDL 48.90 08/29/2015   Lab Results  Component Value Date   LDLCALC 107* 08/29/2015   Lab Results  Component Value Date   TRIG 125.0 08/29/2015   Lab Results  Component Value Date   CHOLHDL 4 08/29/2015   Lab Results  Component Value Date   HGBA1C 6.1 08/29/2015       Assessment & Plan:   Problem List Items Addressed This Visit    Preventative health care   Relevant Orders   TSH   CBC   Hemoglobin A1c   Comprehensive metabolic panel   Lipid panel   Microalbumin / creatinine urine ratio   Hypothyroid    On Levothyroxine, continue to monitor      Relevant Orders   TSH   CBC   Hemoglobin A1c   Comprehensive metabolic panel   Lipid panel   Microalbumin / creatinine urine ratio   Right shoulder pain - Primary   Relevant Medications   traMADol (ULTRAM) 50 MG tablet   Other Relevant Orders   TSH   CBC   Hemoglobin A1c   Comprehensive metabolic panel   Lipid panel   Microalbumin / creatinine urine ratio   Neck pain   Relevant Medications   traMADol (ULTRAM) 50 MG tablet   Other Relevant Orders   TSH   CBC   Hemoglobin A1c   Comprehensive metabolic panel   Lipid panel   Microalbumin / creatinine urine ratio   Palpitations   Relevant Orders   TSH   CBC    Hemoglobin A1c   Comprehensive metabolic panel   Lipid panel   Microalbumin / creatinine urine ratio   Hyperlipidemia, mixed   Relevant Orders   TSH   CBC   Hemoglobin A1c   Comprehensive metabolic panel   Lipid panel   Microalbumin / creatinine urine ratio   Type 2 diabetes mellitus without complication, without long-term current use of insulin (HCC)   Relevant Orders   TSH   CBC   Hemoglobin A1c   Comprehensive metabolic panel   Lipid panel   Microalbumin / creatinine urine ratio   Obesity    Encouraged DASH diet, decrease po intake and increase exercise as tolerated. Needs 7-8 hours of sleep nightly. Avoid trans fats, eat small, frequent meals every 4-5 hours with lean proteins, complex carbs and healthy fats. Minimize simple carbs      GERD (gastroesophageal reflux disease)    Avoid offending foods, start probiotics. Do not eat large meals in late evening and consider raising head of bed.       Relevant Orders   TSH   CBC   Hemoglobin A1c   Comprehensive metabolic panel   Lipid panel   Microalbumin / creatinine urine ratio    Other Visit Diagnoses    Diabetes mellitus type 2 in obese (HCC)        Relevant Orders    Hemoglobin A1c    TSH    CBC    Hemoglobin A1c    Comprehensive metabolic panel    Lipid panel    Microalbumin / creatinine urine ratio       I have changed Ms. Arvanitis's traMADol. I am also having her maintain her ranitidine, diltiazem, glucose blood,  ONETOUCH DELICA LANCETS 99991111, venlafaxine XR, and levothyroxine.  Meds ordered this encounter  Medications  . traMADol (ULTRAM) 50 MG tablet    Sig: Take 1 tablet (50 mg total) by mouth every 8 (eight) hours as needed.    Dispense:  60 tablet    Refill:  0     Penni Homans, MD

## 2016-02-06 ENCOUNTER — Other Ambulatory Visit: Payer: Self-pay | Admitting: Cardiology

## 2016-02-07 NOTE — Telephone Encounter (Signed)
Rx(s) sent to pharmacy electronically.  

## 2016-02-09 ENCOUNTER — Encounter: Payer: Self-pay | Admitting: Cardiology

## 2016-02-09 ENCOUNTER — Ambulatory Visit (INDEPENDENT_AMBULATORY_CARE_PROVIDER_SITE_OTHER): Payer: 59 | Admitting: Cardiology

## 2016-02-09 VITALS — BP 136/94 | HR 68 | Ht 65.0 in | Wt 211.0 lb

## 2016-02-09 DIAGNOSIS — I471 Supraventricular tachycardia: Secondary | ICD-10-CM | POA: Diagnosis not present

## 2016-02-09 MED ORDER — DILTIAZEM HCL ER COATED BEADS 240 MG PO CP24: 240.0000 mg | ORAL_CAPSULE | Freq: Every day | ORAL | 3 refills | Status: DC

## 2016-02-09 NOTE — Progress Notes (Signed)
HPI: FU SVT. She also apparently has been told she had a bicuspid aortic valve in the past. Echo in August of 2014 showed normal LV function, grade 2 diastolic dysfunction; mild LAE. Monitor in Oct of 2014 showed intermittent SVT. Toprol added but developed fatigue; changed to cardizem. Since last seen, She has occasional bouts of SVTTypically lasting minutes but rarely lasting 2 hours. She otherwise denies dyspnea, chest pain or syncope.  Current Outpatient Prescriptions  Medication Sig Dispense Refill  . diltiazem (CARDIZEM CD) 120 MG 24 hr capsule TAKE 1 CAPSULE (120 MG TOTAL) BY MOUTH DAILY. 90 capsule 2  . glucose blood (ONETOUCH VERIO) test strip Test once daily to check blood sugar.  DX 11.9 50 each 6  . levothyroxine (SYNTHROID, LEVOTHROID) 75 MCG tablet TAKE 1 TABLET (75 MCG TOTAL) BY MOUTH DAILY BEFORE BREAKFAST. 90 tablet 0  . ONETOUCH DELICA LANCETS 99991111 MISC Test once daily to check blood sugar.  DX 11.9 50 each 6  . ranitidine (ZANTAC) 150 MG tablet Take 150 mg by mouth at bedtime.      . traMADol (ULTRAM) 50 MG tablet Take 1 tablet (50 mg total) by mouth every 8 (eight) hours as needed. 60 tablet 0  . venlafaxine XR (EFFEXOR-XR) 75 MG 24 hr capsule Take 1 capsule (75 mg total) by mouth daily with breakfast. 30 capsule 6   No current facility-administered medications for this visit.      Past Medical History:  Diagnosis Date  . BCC (basal cell carcinoma), face    skin   . Chicken pox as a child  . Concussion with no loss of consciousness 12/28/2014  . Depression with anxiety 10/06/2012  . Diabetes mellitus without complication ([place])   . GERD (gastroesophageal reflux disease)   . GERD (gastroesophageal reflux disease) 09/05/2015  . Headache(784.0)    otc meds prn, migraine  . Hyperlipidemia   . Hyperlipidemia, mixed 12/28/2014  . Hypertension    only with preg. 9yrs ago 2002- no meds  . Hypothyroid 06/15/2012  . Obesity 09/05/2015  . Pain of left calf 10/01/2014  .  Preventative health care 06/13/2012  . SVT (supraventricular tachycardia) ([place])   . Tinea pedis 12/28/2014    Past Surgical History:  Procedure Laterality Date  . ABDOMINAL HYSTERECTOMY  03/04/2012   Procedure: HYSTERECTOMY ABDOMINAL;  Surgeon: Marylynn Pearson, MD;  Location: [facility];  Service: Gynecology;  Laterality: N/A;  with lysis of adhesions  . CESAREAN SECTION  2002  . LAPAROSCOPIC TUBAL LIGATION  01/03/2011   Procedure: LAPAROSCOPIC TUBAL LIGATION;  Surgeon: Marylynn Pearson;  Location: [facility];  Service: Gynecology;  Laterality: Bilateral;  with filshie clips  . LAPAROSCOPY  03/04/2012   Procedure: LAPAROSCOPY DIAGNOSTIC;  Surgeon: Marylynn Pearson, MD;  Location: [facility];  Service: Gynecology;  Laterality: N/A;  with lysis of adhesions  . TONSILLECTOMY      Social History   Social History  . Marital status: Married    Spouse name: N/A  . Number of children: 1  . Years of education: N/A   Occupational History  .      House wife   Social History Main Topics  . Smoking status: Never Smoker  . Smokeless tobacco: Never Used  . Alcohol use Yes     Comment: occasionally   . Drug use: No  . Sexual activity: Yes    Birth control/ protection: Surgical   Other Topics Concern  . Not on file   Social History Narrative  .  No narrative on file    Family History  Problem Relation Age of Onset  . Cancer Mother     lung (06-13-11) breast -10 years ago  . Hypertension Mother   . Depression Mother   . Cancer Brother     skin  . Other Maternal Grandmother     spinal stenosis  . Cancer Maternal Grandfather     prostate  . Emphysema Maternal Grandfather     ROS: no fevers or chills, productive cough, hemoptysis, dysphasia, odynophagia, melena, hematochezia, dysuria, hematuria, rash, seizure activity, orthopnea, PND, pedal edema, claudication. Remaining systems are negative.  Physical Exam: Well-developed well-nourished in no acute distress.  Skin is warm and dry.  HEENT is  normal.  Neck is supple.  Chest is clear to auscultation with normal expansion.  Cardiovascular exam is regular rate and rhythm.  Abdominal exam nontender or distended. No masses palpated. Extremities show no edema. neuro grossly intact  ECG Sinus rhythm at a rate of 68. No ST changes.  A/P  1 SVT-patient continues to have bouts of SVT.She does not find these particularly bothersome. I will increase her Cardizem to 240 mg daily. If they become more symptomatic and longer in the future she would consider an ablation but she is not ready to pursue that at this point.  Kirk Ruths, MD

## 2016-02-09 NOTE — Patient Instructions (Signed)
Medication Instructions:  Your physician has recommended you make the following change in your medication:  1- INCREASE Diltiazem to 240mg  by mouth once a day.   Follow-Up: Your physician wants you to follow-up in: [place]. You will receive a reminder letter in the mail two months in advance. If you don't receive a letter, please call our office to schedule the follow-up appointment.   If you need a refill on your cardiac medications before your next appointment, please call your pharmacy.

## 2016-03-08 ENCOUNTER — Other Ambulatory Visit: Payer: Self-pay | Admitting: Family Medicine

## 2016-03-22 ENCOUNTER — Other Ambulatory Visit (INDEPENDENT_AMBULATORY_CARE_PROVIDER_SITE_OTHER): Payer: 59

## 2016-03-22 DIAGNOSIS — E782 Mixed hyperlipidemia: Secondary | ICD-10-CM

## 2016-03-22 DIAGNOSIS — E039 Hypothyroidism, unspecified: Secondary | ICD-10-CM

## 2016-03-22 DIAGNOSIS — E1169 Type 2 diabetes mellitus with other specified complication: Secondary | ICD-10-CM | POA: Diagnosis not present

## 2016-03-22 DIAGNOSIS — M25511 Pain in right shoulder: Secondary | ICD-10-CM

## 2016-03-22 DIAGNOSIS — R002 Palpitations: Secondary | ICD-10-CM

## 2016-03-22 DIAGNOSIS — E669 Obesity, unspecified: Secondary | ICD-10-CM | POA: Diagnosis not present

## 2016-03-22 DIAGNOSIS — Z0000 Encounter for general adult medical examination without abnormal findings: Secondary | ICD-10-CM

## 2016-03-22 DIAGNOSIS — K219 Gastro-esophageal reflux disease without esophagitis: Secondary | ICD-10-CM

## 2016-03-22 DIAGNOSIS — E119 Type 2 diabetes mellitus without complications: Secondary | ICD-10-CM

## 2016-03-22 DIAGNOSIS — M542 Cervicalgia: Secondary | ICD-10-CM

## 2016-03-22 LAB — COMPREHENSIVE METABOLIC PANEL
ALBUMIN: 4.7 g/dL (ref 3.5–5.2)
ALK PHOS: 69 U/L (ref 39–117)
ALT: 22 U/L (ref 0–35)
AST: 23 U/L (ref 0–37)
BILIRUBIN TOTAL: 0.5 mg/dL (ref 0.2–1.2)
BUN: 18 mg/dL (ref 6–23)
CO2: 27 mEq/L (ref 19–32)
Calcium: 9.9 mg/dL (ref 8.4–10.5)
Chloride: 105 mEq/L (ref 96–112)
Creatinine, Ser: 0.86 mg/dL (ref 0.40–1.20)
GFR: 74.81 mL/min (ref >60.00)
GLUCOSE: 149 mg/dL — AB (ref 70–99)
POTASSIUM: 3.7 meq/L (ref 3.5–5.1)
SODIUM: 140 meq/L (ref 135–145)
TOTAL PROTEIN: 7.8 g/dL (ref 6.0–8.3)

## 2016-03-22 LAB — CBC
HEMATOCRIT: 42.1 % (ref 36.0–46.0)
Hemoglobin: 14.4 g/dL (ref 12.0–15.0)
MCHC: 34.2 g/dL (ref 30.0–36.0)
MCV: 81.9 fl (ref 78.0–100.0)
PLATELETS: 301 10*3/uL (ref 150.0–400.0)
RBC: 5.13 Mil/uL — AB (ref 3.87–5.11)
RDW: 12.9 % (ref 11.5–15.5)
WBC: 8.3 10*3/uL (ref 4.0–10.5)

## 2016-03-22 LAB — LIPID PANEL
CHOL/HDL RATIO: 4
CHOLESTEROL: 186 mg/dL (ref 0–200)
HDL: 50.8 mg/dL (ref >39.00)
LDL CALC: 120 mg/dL — AB (ref 0–99)
NonHDL: 135.1
TRIGLYCERIDES: 75 mg/dL (ref 0.0–149.0)
VLDL: 15 mg/dL (ref 0.0–40.0)

## 2016-03-22 LAB — TSH: TSH: 2.64 u[IU]/mL (ref 0.35–4.50)

## 2016-03-22 LAB — MICROALBUMIN / CREATININE URINE RATIO
Creatinine,U: 148.1 mg/dL
Microalb Creat Ratio: 1.2 mg/g (ref 0.0–30.0)
Microalb, Ur: 1.8 mg/dL (ref 0.0–1.9)

## 2016-03-22 LAB — HEMOGLOBIN A1C: HEMOGLOBIN A1C: 5.9 % (ref 4.6–6.5)

## 2016-05-27 ENCOUNTER — Other Ambulatory Visit: Payer: Self-pay | Admitting: Family Medicine

## 2016-05-30 ENCOUNTER — Other Ambulatory Visit: Payer: Self-pay | Admitting: Obstetrics and Gynecology

## 2016-05-30 DIAGNOSIS — R928 Other abnormal and inconclusive findings on diagnostic imaging of breast: Secondary | ICD-10-CM

## 2016-06-04 ENCOUNTER — Other Ambulatory Visit: Payer: Self-pay | Admitting: Family Medicine

## 2016-06-05 ENCOUNTER — Ambulatory Visit
Admission: RE | Admit: 2016-06-05 | Discharge: 2016-06-05 | Disposition: A | Discharge status: Home / Self Care | Payer: 59 | Source: Ambulatory Visit | Attending: Obstetrics and Gynecology | Admitting: Obstetrics and Gynecology

## 2016-06-05 DIAGNOSIS — R928 Other abnormal and inconclusive findings on diagnostic imaging of breast: Secondary | ICD-10-CM

## 2016-06-21 ENCOUNTER — Other Ambulatory Visit: Payer: Self-pay | Admitting: Family Medicine

## 2016-06-25 ENCOUNTER — Other Ambulatory Visit (INDEPENDENT_AMBULATORY_CARE_PROVIDER_SITE_OTHER): Payer: 59

## 2016-06-25 DIAGNOSIS — Z0000 Encounter for general adult medical examination without abnormal findings: Secondary | ICD-10-CM | POA: Diagnosis not present

## 2016-06-25 LAB — COMPREHENSIVE METABOLIC PANEL
ALBUMIN: 4.3 g/dL (ref 3.5–5.2)
ALK PHOS: 66 U/L (ref 39–117)
ALT: 19 U/L (ref 0–35)
AST: 20 U/L (ref 0–37)
BUN: 17 mg/dL (ref 6–23)
CO2: 27 mEq/L (ref 19–32)
Calcium: 9.8 mg/dL (ref 8.4–10.5)
Chloride: 102 mEq/L (ref 96–112)
Creatinine, Ser: 0.78 mg/dL (ref 0.40–1.20)
GFR: 83.64 mL/min (ref >60.00)
Glucose, Bld: 85 mg/dL (ref 70–99)
POTASSIUM: 3.9 meq/L (ref 3.5–5.1)
Sodium: 137 mEq/L (ref 135–145)
TOTAL PROTEIN: 7.5 g/dL (ref 6.0–8.3)
Total Bilirubin: 0.4 mg/dL (ref 0.2–1.2)

## 2016-06-25 LAB — LIPID PANEL
Cholesterol: 179 mg/dL (ref 0–200)
HDL: 55.3 mg/dL (ref >39.00)
LDL Cholesterol: 100 mg/dL — ABNORMAL HIGH (ref 0–99)
NonHDL: 123.83
Total CHOL/HDL Ratio: 3
Triglycerides: 121 mg/dL (ref 0.0–149.0)
VLDL: 24.2 mg/dL (ref 0.0–40.0)

## 2016-06-25 LAB — CBC
HCT: 41.2 % (ref 36.0–46.0)
HEMOGLOBIN: 14 g/dL (ref 12.0–15.0)
MCHC: 34 g/dL (ref 30.0–36.0)
MCV: 82.8 fl (ref 78.0–100.0)
PLATELETS: 302 10*3/uL (ref 150.0–400.0)
RBC: 4.97 Mil/uL (ref 3.87–5.11)
RDW: 13.1 % (ref 11.5–15.5)
WBC: 10 10*3/uL (ref 4.0–10.5)

## 2016-06-25 LAB — TSH: TSH: 1.75 u[IU]/mL (ref 0.35–4.50)

## 2016-06-25 LAB — HEMOGLOBIN A1C: Hgb A1c MFr Bld: 5.9 % (ref 4.6–6.5)

## 2016-06-28 ENCOUNTER — Encounter: Payer: Self-pay | Admitting: Family Medicine

## 2016-06-28 ENCOUNTER — Ambulatory Visit (INDEPENDENT_AMBULATORY_CARE_PROVIDER_SITE_OTHER): Payer: 59 | Admitting: Family Medicine

## 2016-06-28 DIAGNOSIS — E782 Mixed hyperlipidemia: Secondary | ICD-10-CM

## 2016-06-28 DIAGNOSIS — K219 Gastro-esophageal reflux disease without esophagitis: Secondary | ICD-10-CM

## 2016-06-28 DIAGNOSIS — E119 Type 2 diabetes mellitus without complications: Secondary | ICD-10-CM

## 2016-06-28 DIAGNOSIS — E6609 Other obesity due to excess calories: Secondary | ICD-10-CM

## 2016-06-28 DIAGNOSIS — E039 Hypothyroidism, unspecified: Secondary | ICD-10-CM | POA: Diagnosis not present

## 2016-06-28 DIAGNOSIS — R51 Headache: Secondary | ICD-10-CM

## 2016-06-28 DIAGNOSIS — R519 Headache, unspecified: Secondary | ICD-10-CM

## 2016-06-28 DIAGNOSIS — F418 Other specified anxiety disorders: Secondary | ICD-10-CM

## 2016-06-28 DIAGNOSIS — Z0000 Encounter for general adult medical examination without abnormal findings: Secondary | ICD-10-CM

## 2016-06-28 NOTE — Assessment & Plan Note (Addendum)
Patient encouraged to maintain heart healthy diet, regular exercise, adequate sleep. Consider daily probiotics. Take medications as prescribed. Given and reviewed copy of ACP documents from [place] Secretary of State and encouraged to complete and return 

## 2016-06-28 NOTE — Assessment & Plan Note (Signed)
On Levothyroxine, continue to monitor 

## 2016-06-28 NOTE — Assessment & Plan Note (Addendum)
Encouraged DASH diet, decrease po intake and increase exercise as tolerated. Needs 7-8 hours of sleep nightly. Avoid trans fats, eat small, frequent meals every 4-5 hours with lean proteins, complex carbs and healthy fats. Minimize simple carbs. Consider bariatric referral, she declines for now because she has lost nearly 30# since last January.

## 2016-06-28 NOTE — Patient Instructions (Signed)
NOW company at [email or web address] NOW probiotic, 1 cap daily, at [place] Years, Female Preventive care refers to lifestyle choices and visits with your health care provider that can promote health and wellness. What does preventive care include?  A yearly physical exam. This is also called an annual well check.  Dental exams once or twice a year.  Routine eye exams. Ask your health care provider how often you should have your eyes checked.  Personal lifestyle choices, including:  Daily care of your teeth and gums.  Regular physical activity.  Eating a healthy diet.  Avoiding tobacco and drug use.  Limiting alcohol use.  Practicing safe sex.  Taking low-dose aspirin daily starting at age 32.  Taking vitamin and mineral supplements as recommended by your health care provider. What happens during an annual well check? The services and screenings done by your health care provider during your annual well check will depend on your age, overall health, lifestyle risk factors, and family history of disease. Counseling  Your health care provider may ask you questions about your:  Alcohol use.  Tobacco use.  Drug use.  Emotional well-being.  Home and relationship well-being.  Sexual activity.  Eating habits.  Work and work Statistician.  Method of birth control.  Menstrual cycle.  Pregnancy history. Screening  You may have the following tests or measurements:  Height, weight, and BMI.  Blood pressure.  Lipid and cholesterol levels. These may be checked every 5 years, or more frequently if you are over 34 years old.  Skin check.  Lung cancer screening. You may have this screening every year starting at age 29 if you have a 30-pack-year history of smoking and currently smoke or have quit within the past 15 years.  Fecal occult blood test (FOBT) of the stool. You may have this test every year starting at age  62.  Flexible sigmoidoscopy or colonoscopy. You may have a sigmoidoscopy every 5 years or a colonoscopy every 10 years starting at age 73.  Hepatitis C blood test.  Hepatitis B blood test.  Sexually transmitted disease (STD) testing.  Diabetes screening. This is done by checking your blood sugar (glucose) after you have not eaten for a while (fasting). You may have this done every 1-3 years.  Mammogram. This may be done every 1-2 years. Talk to your health care provider about when you should start having regular mammograms. This may depend on whether you have a family history of breast cancer.  BRCA-related cancer screening. This may be done if you have a family history of breast, ovarian, tubal, or peritoneal cancers.  Pelvic exam and Pap test. This may be done every 3 years starting at age 39. Starting at age 57, this may be done every 5 years if you have a Pap test in combination with an HPV test.  Bone density scan. This is done to screen for osteoporosis. You may have this scan if you are at high risk for osteoporosis. Discuss your test results, treatment options, and if necessary, the need for more tests with your health care provider. Vaccines  Your health care provider may recommend certain vaccines, such as:  Influenza vaccine. This is recommended every year.  Tetanus, diphtheria, and acellular pertussis (Tdap, Td) vaccine. You may need a Td booster every 10 years.  Varicella vaccine. You may need this if you have not been vaccinated.  Zoster vaccine. You may need this after age 70.  Measles, mumps, and rubella (  MMR) vaccine. You may need at least one dose of MMR if you were born in 1957 or later. You may also need a second dose.  Pneumococcal 13-valent conjugate (PCV13) vaccine. You may need this if you have certain conditions and were not previously vaccinated.  Pneumococcal polysaccharide (PPSV23) vaccine. You may need one or two doses if you smoke cigarettes or if you  have certain conditions.  Meningococcal vaccine. You may need this if you have certain conditions.  Hepatitis A vaccine. You may need this if you have certain conditions or if you travel or work in places where you may be exposed to hepatitis A.  Hepatitis B vaccine. You may need this if you have certain conditions or if you travel or work in places where you may be exposed to hepatitis B.  Haemophilus influenzae type b (Hib) vaccine. You may need this if you have certain conditions. Talk to your health care provider about which screenings and vaccines you need and how often you need them. This information is not intended to replace advice given to you by your health care provider. Make sure you discuss any questions you have with your health care provider. Document Released: 06/17/2015 Document Revised: 02/08/2016 Document Reviewed: 03/22/2015 Elsevier Interactive Patient Education  2017 [employer].

## 2016-06-28 NOTE — Assessment & Plan Note (Signed)
Encouraged heart healthy diet, increase exercise, avoid trans fats, consider a krill oil cap daily 

## 2016-06-28 NOTE — Assessment & Plan Note (Signed)
hgba1c acceptable, minimize simple carbs. Increase exercise as tolerated. Continue current meds 

## 2016-06-28 NOTE — Assessment & Plan Note (Signed)
migraines better but gets tension headaches daily.

## 2016-06-28 NOTE — Progress Notes (Signed)
Subjective:    Patient ID: Ebony [name], female    DOB: 12-02-1965, 51 y.o.   MRN: [id number]  Chief Complaint  Patient presents with  . Annual Exam   I acted as a Education administrator for Dr. Charlett Blake. Princess, RMA  HPI Patient is in today for annual exam and patient complains of more frequent than normal. She has been dieting and trying to stay active and is pleased with her weight loss. She feels much better, more energetic and less achy. She is feeling well and believes the Venlafaxine has been helpful this year and her mood is improved. She reports she is doing as well as she has in several years. No recent hospitalizatoins or acute concerns today. Denies CP/palp/SOB/HA/congestion/fevers/GI or GU c/o. Taking meds as prescribed   Past Medical History:  Diagnosis Date  . BCC (basal cell carcinoma), face    skin   . Chicken pox as a child  . Concussion with no loss of consciousness 12/28/2014  . Depression with anxiety 10/06/2012  . Diabetes mellitus without complication ([place])   . GERD (gastroesophageal reflux disease)   . GERD (gastroesophageal reflux disease) 09/05/2015  . Headache(784.0)    otc meds prn, migraine  . Hyperlipidemia   . Hyperlipidemia, mixed 12/28/2014  . Hypertension    only with preg. 36yrs ago 2002- no meds  . Hypothyroid 06/15/2012  . Obesity 09/05/2015  . Pain of left calf 10/01/2014  . Preventative health care 06/13/2012  . SVT (supraventricular tachycardia) ([place])   . Tinea pedis 12/28/2014    Past Surgical History:  Procedure Laterality Date  . ABDOMINAL HYSTERECTOMY  03/04/2012   Procedure: HYSTERECTOMY ABDOMINAL;  Surgeon: Marylynn Pearson, MD;  Location: [facility];  Service: Gynecology;  Laterality: N/A;  with lysis of adhesions  . CESAREAN SECTION  2002  . LAPAROSCOPIC TUBAL LIGATION  01/03/2011   Procedure: LAPAROSCOPIC TUBAL LIGATION;  Surgeon: Marylynn Pearson;  Location: [facility];  Service: Gynecology;  Laterality: Bilateral;  with filshie clips  . LAPAROSCOPY   03/04/2012   Procedure: LAPAROSCOPY DIAGNOSTIC;  Surgeon: Marylynn Pearson, MD;  Location: [facility];  Service: Gynecology;  Laterality: N/A;  with lysis of adhesions  . TONSILLECTOMY      Family History  Problem Relation Age of Onset  . Cancer Mother     lung (06-13-11) breast -10 years ago  . Hypertension Mother   . Depression Mother   . Cancer Brother     skin  . Other Maternal Grandmother     spinal stenosis  . Cancer Maternal Grandfather     prostate  . Emphysema Maternal Grandfather     Social History   Social History  . Marital status: Married    Spouse name: N/A  . Number of children: 1  . Years of education: N/A   Occupational History  .      House wife   Social History Main Topics  . Smoking status: Never Smoker  . Smokeless tobacco: Never Used  . Alcohol use Yes     Comment: occasionally   . Drug use: No  . Sexual activity: Yes    Birth control/ protection: Surgical   Other Topics Concern  . Not on file   Social History Narrative  . No narrative on file    Outpatient Medications Prior to Visit  Medication Sig Dispense Refill  . glucose blood (ONETOUCH VERIO) test strip Test once daily to check blood sugar.  DX 11.9 50 each 6  . levothyroxine (SYNTHROID,  LEVOTHROID) 75 MCG tablet TAKE 1 TABLET BY MOUTH DAILY BEFORE BREAKFAST. 90 tablet 0  . ONETOUCH DELICA LANCETS 99991111 MISC Test once daily to check blood sugar.  DX 11.9 50 each 6  . ranitidine (ZANTAC) 150 MG tablet Take 150 mg by mouth at bedtime.      . traMADol (ULTRAM) 50 MG tablet Take 1 tablet (50 mg total) by mouth every 8 (eight) hours as needed. 60 tablet 0  . venlafaxine XR (EFFEXOR-XR) 75 MG 24 hr capsule TAKE 1 CAPSULE (75 MG TOTAL) BY MOUTH DAILY WITH BREAKFAST. 30 capsule 6  . diltiazem (CARDIZEM CD) 240 MG 24 hr capsule Take 1 capsule (240 mg total) by mouth daily. 90 capsule 3   No facility-administered medications prior to visit.     No Known Allergies  Review of Systems    Constitutional: Negative for fever and malaise/fatigue.  HENT: Negative for congestion.   Eyes: Negative for blurred vision.  Respiratory: Negative for cough and shortness of breath.   Cardiovascular: Negative for chest pain, palpitations and leg swelling.  Gastrointestinal: Negative for vomiting.  Musculoskeletal: Negative for back pain.  Skin: Negative for rash.  Neurological: Positive for headaches. Negative for loss of consciousness.       Objective:    Physical Exam  Constitutional: She is oriented to person, place, and time. She appears well-developed and well-nourished. No distress.  HENT:  Head: Normocephalic and atraumatic.  Eyes: Conjunctivae are normal.  Neck: Normal range of motion. No thyromegaly present.  Cardiovascular: Normal rate and regular rhythm.   Pulmonary/Chest: Effort normal and breath sounds normal. She has no wheezes.  Abdominal: Soft. Bowel sounds are normal. There is no tenderness.  Musculoskeletal: Normal range of motion. She exhibits no edema or deformity.  Neurological: She is alert and oriented to person, place, and time.  Skin: Skin is warm and dry. She is not diaphoretic.  Psychiatric: She has a normal mood and affect.    BP 126/86 (BP Location: Left Arm, Patient Position: Sitting, Cuff Size: Normal)   Pulse 67   Temp 97.8 F (36.6 C) (Oral)   Ht 5\' 5"  (1.651 m)   Wt 213 lb 12.8 oz (97 kg)   LMP 12/15/2010   SpO2 96%   BMI 35.58 kg/m  Wt Readings from Last 3 Encounters:  06/28/16 213 lb 12.8 oz (97 kg)  02/09/16 211 lb (95.7 kg)  12/05/15 211 lb 6 oz (95.9 kg)     Lab Results  Component Value Date   WBC 10.0 06/25/2016   HGB 14.0 06/25/2016   HCT 41.2 06/25/2016   PLT 302.0 06/25/2016   GLUCOSE 85 06/25/2016   CHOL 179 06/25/2016   TRIG 121.0 06/25/2016   HDL 55.30 06/25/2016   LDLCALC 100 (H) 06/25/2016   ALT 19 06/25/2016   AST 20 06/25/2016   NA 137 06/25/2016   K 3.9 06/25/2016   CL 102 06/25/2016   CREATININE 0.78  06/25/2016   BUN 17 06/25/2016   CO2 27 06/25/2016   TSH 1.75 06/25/2016   HGBA1C 5.9 06/25/2016   MICROALBUR 1.8 03/22/2016    Lab Results  Component Value Date   TSH 1.75 06/25/2016   Lab Results  Component Value Date   WBC 10.0 06/25/2016   HGB 14.0 06/25/2016   HCT 41.2 06/25/2016   MCV 82.8 06/25/2016   PLT 302.0 06/25/2016   Lab Results  Component Value Date   NA 137 06/25/2016   K 3.9 06/25/2016   CO2 27  06/25/2016   GLUCOSE 85 06/25/2016   BUN 17 06/25/2016   CREATININE 0.78 06/25/2016   BILITOT 0.4 06/25/2016   ALKPHOS 66 06/25/2016   AST 20 06/25/2016   ALT 19 06/25/2016   PROT 7.5 06/25/2016   ALBUMIN 4.3 06/25/2016   CALCIUM 9.8 06/25/2016   GFR 83.64 06/25/2016   Lab Results  Component Value Date   CHOL 179 06/25/2016   Lab Results  Component Value Date   HDL 55.30 06/25/2016   Lab Results  Component Value Date   LDLCALC 100 (H) 06/25/2016   Lab Results  Component Value Date   TRIG 121.0 06/25/2016   Lab Results  Component Value Date   CHOLHDL 3 06/25/2016   Lab Results  Component Value Date   HGBA1C 5.9 06/25/2016       Assessment & Plan:   Problem List Items Addressed This Visit    Preventative health care    Patient encouraged to maintain heart healthy diet, regular exercise, adequate sleep. Consider daily probiotics. Take medications as prescribed. Given and reviewed copy of ACP documents from [place] of State and encouraged to complete and return      Relevant Orders   CBC   Comprehensive metabolic panel   TSH   Lipid panel   Hemoglobin A1c   Headache    migraines better but gets tension headaches daily.       Relevant Medications   diltiazem (CARDIZEM CD) 240 MG 24 hr capsule   Hypothyroid    On Levothyroxine, continue to monitor      Relevant Orders   TSH   Depression with anxiety    Doing well on Venlafaxine she reports she is doing as well as she has done in years. Continue current meds.        Hyperlipidemia, mixed    Encouraged heart healthy diet, increase exercise, avoid trans fats, consider a krill oil cap daily      Relevant Medications   diltiazem (CARDIZEM CD) 240 MG 24 hr capsule   Other Relevant Orders   Comprehensive metabolic panel   Lipid panel   Type 2 diabetes mellitus without complication, without long-term current use of insulin (HCC)    hgba1c acceptable, minimize simple carbs. Increase exercise as tolerated. Continue current meds      Relevant Orders   Comprehensive metabolic panel   Hemoglobin A1c   Obesity    Encouraged DASH diet, decrease po intake and increase exercise as tolerated. Needs 7-8 hours of sleep nightly. Avoid trans fats, eat small, frequent meals every 4-5 hours with lean proteins, complex carbs and healthy fats. Minimize simple carbs. Consider bariatric referral, she declines for now because she has lost nearly 30# since last January.      Relevant Orders   CBC   GERD (gastroesophageal reflux disease)    Avoid offending foods, start probiotics. Do not eat large meals in late evening and consider raising head of bed. Using Ranitidine qhs. May titrate off as weight comes down.       Relevant Orders   CBC      I am having Ms. Rinella maintain her ranitidine, glucose blood, ONETOUCH DELICA LANCETS 99991111, traMADol, diltiazem, venlafaxine XR, levothyroxine, and diltiazem.  Meds ordered this encounter  Medications  . diltiazem (CARDIZEM CD) 240 MG 24 hr capsule    Sig: Take 1 capsule by mouth daily.    CMA served as Education administrator during this visit. History, Physical and Plan performed by medical provider. Documentation and orders  reviewed and attested to.  Penni Homans, MD

## 2016-06-28 NOTE — Assessment & Plan Note (Addendum)
Avoid offending foods, start probiotics. Do not eat large meals in late evening and consider raising head of bed. Using Ranitidine qhs. May titrate off as weight comes down.

## 2016-06-28 NOTE — Progress Notes (Signed)
Pre visit review using our clinic review tool, if applicable. No additional management support is needed unless otherwise documented below in the visit note. 

## 2016-07-01 NOTE — Assessment & Plan Note (Addendum)
Doing well on Venlafaxine she reports she is doing as well as she has done in years. Continue current meds.

## 2016-08-16 ENCOUNTER — Other Ambulatory Visit: Payer: Self-pay | Admitting: Family Medicine

## 2016-08-16 MED ORDER — VENLAFAXINE HCL ER 75 MG PO CP24: 75.0000 mg | ORAL_CAPSULE | Freq: Every day | ORAL | 1 refills | Status: DC

## 2016-09-07 ENCOUNTER — Other Ambulatory Visit: Payer: Self-pay | Admitting: Family Medicine

## 2016-12-03 ENCOUNTER — Other Ambulatory Visit: Payer: Self-pay | Admitting: Obstetrics and Gynecology

## 2016-12-03 DIAGNOSIS — N63 Unspecified lump in unspecified breast: Secondary | ICD-10-CM

## 2016-12-09 ENCOUNTER — Other Ambulatory Visit: Payer: Self-pay | Admitting: Cardiology

## 2016-12-11 ENCOUNTER — Other Ambulatory Visit: Payer: Self-pay | Admitting: Obstetrics and Gynecology

## 2016-12-11 ENCOUNTER — Ambulatory Visit
Admission: RE | Admit: 2016-12-11 | Discharge: 2016-12-11 | Disposition: A | Discharge status: Home / Self Care | Payer: 59 | Source: Ambulatory Visit | Attending: Obstetrics and Gynecology | Admitting: Obstetrics and Gynecology

## 2016-12-11 DIAGNOSIS — N63 Unspecified lump in unspecified breast: Secondary | ICD-10-CM

## 2017-03-02 ENCOUNTER — Other Ambulatory Visit: Payer: Self-pay | Admitting: Family Medicine

## 2017-03-19 ENCOUNTER — Ambulatory Visit (INDEPENDENT_AMBULATORY_CARE_PROVIDER_SITE_OTHER): Payer: 59 | Admitting: Physician Assistant

## 2017-03-19 VITALS — BP 120/88 | HR 75 | Ht 65.0 in | Wt 216.0 lb

## 2017-03-19 DIAGNOSIS — E785 Hyperlipidemia, unspecified: Secondary | ICD-10-CM

## 2017-03-19 DIAGNOSIS — E119 Type 2 diabetes mellitus without complications: Secondary | ICD-10-CM | POA: Diagnosis not present

## 2017-03-19 DIAGNOSIS — E039 Hypothyroidism, unspecified: Secondary | ICD-10-CM | POA: Diagnosis not present

## 2017-03-19 DIAGNOSIS — I10 Essential (primary) hypertension: Secondary | ICD-10-CM | POA: Diagnosis not present

## 2017-03-19 DIAGNOSIS — I471 Supraventricular tachycardia: Secondary | ICD-10-CM | POA: Diagnosis not present

## 2017-03-19 MED ORDER — DILTIAZEM HCL ER COATED BEADS 300 MG PO CP24: 300.0000 mg | ORAL_CAPSULE | Freq: Every day | ORAL | 3 refills | Status: DC

## 2017-03-19 NOTE — Progress Notes (Signed)
Cardiology Office Note    Date:  03/21/2017   ID:  Ebony [name], DOB 10-30-1965, MRN [id number]  PCP:  Mosie Lukes, MD  Cardiologist:  Dr. Stanford Breed  Chief Complaint  Patient presents with  . Follow-up    seen for Dr. Stanford Breed    History of Present Illness:  Ebony [name] is a 51 y.o. female with PMH of DM II, HTN, HLD and SVT. Echocardiogram in August 2014 showed normal LV function, grade 2 diastolic dysfunction, moderate LAE. Although she has been told in the past she had bicuspid aortic valve, this was not seen on the last echocardiogram. Her monitor in October 2014 showing intermittent SVT. Toprol-XL was added but she developed fatigue prompted the change to Cardizem. She was last seen by Dr. Stanford Breed on 02/09/2016, she had occasional bouts of SVT, otherwise she has been doing very well. Her Cardizem was increased to 240 mg daily.  Since the last office visit, she is having monthly episode of SVT. I will increase diltiazem to 300 mg daily. Otherwise she has been doing very well denies any chest discomfort or shortness of breath. On physical exam, there is no obvious aortic heart murmur. She can follow-up in one year.    Past Medical History:  Diagnosis Date  . BCC (basal cell carcinoma), face    skin   . Chicken pox as a child  . Concussion with no loss of consciousness 12/28/2014  . Depression with anxiety 10/06/2012  . Diabetes mellitus without complication ([place])   . GERD (gastroesophageal reflux disease)   . GERD (gastroesophageal reflux disease) 09/05/2015  . Headache(784.0)    otc meds prn, migraine  . Hyperlipidemia   . Hyperlipidemia, mixed 12/28/2014  . Hypertension    only with preg. 73yrs ago 2002- no meds  . Hypothyroid 06/15/2012  . Obesity 09/05/2015  . Pain of left calf 10/01/2014  . Preventative health care 06/13/2012  . SVT (supraventricular tachycardia) ([place])   . Tinea pedis 12/28/2014    Past Surgical History:  Procedure Laterality Date  .  ABDOMINAL HYSTERECTOMY  03/04/2012   Procedure: HYSTERECTOMY ABDOMINAL;  Surgeon: Marylynn Pearson, MD;  Location: [facility];  Service: Gynecology;  Laterality: N/A;  with lysis of adhesions  . CESAREAN SECTION  2002  . LAPAROSCOPIC TUBAL LIGATION  01/03/2011   Procedure: LAPAROSCOPIC TUBAL LIGATION;  Surgeon: Marylynn Pearson;  Location: [facility];  Service: Gynecology;  Laterality: Bilateral;  with filshie clips  . LAPAROSCOPY  03/04/2012   Procedure: LAPAROSCOPY DIAGNOSTIC;  Surgeon: Marylynn Pearson, MD;  Location: [facility];  Service: Gynecology;  Laterality: N/A;  with lysis of adhesions  . TONSILLECTOMY      Current Medications: Outpatient Medications Prior to Visit  Medication Sig Dispense Refill  . glucose blood (ONETOUCH VERIO) test strip Test once daily to check blood sugar.  DX 11.9 50 each 6  . levothyroxine (SYNTHROID, LEVOTHROID) 75 MCG tablet TAKE 1 TABLET BY MOUTH DAILY BEFORE BREAKFAST. 90 tablet 3  . ONETOUCH DELICA LANCETS 23N MISC Test once daily to check blood sugar.  DX 11.9 50 each 6  . ranitidine (ZANTAC) 150 MG tablet Take 150 mg by mouth at bedtime.      Marland Kitchen venlafaxine XR (EFFEXOR-XR) 75 MG 24 hr capsule TAKE ONE CAPSULE BY MOUTH EVERY DAY WITH BREAKFAST *CAN FILL 08/25/16 90 capsule 1  . diltiazem (CARDIZEM CD) 240 MG 24 hr capsule Take 1 capsule by mouth daily.    Marland Kitchen diltiazem (CARDIZEM CD) 240 MG  24 hr capsule TAKE 1 CAPSULE (240 MG TOTAL) BY MOUTH DAILY. 90 capsule 2  . traMADol (ULTRAM) 50 MG tablet Take 1 tablet (50 mg total) by mouth every 8 (eight) hours as needed. (Patient not taking: Reported on 03/19/2017) 60 tablet 0   No facility-administered medications prior to visit.      Allergies:   Patient has no known allergies.   Social History   Social History  . Marital status: Married    Spouse name: N/A  . Number of children: 1  . Years of education: N/A   Occupational History  .      House wife   Social History Main Topics  . Smoking status: Never Smoker  .  Smokeless tobacco: Never Used  . Alcohol use Yes     Comment: occasionally   . Drug use: No  . Sexual activity: Yes    Birth control/ protection: Surgical   Other Topics Concern  . None   Social History Narrative  . None     Family History:  The patient's family history includes Breast cancer (age of onset: 47) in her cousin; Breast cancer (age of onset: 17) in her mother; Cancer in her brother, maternal grandfather, and mother; Depression in her mother; Emphysema in her maternal grandfather; Hypertension in her mother; Other in her maternal grandmother.   ROS:   Please see the history of present illness.    ROS All other systems reviewed and are negative.   PHYSICAL EXAM:   VS:  BP 120/88   Pulse 75   Ht 5\' 5"  (1.651 m)   Wt 216 lb (98 kg)   LMP 12/15/2010   BMI 35.94 kg/m    GEN: Well nourished, well developed, in no acute distress  HEENT: normal  Neck: no JVD, carotid bruits, or masses Cardiac: RRR; no murmurs, rubs, or gallops,no edema  Respiratory:  clear to auscultation bilaterally, normal work of breathing GI: soft, nontender, nondistended, + BS MS: no deformity or atrophy  Skin: warm and dry, no rash Neuro:  Alert and Oriented x 3, Strength and sensation are intact Psych: euthymic mood, full affect  Wt Readings from Last 3 Encounters:  03/19/17 216 lb (98 kg)  06/28/16 213 lb 12.8 oz (97 kg)  02/09/16 211 lb (95.7 kg)      Studies/Labs Reviewed:   EKG:  EKG is ordered today.  The ekg ordered today demonstrates Normal sinus rhythm no significant ST-T wave changes.  Recent Labs: 06/25/2016: ALT 19; BUN 17; Creatinine, Ser 0.78; Hemoglobin 14.0; Platelets 302.0; Potassium 3.9; Sodium 137; TSH 1.75   Lipid Panel    Component Value Date/Time   CHOL 179 06/25/2016 1009   TRIG 121.0 06/25/2016 1009   HDL 55.30 06/25/2016 1009   CHOLHDL 3 06/25/2016 1009   VLDL 24.2 06/25/2016 1009   LDLCALC 100 (H) 06/25/2016 1009    Additional studies/ records that  were reviewed today include:   Echo 01/26/2013 LV EF: 55% -  60%  Study Conclusions  - Left ventricle: Wall thickness was increased in a pattern of mild LVH. Systolic function was normal. The estimated ejection fraction was in the range of 55% to 60%. Features are consistent with a pseudonormal left ventricular filling pattern, with concomitant abnormal relaxation and increased filling pressure (grade 2 diastolic dysfunction). - Left atrium: The atrium was mildly dilated.   ASSESSMENT:    1. SVT (supraventricular tachycardia) ([place])   2. Essential hypertension   3. Hyperlipidemia, unspecified hyperlipidemia type  4. Controlled type 2 diabetes mellitus without complication, without long-term current use of insulin ([place])   5. Hypothyroidism, unspecified type      PLAN:  In order of problems listed above:  1. SVT: She continued to have mostly episode of palpitation, will increase diltiazem to 300 mg daily.  2. Hypertension: Blood pressure well controlled  3. Hyperlipidemia: Not on statin medication. Last lipid panel January 2018 showed total cholesterol 179, HDL 55, LDL 100, triglycerides 121. Continue diet and exercise  4. Hypothyroidism: on Synthroid  5. DM 2: Will defer to primary care provider    Medication Adjustments/Labs and Tests Ordered: Current medicines are reviewed at length with the patient today.  Concerns regarding medicines are outlined above.  Medication changes, Labs and Tests ordered today are listed in the Patient Instructions below. Patient Instructions  Medication Instructions:   INCREASE DILTIAZEM TO 300 MG WHEN FINISHED WITH CURRENT SUPPLY  Follow-Up:  Your physician wants you to follow-up in: [place] will receive a reminder letter in the mail two months in advance. If you don't receive a letter, please call our office to schedule the follow-up appointment.   If you need a refill on your cardiac medications  before your next appointment, please call your pharmacy.       Hilbert Corrigan, [place]  03/21/2017 7:05 AM    [address], [place], [place]  [address] Phone: [phone]; Fax: [phone]

## 2017-03-19 NOTE — Patient Instructions (Signed)
Medication Instructions:   INCREASE DILTIAZEM TO 300 MG WHEN FINISHED WITH CURRENT SUPPLY  Follow-Up:  Your physician wants you to follow-up in: [place] will receive a reminder letter in the mail two months in advance. If you don't receive a letter, please call our office to schedule the follow-up appointment.   If you need a refill on your cardiac medications before your next appointment, please call your pharmacy.

## 2017-03-21 ENCOUNTER — Encounter: Payer: Self-pay | Admitting: Physician Assistant

## 2017-04-10 LAB — HM DIABETES EYE EXAM

## 2017-04-23 ENCOUNTER — Encounter: Payer: Self-pay | Admitting: Family Medicine

## 2017-05-08 ENCOUNTER — Encounter: Payer: Self-pay | Admitting: Family Medicine

## 2017-05-08 NOTE — Telephone Encounter (Signed)
Can you triage Pt?

## 2017-05-21 ENCOUNTER — Encounter: Payer: Self-pay | Admitting: Family Medicine

## 2017-06-05 ENCOUNTER — Ambulatory Visit: Payer: Self-pay | Admitting: Family Medicine

## 2017-06-05 NOTE — Progress Notes (Deleted)
[facility] [address], [place] [address] Phone: [phone] Subjective:    I'm seeing this patient by the request  of:  Mosie Lukes, MD   CC:   [id number]  MIAA LATTERELL is a 52 y.o. female coming in with complaint of ***  Onset-  Location Duration-  Character- Aggravating factors- Reliving factors-  Therapies tried-  Severity-     Past Medical History:  Diagnosis Date  . BCC (basal cell carcinoma), face    skin   . Chicken pox as a child  . Concussion with no loss of consciousness 12/28/2014  . Depression with anxiety 10/06/2012  . Diabetes mellitus without complication ([place])   . GERD (gastroesophageal reflux disease)   . GERD (gastroesophageal reflux disease) 09/05/2015  . Headache(784.0)    otc meds prn, migraine  . Hyperlipidemia   . Hyperlipidemia, mixed 12/28/2014  . Hypertension    only with preg. 62yrs ago 2002- no meds  . Hypothyroid 06/15/2012  . Obesity 09/05/2015  . Pain of left calf 10/01/2014  . Preventative health care 06/13/2012  . SVT (supraventricular tachycardia) ([place])   . Tinea pedis 12/28/2014   Past Surgical History:  Procedure Laterality Date  . ABDOMINAL HYSTERECTOMY  03/04/2012   Procedure: HYSTERECTOMY ABDOMINAL;  Surgeon: Marylynn Pearson, MD;  Location: [facility];  Service: Gynecology;  Laterality: N/A;  with lysis of adhesions  . CESAREAN SECTION  2002  . LAPAROSCOPIC TUBAL LIGATION  01/03/2011   Procedure: LAPAROSCOPIC TUBAL LIGATION;  Surgeon: Marylynn Pearson;  Location: [facility];  Service: Gynecology;  Laterality: Bilateral;  with filshie clips  . LAPAROSCOPY  03/04/2012   Procedure: LAPAROSCOPY DIAGNOSTIC;  Surgeon: Marylynn Pearson, MD;  Location: [facility];  Service: Gynecology;  Laterality: N/A;  with lysis of adhesions  . TONSILLECTOMY     Social History   Socioeconomic History  . Marital status: Married    Spouse name: Not on file  . Number of children: 1  . Years of education: Not on  file  . Highest education level: Not on file  Social Needs  . Financial resource strain: Not on file  . Food insecurity - worry: Not on file  . Food insecurity - inability: Not on file  . Transportation needs - medical: Not on file  . 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 Smoking status: Never Smoker  . Smokeless tobacco: Never Used  Substance and Sexual Activity  . Alcohol use: Yes    Comment: occasionally   . Drug use: No  . Sexual activity: Yes    Birth control/protection: Surgical  Other Topics Concern  . Not on file  Social History Narrative  . Not on file   No Known Allergies Family History  Problem Relation Age of Onset  . Cancer Mother        lung (06-13-11) breast -10 years ago  . Hypertension Mother   . Depression Mother   . Breast cancer Mother 39  . Cancer Brother        skin  . Other Maternal Grandmother        spinal stenosis  . Cancer Maternal Grandfather        prostate  . Emphysema Maternal Grandfather   . Breast cancer Cousin 40     Past medical history, social, surgical and family history all reviewed in electronic medical record.  No pertanent information unless  stated regarding to the chief complaint.   Review of Systems:Review of systems updated and as accurate as of 06/05/17  No headache, visual changes, nausea, vomiting, diarrhea, constipation, dizziness, abdominal pain, skin rash, fevers, chills, night sweats, weight loss, swollen lymph nodes, body aches, joint swelling, muscle aches, chest pain, shortness of breath, mood changes.   Objective  Last menstrual period 12/15/2010. Systems examined below as of 06/05/17   General: No apparent distress alert and oriented x3 mood and affect normal, dressed appropriately.  HEENT: Pupils equal, extraocular movements intact  Respiratory: Patient's speak in full sentences and does not appear short of breath  Cardiovascular: No  lower extremity edema, non tender, no erythema  Skin: Warm dry intact with no signs of infection or rash on extremities or on axial skeleton.  Abdomen: Soft nontender  Neuro: Cranial nerves II through XII are intact, neurovascularly intact in all extremities with 2+ DTRs and 2+ pulses.  Lymph: No lymphadenopathy of posterior or anterior cervical chain or axillae bilaterally.  Gait normal with good balance and coordination.  MSK:  Non tender with full range of motion and good stability and symmetric strength and tone of shoulders, elbows, wrist, hip, knee and ankles bilaterally.     Impression and Recommendations:     This case required medical decision making of moderate complexity.      Note: This dictation was prepared with Dragon dictation along with smaller phrase technology. Any transcriptional errors that result from this process are unintentional.

## 2017-06-13 ENCOUNTER — Ambulatory Visit
Admission: RE | Admit: 2017-06-13 | Discharge: 2017-06-13 | Disposition: A | Discharge status: Home / Self Care | Payer: 59 | Source: Ambulatory Visit | Attending: Obstetrics and Gynecology | Admitting: Obstetrics and Gynecology

## 2017-06-13 ENCOUNTER — Other Ambulatory Visit: Payer: Self-pay

## 2017-06-13 DIAGNOSIS — N63 Unspecified lump in unspecified breast: Secondary | ICD-10-CM

## 2017-06-13 DIAGNOSIS — R928 Other abnormal and inconclusive findings on diagnostic imaging of breast: Secondary | ICD-10-CM | POA: Diagnosis not present

## 2017-06-13 DIAGNOSIS — N6310 Unspecified lump in the right breast, unspecified quadrant: Secondary | ICD-10-CM | POA: Diagnosis not present

## 2017-06-25 DIAGNOSIS — M25551 Pain in right hip: Secondary | ICD-10-CM | POA: Diagnosis not present

## 2017-06-25 DIAGNOSIS — I7091 Generalized atherosclerosis: Secondary | ICD-10-CM | POA: Diagnosis not present

## 2017-07-03 DIAGNOSIS — M25551 Pain in right hip: Secondary | ICD-10-CM | POA: Diagnosis not present

## 2017-08-01 DIAGNOSIS — Z01419 Encounter for gynecological examination (general) (routine) without abnormal findings: Secondary | ICD-10-CM | POA: Diagnosis not present

## 2017-08-01 DIAGNOSIS — Z808 Family history of malignant neoplasm of other organs or systems: Secondary | ICD-10-CM | POA: Diagnosis not present

## 2017-08-01 DIAGNOSIS — Z6835 Body mass index (BMI) 35.0-35.9, adult: Secondary | ICD-10-CM | POA: Diagnosis not present

## 2017-08-01 DIAGNOSIS — Z803 Family history of malignant neoplasm of breast: Secondary | ICD-10-CM | POA: Diagnosis not present

## 2017-08-01 DIAGNOSIS — Z801 Family history of malignant neoplasm of trachea, bronchus and lung: Secondary | ICD-10-CM | POA: Diagnosis not present

## 2017-08-19 DIAGNOSIS — M25551 Pain in right hip: Secondary | ICD-10-CM | POA: Diagnosis not present

## 2017-08-29 DIAGNOSIS — M67853 Other specified disorders of tendon, right hip: Secondary | ICD-10-CM | POA: Diagnosis not present

## 2017-08-30 DIAGNOSIS — C44310 Basal cell carcinoma of skin of unspecified parts of face: Secondary | ICD-10-CM | POA: Insufficient documentation

## 2017-09-12 DIAGNOSIS — M6798 Unspecified disorder of synovium and tendon, other site: Secondary | ICD-10-CM | POA: Diagnosis not present

## 2017-09-13 ENCOUNTER — Other Ambulatory Visit: Payer: Self-pay | Admitting: Family Medicine

## 2017-09-16 DIAGNOSIS — M6798 Unspecified disorder of synovium and tendon, other site: Secondary | ICD-10-CM | POA: Diagnosis not present

## 2017-09-16 DIAGNOSIS — Z803 Family history of malignant neoplasm of breast: Secondary | ICD-10-CM | POA: Diagnosis not present

## 2017-09-16 DIAGNOSIS — Z780 Asymptomatic menopausal state: Secondary | ICD-10-CM | POA: Diagnosis not present

## 2017-09-16 DIAGNOSIS — Z809 Family history of malignant neoplasm, unspecified: Secondary | ICD-10-CM | POA: Diagnosis not present

## 2017-09-17 ENCOUNTER — Other Ambulatory Visit: Payer: Self-pay | Admitting: Obstetrics and Gynecology

## 2017-09-17 DIAGNOSIS — Z803 Family history of malignant neoplasm of breast: Secondary | ICD-10-CM

## 2017-09-19 DIAGNOSIS — M6798 Unspecified disorder of synovium and tendon, other site: Secondary | ICD-10-CM | POA: Diagnosis not present

## 2017-09-23 DIAGNOSIS — M6798 Unspecified disorder of synovium and tendon, other site: Secondary | ICD-10-CM | POA: Diagnosis not present

## 2017-09-26 ENCOUNTER — Ambulatory Visit
Admission: RE | Admit: 2017-09-26 | Discharge: 2017-09-26 | Disposition: A | Discharge status: Home / Self Care | Payer: 59 | Source: Ambulatory Visit | Attending: Obstetrics and Gynecology | Admitting: Obstetrics and Gynecology

## 2017-09-26 DIAGNOSIS — M6798 Unspecified disorder of synovium and tendon, other site: Secondary | ICD-10-CM | POA: Diagnosis not present

## 2017-09-26 DIAGNOSIS — Z803 Family history of malignant neoplasm of breast: Secondary | ICD-10-CM

## 2017-09-26 MED ORDER — GADOBENATE DIMEGLUMINE 529 MG/ML IV SOLN
20.0000 mL | Freq: Once | INTRAVENOUS | Status: AC | PRN
  Administered 2017-09-26: 20 mL via INTRAVENOUS

## 2017-09-30 ENCOUNTER — Telehealth: Payer: Self-pay | Admitting: *Deleted

## 2017-09-30 DIAGNOSIS — M6798 Unspecified disorder of synovium and tendon, other site: Secondary | ICD-10-CM | POA: Diagnosis not present

## 2017-09-30 NOTE — Telephone Encounter (Signed)
Called and scheduled the patient for a new patient appt on May 7th at 10:15am.   Silvano Bilis at Dr. Julien Girt and left a message with the appt date/time.

## 2017-10-03 DIAGNOSIS — M533 Sacrococcygeal disorders, not elsewhere classified: Secondary | ICD-10-CM | POA: Diagnosis not present

## 2017-10-03 DIAGNOSIS — M6798 Unspecified disorder of synovium and tendon, other site: Secondary | ICD-10-CM | POA: Diagnosis not present

## 2017-10-07 DIAGNOSIS — M6798 Unspecified disorder of synovium and tendon, other site: Secondary | ICD-10-CM | POA: Diagnosis not present

## 2017-10-08 ENCOUNTER — Encounter: Payer: Self-pay | Admitting: Obstetrics

## 2017-10-08 ENCOUNTER — Inpatient Hospital Stay: Payer: 59 | Attending: Obstetrics | Admitting: Obstetrics

## 2017-10-08 VITALS — BP 134/92 | HR 75 | Temp 98.6°F | Resp 20 | Ht 65.0 in | Wt 218.5 lb

## 2017-10-08 DIAGNOSIS — Z803 Family history of malignant neoplasm of breast: Secondary | ICD-10-CM | POA: Diagnosis not present

## 2017-10-08 DIAGNOSIS — Z1502 Genetic susceptibility to malignant neoplasm of ovary: Secondary | ICD-10-CM | POA: Insufficient documentation

## 2017-10-08 NOTE — Patient Instructions (Addendum)
1. We will plan your surgery in October. Potential dates include October 2, October 3, October 10, October 24, October 30, October 31.  If you wanted to have a spot held for you, you can call our office at [phone] to let us know. 2. I will order another ultrasound 1 week prior to your return visit. 3. Followup in September for surgical planning and ultrasound review

## 2017-10-09 NOTE — Progress Notes (Signed)
Consult Note: Gyn-Onc  Consult was requested by Dr. Marylynn Pearson  CC:  Chief Complaint  Patient presents with  . Genetic predisposition to ovarian cancer    HPI: [name]  is a very nice 52 y.o. P1  The patient presented for well woman exam with Dr. Marylynn Pearson.  Notable was her family history of breast cancer.  Genetic testing was ordered on her visit February 2019.  [facility] my risk panel testing revealed a mutation in BRIP1. 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  She has already received these results and had genetic counseling with both Dr. Julien Girt and through [facility].  She wishes to proceed with prophylactic RRSO.  She has had an ultrasound dated 09/25/2017 showing no adnexal mass no fluid in the cul-de-sac surgically absent uterus.  A Ca1 25 was also obtained with Dr. Julien Girt equal to 6 on 09/16/2017.  She has no complaints today.  She denies bloating denies pelvic pain denies urinary frequency denies nausea denies appetite change denies unintentional weight loss.   Measurement of disease:  Ca125 used a screening tool . 09/16/2017 = 6  Radiology: . 09/25/2017-transvaginal ultrasound-physician for [facility] of [place]-no adnexal masses.  Current Meds:  Outpatient Encounter Medications as of 10/08/2017  Medication Sig  . diltiazem (CARDIZEM CD) 300 MG 24 hr capsule Take 1 capsule (300 mg total) by mouth daily.  Marland Kitchen glucose blood (ONETOUCH VERIO) test strip Test once daily to check blood sugar.  DX 11.9  . levothyroxine (SYNTHROID, LEVOTHROID) 75 MCG tablet TAKE 1 TABLET BY MOUTH DAILY BEFORE BREAKFAST.  Marland Kitchen ONETOUCH DELICA LANCETS 78G MISC Test once daily to check blood sugar.  DX 11.9  . ranitidine (ZANTAC) 150 MG tablet Take 150 mg by mouth at bedtime.    Marland Kitchen venlafaxine XR (EFFEXOR-XR) 75 MG 24 hr capsule TAKE ONE CAPSULE BY MOUTH EVERY DAY WITH BREAKFAST   No  facility-administered encounter medications on file as of 10/08/2017.     Allergy: No Known Allergies  Social Hx:   Social History   Socioeconomic History  . Marital status: Married    Spouse name: Not on file  . Number of children: 1  . Years of education: Not on file  . 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 Financial resource strain: Not on file  . Food insecurity:    Worry: Not on file    Inability: Not on file  . Transportation needs:    Medical: Not on file    Non-medical: Not on file  Tobacco Use  . Smoking status: Never Smoker  . Smokeless tobacco: Never Used  Substance and Sexual Activity  . Alcohol use: Yes    Comment: occasionally   . Drug use: No  . Sexual activity: Yes    Birth control/protection: Surgical  Lifestyle  . Physical activity:    Days per week: Not on file    Minutes per session: Not on file  . Stress: Not on file  Relationships  . Social connections:    Talks on phone: Not on file    Gets together: Not on file    Attends religious service: Not on file    Active member of club or organization: Not on file    Attends meetings of clubs or organizations: Not on file    Relationship status: Not on file  . Intimate partner violence:  Fear of current or ex partner: Not on file    Emotionally abused: Not on file    Physically abused: Not on file    Forced sexual activity: Not on file  Other Topics Concern  . Not on file  Social History Narrative  . Not on file    Past Surgical Hx:  Past Surgical History:  Procedure Laterality Date  . ABDOMINAL HYSTERECTOMY  03/04/2012   Procedure: HYSTERECTOMY ABDOMINAL;  Surgeon: Marylynn Pearson, MD;  Location: [facility];  Service: Gynecology;  Laterality: N/A;  with lysis of adhesions  . BASAL CELL CARCINOMA EXCISION Left    cheek/face  . CESAREAN SECTION  2002   HELLP Syndrome  . DILATION AND CURETTAGE OF UTERUS  2007   Miscarriage  .  LAPAROSCOPIC TUBAL LIGATION  01/03/2011   Procedure: LAPAROSCOPIC TUBAL LIGATION;  Surgeon: Marylynn Pearson;  Location: [facility];  Service: Gynecology;  Laterality: Bilateral;  with filshie clips  . LAPAROSCOPY  03/04/2012   Procedure: LAPAROSCOPY DIAGNOSTIC;  Surgeon: Marylynn Pearson, MD;  Location: [facility];  Service: Gynecology;  Laterality: N/A;  with lysis of adhesions  . SHOULDER ARTHROSCOPY  2012  . TONSILLECTOMY      Past Medical Hx:  Past Medical History:  Diagnosis Date  . Arthritis   . BCC (basal cell carcinoma), face    skin   . Concussion with no loss of consciousness 12/28/2014  . Depression with anxiety 10/06/2012  . Diabetes mellitus without complication ([place])   . GERD (gastroesophageal reflux disease) 09/05/2015  . Headache(784.0)    otc meds prn, migraine  . Hyperlipidemia, mixed 12/28/2014  . Hypertension    only with preg. 63yr ago 2002- no meds  . Hypothyroid 06/15/2012  . Obesity 09/05/2015  . SVT (supraventricular tachycardia) ([place]   . Tinea pedis 12/28/2014    Past Gynecological History:   GYNECOLOGIC HISTORY:  Patient's last menstrual period was 12/15/2010. Menarche: 52years old P 1 LMP 2013 at time of hysterectomy Contraceptive oral contraceptives approximately 10 years HRT none  Last Pap states January 2019 however hysterectomy was performed for non-dysplastic reasons.  Family Hx:  Family History  Problem Relation Age of Onset  . Hypertension Mother   . Depression Mother   . Breast cancer Mother 54 . Lung cancer Mother        lung (06-13-11)  . Basal cell carcinoma Brother 40  . Other Maternal Grandmother        spinal stenosis  . Emphysema Maternal Grandfather   . Prostate cancer Maternal Grandfather   . Breast cancer Cousin 40    Review of Systems:  Review of Systems  Constitutional: Negative.   HENT:  Negative.   Eyes: Negative.   Respiratory: Negative.   Cardiovascular: Negative.   Gastrointestinal: Negative.   Endocrine: Negative.    Genitourinary: Negative.    Musculoskeletal: Negative.   Skin: Negative.   Neurological: Negative.   Hematological: Negative.   Psychiatric/Behavioral: Negative.      Vitals:  Blood pressure (!) 134/92, pulse 75, temperature 98.6 F (37 C), temperature source Oral, resp. rate 20, height _0  (1.651 m), weight 218 lb 8 oz (99.1 kg), last menstrual period 12/15/2010, SpO2 99 %. Body mass index is 36.36 kg/m.   Physical Exam: ECOG PERFORMANCE STATUS: 0 - Asymptomatic   General :  Well developed, 52y.o., female in no apparent distress HEENT:  Normocephalic/atraumatic, symmetric, EOMI, eyelids normal Neck:   Supple, no masses.  Lymphatics:  No cervical/ submandibular/ supraclavicular/  infraclavicular/ inguinal adenopathy Respiratory:  Respirations unlabored, no use of accessory muscles CV:   Deferred Breast:  Deferred Musculoskeletal: No CVA tenderness, normal muscle strength. Abdomen:  Overweight, soft, non-tender and nondistended. No evidence of hernia. No masses. Extremities:  No lymphedema, no erythema, non-tender. Skin:   Normal inspection Neuro/Psych:  No focal motor deficit, no abnormal mental status. Normal gait. Normal affect. Alert and oriented to person, place, and time  Genito Urinary: Vulva: Normal external female genitalia.  Bladder/urethra: Urethral meatus normal in size and location. No lesions or   masses, well supported bladder Speculum exam: Vagina: No lesion, no discharge, no bleeding. Cervix: Surgically absent lesions. Bimanual exam:  Uterus: Surgically absent  Adnexa: No masses. Rectovaginal:  Deferred  Oncologic Summary: 1. Genetic predisposition to breast and ovarian cancer   Assessment/Plan: 1. Genetic predisposition to ovarian cancer o We reviewed the genetic counseling she is already received and noting the increased risk of ovarian cancer up to 13% o By NCCN guidelines she would be offered risk reducing BSO starting at age 78. 2. She has  social constraints that she is wanting to put the procedure off until October o Given this I think we should go ahead and plan for repeat ultrasound 1 week prior to her follow-up visit preoperatively. 3. She does have a history of attempted laparoscopic hysterectomy which was converted to an open procedure o On review of the operative report it sounds like these were due to anterior abdominal wall bowel adhesions o Obviously a history of pelvic adhesive disease increases her risk for an open procedure at the time of RRSO o We can discuss this further on her return visit but certainly an attempt at at least diagnostic laparoscopy to determine feasibility of laparoscopic resection is reasonable. i. It may be that we want to do this robotically in the event some adhesio lysis is necessary.  Isabel Caprice, MD  10/28/2017, 1:34 PM  Cc: Marylynn Pearson, MD (Referring OB/GYN) Vivien Rossetti, MD (PCP)

## 2017-10-10 DIAGNOSIS — M6798 Unspecified disorder of synovium and tendon, other site: Secondary | ICD-10-CM | POA: Diagnosis not present

## 2017-10-17 DIAGNOSIS — M6798 Unspecified disorder of synovium and tendon, other site: Secondary | ICD-10-CM | POA: Diagnosis not present

## 2017-10-21 DIAGNOSIS — M6798 Unspecified disorder of synovium and tendon, other site: Secondary | ICD-10-CM | POA: Diagnosis not present

## 2017-10-24 DIAGNOSIS — M6798 Unspecified disorder of synovium and tendon, other site: Secondary | ICD-10-CM | POA: Diagnosis not present

## 2017-10-28 ENCOUNTER — Encounter: Payer: Self-pay | Admitting: Obstetrics

## 2017-10-28 DIAGNOSIS — Z1502 Genetic susceptibility to malignant neoplasm of ovary: Secondary | ICD-10-CM | POA: Insufficient documentation

## 2017-10-29 DIAGNOSIS — M6798 Unspecified disorder of synovium and tendon, other site: Secondary | ICD-10-CM | POA: Diagnosis not present

## 2018-02-04 ENCOUNTER — Telehealth: Payer: Self-pay

## 2018-02-04 NOTE — Telephone Encounter (Signed)
When reviewing appts for this Friday- realized pt cancelled her 9-4 appt for u/s.  Notified Joylene John NP and per her, pt will need her u/s results before appt with Dr Gerarda Fraction for this Friday, 9-6.  I called pt, no answer, left her a VM with our contact info, to call our office to so we can reschedule the u/s for a later date unless there's u/s appt available this week, her appt for Friday with Dr Gerarda Fraction will also need to be rescheduled.

## 2018-02-05 ENCOUNTER — Telehealth: Payer: Self-pay | Admitting: *Deleted

## 2018-02-05 ENCOUNTER — Ambulatory Visit (HOSPITAL_COMMUNITY): Payer: 59

## 2018-02-05 NOTE — Telephone Encounter (Signed)
Called patient to follow-up. Patient missed her ultrasound appointment that was scheduled for 02/05/18.  I spoke with patient, patient states " I am very sorry for missing my appointment, my family has went through a lot since I was last seen in your office, we found our that my son had a brain tumor and he recently just had surgery, I will need to postpone my ultrasound and reschedule my appointment with Dr. Gerarda Fraction til a later time, once I can make sure that my son is doing ok. I told the patient that our office was very sorry to here about her son and please just give our office a call when she was able to.  Patient was very appreciative and verbalized understanding.

## 2018-02-07 ENCOUNTER — Ambulatory Visit: Payer: 59 | Admitting: Obstetrics

## 2018-03-05 ENCOUNTER — Other Ambulatory Visit: Payer: Self-pay | Admitting: Family Medicine

## 2018-03-06 NOTE — Telephone Encounter (Signed)
Pt scheduled 03/28/2018 @ 11am.

## 2018-03-06 NOTE — Telephone Encounter (Signed)
Can you call and let patient know she needs to be seen before she can have her full prescription. I sent a 2 month supply she usually gets a 3 month supply

## 2018-03-24 ENCOUNTER — Telehealth: Payer: Self-pay | Admitting: Family Medicine

## 2018-03-24 NOTE — Telephone Encounter (Signed)
No patient can wait until her visit

## 2018-03-24 NOTE — Telephone Encounter (Signed)
Copied from [place] [id number]. Topic: Quick Communication - See Telephone Encounter >> Mar 24, 2018 11:04 AM Blase Mess A wrote: CRM for notification. See Telephone encounter for: 03/24/18.  Patient is calling to ask does she need to get labs done prior to check her her Friday 03/28/18 appt to check her thryoid and A1C  Patient call back [phone]

## 2018-03-28 ENCOUNTER — Ambulatory Visit: Payer: 59 | Admitting: Family Medicine

## 2018-03-28 ENCOUNTER — Encounter: Payer: Self-pay | Admitting: Family Medicine

## 2018-03-28 VITALS — BP 110/82 | HR 80 | Temp 98.1°F | Resp 18 | Wt 226.0 lb

## 2018-03-28 DIAGNOSIS — E039 Hypothyroidism, unspecified: Secondary | ICD-10-CM | POA: Diagnosis not present

## 2018-03-28 DIAGNOSIS — F418 Other specified anxiety disorders: Secondary | ICD-10-CM

## 2018-03-28 DIAGNOSIS — Z1589 Genetic susceptibility to other disease: Secondary | ICD-10-CM

## 2018-03-28 DIAGNOSIS — Z1211 Encounter for screening for malignant neoplasm of colon: Secondary | ICD-10-CM

## 2018-03-28 DIAGNOSIS — R51 Headache: Secondary | ICD-10-CM | POA: Diagnosis not present

## 2018-03-28 DIAGNOSIS — E6609 Other obesity due to excess calories: Secondary | ICD-10-CM

## 2018-03-28 DIAGNOSIS — Z0000 Encounter for general adult medical examination without abnormal findings: Secondary | ICD-10-CM | POA: Diagnosis not present

## 2018-03-28 DIAGNOSIS — E782 Mixed hyperlipidemia: Secondary | ICD-10-CM

## 2018-03-28 DIAGNOSIS — K219 Gastro-esophageal reflux disease without esophagitis: Secondary | ICD-10-CM

## 2018-03-28 DIAGNOSIS — E119 Type 2 diabetes mellitus without complications: Secondary | ICD-10-CM | POA: Diagnosis not present

## 2018-03-28 DIAGNOSIS — Z23 Encounter for immunization: Secondary | ICD-10-CM | POA: Diagnosis not present

## 2018-03-28 DIAGNOSIS — R519 Headache, unspecified: Secondary | ICD-10-CM

## 2018-03-28 DIAGNOSIS — Z1501 Genetic susceptibility to malignant neoplasm of breast: Secondary | ICD-10-CM

## 2018-03-28 LAB — COMPREHENSIVE METABOLIC PANEL
ALK PHOS: 65 U/L (ref 39–117)
ALT: 15 U/L (ref 0–35)
AST: 18 U/L (ref 0–37)
Albumin: 4.4 g/dL (ref 3.5–5.2)
BILIRUBIN TOTAL: 0.4 mg/dL (ref 0.2–1.2)
BUN: 14 mg/dL (ref 6–23)
CO2: 30 meq/L (ref 19–32)
Calcium: 9.8 mg/dL (ref 8.4–10.5)
Chloride: 105 mEq/L (ref 96–112)
Creatinine, Ser: 0.79 mg/dL (ref 0.40–1.20)
GFR: 81.82 mL/min (ref >60.00)
Glucose, Bld: 108 mg/dL — ABNORMAL HIGH (ref 70–99)
POTASSIUM: 4.6 meq/L (ref 3.5–5.1)
SODIUM: 142 meq/L (ref 135–145)
Total Protein: 7 g/dL (ref 6.0–8.3)

## 2018-03-28 LAB — CBC
HEMATOCRIT: 42.5 % (ref 36.0–46.0)
HEMOGLOBIN: 14.2 g/dL (ref 12.0–15.0)
MCHC: 33.4 g/dL (ref 30.0–36.0)
MCV: 83.8 fl (ref 78.0–100.0)
Platelets: 155 10*3/uL (ref 150.0–400.0)
RBC: 5.07 Mil/uL (ref 3.87–5.11)
RDW: 13.5 % (ref 11.5–15.5)
WBC: 7.6 10*3/uL (ref 4.0–10.5)

## 2018-03-28 LAB — TSH: TSH: 1.91 u[IU]/mL (ref 0.35–4.50)

## 2018-03-28 LAB — LIPID PANEL
CHOL/HDL RATIO: 4
CHOLESTEROL: 185 mg/dL (ref 0–200)
HDL: 49.5 mg/dL (ref >39.00)
LDL Cholesterol: 112 mg/dL — ABNORMAL HIGH (ref 0–99)
NonHDL: 135.84
TRIGLYCERIDES: 119 mg/dL (ref 0.0–149.0)
VLDL: 23.8 mg/dL (ref 0.0–40.0)

## 2018-03-28 LAB — HEMOGLOBIN A1C: Hgb A1c MFr Bld: 6.3 % (ref 4.6–6.5)

## 2018-03-28 MED ORDER — LEVOTHYROXINE SODIUM 75 MCG PO TABS: ORAL_TABLET | ORAL | 3 refills | Status: DC

## 2018-03-28 MED ORDER — FAMOTIDINE 40 MG PO TABS: 40.0000 mg | ORAL_TABLET | Freq: Every day | ORAL | 3 refills | Status: DC

## 2018-03-28 MED ORDER — VENLAFAXINE HCL ER 75 MG PO CP24: ORAL_CAPSULE | ORAL | 2 refills | Status: DC

## 2018-03-28 NOTE — Patient Instructions (Signed)

## 2018-03-30 DIAGNOSIS — Z1589 Genetic susceptibility to other disease: Secondary | ICD-10-CM

## 2018-03-30 DIAGNOSIS — Z1501 Genetic susceptibility to malignant neoplasm of breast: Secondary | ICD-10-CM | POA: Insufficient documentation

## 2018-03-30 NOTE — Assessment & Plan Note (Addendum)
Switched from Ranitidine to Famotidine. Has not been well controlled recently so referred to GI for further consideration.

## 2018-03-30 NOTE — Assessment & Plan Note (Signed)
Patient epots he gynecologist Dr Orvan Seen has tested her and found her to be positive. They have since had her undergo a Breast MI and [name] and no concerning diagnosis.

## 2018-03-30 NOTE — Progress Notes (Signed)
Subjective:    Patient ID: XOE HOE, female    DOB: [birth year], 52 y.o.   MRN: [id number]  No chief complaint on file.   HPI Patient is in today for annual preventative exam and follow up on chronic medical concerns including hypertension, hypelipidemia, and eflux. .  She feels well today but has had a very stressful year.  Her son was diagnosed at age 72 with a ganglioglioma.  Fortunately it was very operable in the location and he has done well with this treatment.  The patient herself is also been found to be BRI P+ with gene testing and has undergone breast MRI with good results so far.  She denies any recent febrile illness or acute concerns otherwise.  She feels her current medications of helped her manage the year well.  She is trying to eat well although she admits with all the stress she has not done an ideal job.  No regular exercise at present.  She is managing activities of daily living well. Denies CP/palp/SOB/HA/congestion/fevers/GI or GU c/o. Taking meds as prescribed  Past Medical History:  Diagnosis Date  . Arthritis   . BCC (basal cell carcinoma), face    skin   . Concussion with no loss of consciousness 12/28/2014  . Depression with anxiety 10/06/2012  . Diabetes mellitus without complication ([place])   . GERD (gastroesophageal reflux disease) 09/05/2015  . Headache(784.0)    otc meds prn, migraine  . Hyperlipidemia, mixed 12/28/2014  . Hypertension    only with preg. 66yr ago 2002- no meds  . Hypothyroid 06/15/2012  . Obesity 09/05/2015  . SVT (supraventricular tachycardia) ([place]   . Tinea pedis 12/28/2014    Past Surgical History:  Procedure Laterality Date  . ABDOMINAL HYSTERECTOMY  03/04/2012   Procedure: HYSTERECTOMY ABDOMINAL;  Surgeon: GMarylynn Pearson MD;  [facility];  Service: Gynecology;  Laterality: N/A;  with lysis of adhesions  . BASAL CELL CARCINOMA EXCISION Left    cheek/face  . CESAREAN SECTION  2002   HELLP Syndrome  . DILATION AND  CURETTAGE OF UTERUS  2007   Miscarriage  . LAPAROSCOPIC TUBAL LIGATION  01/03/2011   Procedure: LAPAROSCOPIC TUBAL LIGATION;  Surgeon: GMarylynn Pearson  [facility];  Service: Gynecology;  Laterality: Bilateral;  with filshie clips  . LAPAROSCOPY  03/04/2012   Procedure: LAPAROSCOPY DIAGNOSTIC;  Surgeon: GMarylynn Pearson MD;  [facility];  Service: Gynecology;  Laterality: N/A;  with lysis of adhesions  . SHOULDER ARTHROSCOPY  2012  . TONSILLECTOMY      Family History  Problem Relation Age of Onset  . Hypertension Mother   . Depression Mother   . Breast cancer Mother 597 . Lung cancer Mother        lung (06-13-11)  . Basal cell carcinoma Brother 40  . Other Maternal Grandmother        spinal stenosis  . Emphysema Maternal Grandfather   . Prostate cancer Maternal Grandfather   . Breast cancer Cousin 437   Social History   Socioeconomic History  . Marital status: Married    Spouse name: Not on file  . Number of children: 1  . Years of education: Not on file  . 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 Financial resource strain: Not on file  . Food insecurity:    Worry: Not on file    Inability: Not on file  .  Transportation needs:    Medical: Not on file    Non-medical: Not on file  Tobacco Use  . Smoking status: Never Smoker  . Smokeless tobacco: Never Used  Substance and Sexual Activity  . Alcohol use: Yes    Comment: occasionally   . Drug use: No  . Sexual activity: Yes    Birth control/protection: Surgical  Lifestyle  . Physical activity:    Days per week: Not on file    Minutes per session: Not on file  . Stress: Not on file  Relationships  . Social connections:    Talks on phone: Not on file    Gets together: Not on file    Attends religious service: Not on file    Active member of club or organization: Not on file    Attends meetings of clubs or organizations: Not on file    Relationship  status: Not on file  . Intimate partner violence:    Fear of current or ex partner: Not on file    Emotionally abused: Not on file    Physically abused: Not on file    Forced sexual activity: Not on file  Other Topics Concern  . Not on file  Social History Narrative  . Not on file    Outpatient Medications Prior to Visit  Medication Sig Dispense Refill  . diltiazem (CARDIZEM CD) 300 MG 24 hr capsule Take 1 capsule (300 mg total) by mouth daily. 90 capsule 3  . glucose blood (ONETOUCH VERIO) test strip Test once daily to check blood sugar.  DX 11.9 50 each 6  . ONETOUCH DELICA LANCETS 74Q MISC Test once daily to check blood sugar.  DX 11.9 50 each 6  . levothyroxine (SYNTHROID, LEVOTHROID) 75 MCG tablet TAKE 1 TABLET BY MOUTH DAILY BEFORE BREAKFAST. 90 tablet 3  . ranitidine (ZANTAC) 150 MG tablet Take 150 mg by mouth at bedtime.      Marland Kitchen venlafaxine XR (EFFEXOR-XR) 75 MG 24 hr capsule TAKE ONE CAPSULE BY MOUTH EVERY DAY WITH BREAKFAST 60 capsule 0   No facility-administered medications prior to visit.     No Known Allergies  Review of Systems  Constitutional: Negative for chills, fever and malaise/fatigue.  HENT: Negative for congestion and hearing loss.   Eyes: Negative for discharge.  Respiratory: Negative for cough, sputum production and shortness of breath.   Cardiovascular: Negative for chest pain, palpitations and leg swelling.  Gastrointestinal: Negative for abdominal pain, blood in stool, constipation, diarrhea, heartburn, nausea and vomiting.  Genitourinary: Negative for dysuria, frequency, hematuria and urgency.  Musculoskeletal: Negative for back pain, falls and myalgias.  Skin: Negative for rash.  Neurological: Negative for dizziness, sensory change, loss of consciousness, weakness and headaches.  Endo/Heme/Allergies: Negative for environmental allergies. Does not bruise/bleed easily.  Psychiatric/Behavioral: Negative for depression and suicidal ideas. The patient is  nervous/anxious. The patient does not have insomnia.        Objective:    Physical Exam  Constitutional: She is oriented to person, place, and time. She appears well-developed and well-nourished. No distress.  HENT:  Head: Normocephalic and atraumatic.  Eyes: Conjunctivae are normal.  Neck: Neck supple. No thyromegaly present.  Cardiovascular: Normal rate, regular rhythm and normal heart sounds.  No murmur heard. Pulmonary/Chest: Effort normal and breath sounds normal. No respiratory distress.  Abdominal: Soft. Bowel sounds are normal. She exhibits no distension and no mass. There is no tenderness.  Musculoskeletal: She exhibits no edema.  Lymphadenopathy:    She has  no cervical adenopathy.  Neurological: She is alert and oriented to person, place, and time.  Skin: Skin is warm and dry.  Psychiatric: She has a normal mood and affect. Her behavior is normal.    BP 110/82 (BP Location: Left Arm, Patient Position: Sitting, Cuff Size: Normal)   Pulse 80   Temp 98.1 F (36.7 C) (Oral)   Resp 18   Wt 226 lb (102.5 kg)   LMP 12/15/2010   SpO2 96%   BMI 37.61 kg/m  Wt Readings from Last 3 Encounters:  03/28/18 226 lb (102.5 kg)  10/08/17 218 lb 8 oz (99.1 kg)  03/19/17 216 lb (98 kg)     Lab Results  Component Value Date   WBC 7.6 03/28/2018   HGB 14.2 03/28/2018   HCT 42.5 03/28/2018   PLT 155.0 03/28/2018   GLUCOSE 108 (H) 03/28/2018   CHOL 185 03/28/2018   TRIG 119.0 03/28/2018   HDL 49.50 03/28/2018   LDLCALC 112 (H) 03/28/2018   ALT 15 03/28/2018   AST 18 03/28/2018   NA 142 03/28/2018   K 4.6 03/28/2018   CL 105 03/28/2018   CREATININE 0.79 03/28/2018   BUN 14 03/28/2018   CO2 30 03/28/2018   TSH 1.91 03/28/2018   HGBA1C 6.3 03/28/2018   MICROALBUR 1.8 03/22/2016    Lab Results  Component Value Date   TSH 1.91 03/28/2018   Lab Results  Component Value Date   WBC 7.6 03/28/2018   HGB 14.2 03/28/2018   HCT 42.5 03/28/2018   MCV 83.8 03/28/2018    PLT 155.0 03/28/2018   Lab Results  Component Value Date   NA 142 03/28/2018   K 4.6 03/28/2018   CO2 30 03/28/2018   GLUCOSE 108 (H) 03/28/2018   BUN 14 03/28/2018   CREATININE 0.79 03/28/2018   BILITOT 0.4 03/28/2018   ALKPHOS 65 03/28/2018   AST 18 03/28/2018   ALT 15 03/28/2018   PROT 7.0 03/28/2018   ALBUMIN 4.4 03/28/2018   CALCIUM 9.8 03/28/2018   GFR 81.82 03/28/2018   Lab Results  Component Value Date   CHOL 185 03/28/2018   Lab Results  Component Value Date   HDL 49.50 03/28/2018   Lab Results  Component Value Date   LDLCALC 112 (H) 03/28/2018   Lab Results  Component Value Date   TRIG 119.0 03/28/2018   Lab Results  Component Value Date   CHOLHDL 4 03/28/2018   Lab Results  Component Value Date   HGBA1C 6.3 03/28/2018       Assessment & Plan:   Problem List Items Addressed This Visit    Preventative health care - Primary    Patient encouraged to maintain heart healthy diet, regular exercise, adequate sleep. Consider daily probiotics. Take medications as prescribed. Labs ordered and reviewed. Given and reviewed copy of ACP documents from [employer] and encouraged to complete and return. Referred for screening colonoscopy      Headache   Relevant Medications   venlafaxine XR (EFFEXOR-XR) 75 MG 24 hr capsule   Other Relevant Orders   CBC (Completed)   Hypothyroid    On Levothyroxine, continue to monitor      Relevant Medications   levothyroxine (SYNTHROID, LEVOTHROID) 75 MCG tablet   Other Relevant Orders   TSH (Completed)   Depression with anxiety    Doing well on the Venlafaxine despite a very stressful year. She has just gone through a diagnosis of ganglioglioma diagnosis with her 71 year old  son. He has done well with his surgery and is being followed at [facility]. He is doing well at present      Relevant Medications   venlafaxine XR (EFFEXOR-XR) 75 MG 24 hr capsule   Hyperlipidemia, mixed    Encouraged heart healthy diet,  increase exercise, avoid trans fats, consider a krill oil cap daily      Relevant Orders   Lipid panel (Completed)   Type 2 diabetes mellitus without complication, without long-term current use of insulin (HCC)   Relevant Orders   Hemoglobin A1c (Completed)   Comprehensive metabolic panel (Completed)   Obesity    Encouraged DASH diet, decrease po intake and increase exercise as tolerated. Needs 7-8 hours of sleep nightly. Avoid trans fats, eat small, frequent meals every 4-5 hours with lean proteins, complex carbs and healthy fats. Minimize simple carbs      GERD (gastroesophageal reflux disease)    Switched from Ranitidine to Famotidine. Has not been well controlled recently so referred to GI for further consideration.       Relevant Medications   famotidine (PEPCID) 40 MG tablet   Other Relevant Orders   Ambulatory referral to Gastroenterology   Monoallelic mutation of BRIP1 gene    Patient epots he gynecologist Dr Orvan Seen has tested her and found her to be positive. They have since had her undergo a Breast MI and [name] and no concerning diagnosis.        Other Visit Diagnoses    Colon cancer screening       Relevant Orders   Ambulatory referral to Gastroenterology      I have discontinued Shirlee More. [facility]'s ranitidine. I am also having her start on famotidine. Additionally, I am having her maintain her glucose blood, ONETOUCH DELICA LANCETS 23J, diltiazem, levothyroxine, and venlafaxine XR.  Meds ordered this encounter  Medications  . levothyroxine (SYNTHROID, LEVOTHROID) 75 MCG tablet    Sig: TAKE 1 TABLET BY MOUTH DAILY BEFORE BREAKFAST.    Dispense:  90 tablet    Refill:  3  . venlafaxine XR (EFFEXOR-XR) 75 MG 24 hr capsule    Sig: TAKE ONE CAPSULE BY MOUTH EVERY DAY WITH BREAKFAST    Dispense:  60 capsule    Refill:  2  . famotidine (PEPCID) 40 MG tablet    Sig: Take 1 tablet (40 mg total) by mouth at bedtime.    Dispense:  30 tablet    Refill:  3     Penni Homans, MD

## 2018-03-30 NOTE — Assessment & Plan Note (Signed)
Encouraged heart healthy diet, increase exercise, avoid trans fats, consider a krill oil cap daily 

## 2018-03-30 NOTE — Assessment & Plan Note (Addendum)
Doing well on the Venlafaxine despite a very stressful year. She has just gone through a diagnosis of ganglioglioma diagnosis with her 50 year old son. He has done well with his surgery and is being followed at [facility]. He is doing well at present

## 2018-03-30 NOTE — Assessment & Plan Note (Addendum)
Patient encouraged to maintain heart healthy diet, regular exercise, adequate sleep. Consider daily probiotics. Take medications as prescribed. Labs ordered and reviewed. Given and reviewed copy of ACP documents from [employer] and encouraged to complete and return. Referred for screening colonoscopy

## 2018-03-30 NOTE — Assessment & Plan Note (Signed)
Encouraged DASH diet, decrease po intake and increase exercise as tolerated. Needs 7-8 hours of sleep nightly. Avoid trans fats, eat small, frequent meals every 4-5 hours with lean proteins, complex carbs and healthy fats. Minimize simple carbs 

## 2018-03-30 NOTE — Assessment & Plan Note (Signed)
On Levothyroxine, continue to monitor 

## 2018-04-23 ENCOUNTER — Telehealth: Payer: Self-pay | Admitting: Cardiology

## 2018-04-23 DIAGNOSIS — I471 Supraventricular tachycardia: Secondary | ICD-10-CM

## 2018-04-23 DIAGNOSIS — E119 Type 2 diabetes mellitus without complications: Secondary | ICD-10-CM | POA: Diagnosis not present

## 2018-04-23 LAB — HM DIABETES EYE EXAM

## 2018-04-23 MED ORDER — DILTIAZEM HCL ER COATED BEADS 300 MG PO CP24: 300.0000 mg | ORAL_CAPSULE | Freq: Every day | ORAL | 3 refills | Status: DC

## 2018-04-23 NOTE — Telephone Encounter (Signed)
Spoke to patient. She states she been having some episodes of heart beating  several beats then a thump and an  Pause an then a beat , per patient it is not like her  SVT. She states is is annoying . No other symptoms.   Appointment given patient unable to come 11/21,11/25. Appointment schedule for 12/2 at 1:20 pm  RN  asked if she was taking  Diltiazem 300 mg daily. Patient states she has ran out of  Medication for few weeks .   RN  informed patient will renew medication until office visit  Pick up medication today and start taking daily Patient verbalized understanding

## 2018-04-23 NOTE — Telephone Encounter (Signed)
New Message:    Patient calling she has svt  She would like to come see the dr. There is not any openings/ please call patient.

## 2018-04-24 ENCOUNTER — Emergency Department (HOSPITAL_BASED_OUTPATIENT_CLINIC_OR_DEPARTMENT_OTHER): Payer: 59

## 2018-04-24 ENCOUNTER — Emergency Department (HOSPITAL_BASED_OUTPATIENT_CLINIC_OR_DEPARTMENT_OTHER)
Admission: EM | Admit: 2018-04-24 | Discharge: 2018-04-25 | Disposition: A | Discharge status: Home / Self Care | Payer: 59 | Attending: Emergency Medicine | Admitting: Emergency Medicine

## 2018-04-24 ENCOUNTER — Other Ambulatory Visit: Payer: Self-pay

## 2018-04-24 ENCOUNTER — Telehealth: Payer: Self-pay | Admitting: *Deleted

## 2018-04-24 ENCOUNTER — Encounter (HOSPITAL_BASED_OUTPATIENT_CLINIC_OR_DEPARTMENT_OTHER): Payer: Self-pay | Admitting: Emergency Medicine

## 2018-04-24 DIAGNOSIS — Z79899 Other long term (current) drug therapy: Secondary | ICD-10-CM | POA: Insufficient documentation

## 2018-04-24 DIAGNOSIS — I10 Essential (primary) hypertension: Secondary | ICD-10-CM | POA: Diagnosis not present

## 2018-04-24 DIAGNOSIS — I471 Supraventricular tachycardia: Secondary | ICD-10-CM | POA: Diagnosis not present

## 2018-04-24 DIAGNOSIS — E785 Hyperlipidemia, unspecified: Secondary | ICD-10-CM | POA: Insufficient documentation

## 2018-04-24 DIAGNOSIS — R002 Palpitations: Secondary | ICD-10-CM | POA: Diagnosis not present

## 2018-04-24 DIAGNOSIS — E119 Type 2 diabetes mellitus without complications: Secondary | ICD-10-CM | POA: Insufficient documentation

## 2018-04-24 MED ORDER — ADENOSINE 6 MG/2ML IV SOLN
INTRAVENOUS | Status: AC
  Filled 2018-04-24: qty 6

## 2018-04-24 NOTE — Telephone Encounter (Signed)
Received Diabetic Eye Exam Report from [facility]; forwarded to provider/SLS 11/21

## 2018-04-24 NOTE — ED Triage Notes (Signed)
Pt c/o palpitations with a HR 150 since 20:00 tonight, pt c/o left back pain at this time. Hx of SVT.

## 2018-04-25 LAB — CBC
HCT: 45.4 % (ref 36.0–46.0)
HEMOGLOBIN: 14.8 g/dL (ref 12.0–15.0)
MCH: 27.1 pg (ref 26.0–34.0)
MCHC: 32.6 g/dL (ref 30.0–36.0)
MCV: 83.2 fL (ref 80.0–100.0)
NRBC: 0 % (ref 0.0–0.2)
Platelets: 346 10*3/uL (ref 150–400)
RBC: 5.46 MIL/uL — ABNORMAL HIGH (ref 3.87–5.11)
RDW: 13.1 % (ref 11.5–15.5)
WBC: 11.9 10*3/uL — AB (ref 4.0–10.5)

## 2018-04-25 MED ORDER — ADENOSINE 6 MG/2ML IV SOLN
6.0000 mg | Freq: Once | INTRAVENOUS | Status: AC
  Administered 2018-04-25: 6 mg via INTRAVENOUS

## 2018-04-25 NOTE — ED Notes (Signed)
Per Dr. Roxanne Mins, labs and xray are not necessary. Pt feels much better, resting with family at the bedside.

## 2018-04-25 NOTE — Discharge Instructions (Addendum)
Return if you have an episode that does not respond to ablation measures at home.

## 2018-04-25 NOTE — ED Provider Notes (Signed)
[name] Provider Note   CSN: [id number] Arrival date & time: 04/24/18  2328     History   Chief Complaint Chief Complaint  Patient presents with  . Palpitations    HPI Ebony [name] is a 52 y.o. female.  The history is provided by the patient.  Palpitations    She has a history of supraventricular tachycardia, and noted onset about 2 hours ago of rapid heartbeat.  She denies chest pain, nausea, vomiting, diaphoresis.  There is mild dyspnea.  She tried Valsalva maneuver without benefit.  She has seen a cardiologist in the past to evaluate this, but has never needed to come to the emergency department before.  She denies tobacco or ethanol use, does use moderate amount of caffeine.  Past Medical History:  Diagnosis Date  . Arthritis   . BCC (basal cell carcinoma), face    skin   . Concussion with no loss of consciousness 12/28/2014  . Depression with anxiety 10/06/2012  . Diabetes mellitus without complication ([place])   . GERD (gastroesophageal reflux disease) 09/05/2015  . Headache(784.0)    otc meds prn, migraine  . Hyperlipidemia, mixed 12/28/2014  . Hypertension    only with preg. 74yr ago 2002- no meds  . Hypothyroid 06/15/2012  . Obesity 09/05/2015  . SV[place] ([place]ricular tachycardia) ([name]   . Tinea pedis 12/28/2014    Patient Active Problem List   Diagnosis Date Noted  . Monoallelic mutation of BRIP1 gene 03/30/2018  . Ovarian cancer genetic susceptibility 10/28/2017  . Obesity 09/05/2015  . GERD (gastroesophageal reflux disease) 09/05/2015  . Type 2 diabetes mellitus without complication, without long-term current use of insulin ([name] 06/20/2015  . Hyperlipidemia, mixed 12/28/2014  . Pain of left calf 10/01/2014  . Hip pain, acute 05/27/2013  . [place] ([place]ricular [place]cardia) [place] 05/06/2013  . Palpitations 01/11/2013  . Right shoulder pain 01/08/2013  . Neck pain 01/08/2013  . Depression with anxiety 10/06/2012  .  Hypothyroid 06/15/2012  . Preventative health care 06/13/2012  . BCC (basal cell carcinoma), face   . Headache     Past Surgical History:  Procedure Laterality Date  . ABDOMINAL HYSTERECTOMY  03/04/2012   Procedure: HYSTERECTOMY ABDOMINAL;  Surgeon: GMarylynn Pearson MD;  [name];  Service: Gynecology;  Laterality: N/A;  with lysis of adhesions  . BASAL CELL CARCINOMA EXCISION Left    cheek/face  . CESAREAN SECTION  2002   HELLP Syndrome  . DILATION AND CURETTAGE OF UTERUS  2007   Miscarriage  . LAPAROSCOPIC TUBAL LIGATION  01/03/2011   Procedure: LAPAROSCOPIC TUBAL LIGATION;  Surgeon: GMarylynn Pearson  [name];  Service: Gynecology;  Laterality: Bilateral;  with filshie clips  . LAPAROSCOPY  03/04/2012   Procedure: LAPAROSCOPY DIAGNOSTIC;  Surgeon: GMarylynn Pearson MD;  [name];  Service: Gynecology;  Laterality: N/A;  with lysis of adhesions  . SHOULDER ARTHROSCOPY  2012  . TONSILLECTOMY       OB History   None      Home Medications    Prior to Admission medications   Medication Sig Start Date End Date Taking? Authorizing Provider  diltiazem (CARDIZEM CD) 300 MG 24 hr capsule Take 1 capsule (300 mg total) by mouth daily. 04/23/18   CLelon Perla MD  famotidine (PEPCID) 40 MG tablet Take 1 tablet (40 mg total) by mouth at bedtime. 03/28/18   BMosie Lukes MD  glucose blood (ONETOUCH VERIO) test strip Test once daily to check blood  sugar.  DX 11.9 07/08/15   Mosie Lukes, MD  levothyroxine (SYNTHROID, LEVOTHROID) 75 MCG tablet TAKE 1 TABLET BY MOUTH DAILY BEFORE BREAKFAST. 03/28/18   Mosie Lukes, MD  [facility] DELICA LANCETS 79Y MISC Test once daily to check blood sugar.  DX 11.9 07/08/15   Mosie Lukes, MD  venlafaxine XR (EFFEXOR-XR) 75 MG 24 hr capsule TAKE ONE CAPSULE BY MOUTH EVERY DAY WITH BREAKFAST 03/28/18   Mosie Lukes, MD    Family History Family History  Problem Relation Age of Onset  . Hypertension Mother   . Depression  Mother   . Breast cancer Mother 71  . Lung cancer Mother        lung (06-13-11)  . Basal cell carcinoma Brother 40  . Other Maternal Grandmother        spinal stenosis  . Emphysema Maternal Grandfather   . Prostate cancer Maternal Grandfather   . Breast cancer Cousin 61    Social History Social History   Tobacco Use  . Smoking status: Never Smoker  . Smokeless tobacco: Never Used  Substance Use Topics  . Alcohol use: Yes    Comment: occasionally   . Drug use: No     Allergies   Patient has no known allergies.   Review of Systems Review of Systems  Cardiovascular: Positive for palpitations.  All other systems reviewed and are negative.    Physical Exam Updated Vital Signs BP (!) 133/117 (BP Location: Right Arm)   Pulse (!) 169   Temp 97.6 F (36.4 C) (Oral)   Resp 19   Ht _0  (1.651 m)   Wt 108.9 kg   LMP 12/15/2010   SpO2 96%   BMI 39.94 kg/m   Physical Exam  Nursing note and vitals reviewed.  52 year old female, resting comfortably and in no acute distress. Vital signs are significant for rapid heart rate and elevated diastolic blood pressure. Oxygen saturation is 96%, which is normal. Head is normocephalic and atraumatic. PERRLA, EOMI. Oropharynx is clear. Neck is nontender and supple without adenopathy or JVD. Back is nontender and there is no CVA tenderness. Lungs are clear without rales, wheezes, or rhonchi. Chest is nontender. Heart is tachycardic without murmur. Abdomen is soft, flat, nontender without masses or hepatosplenomegaly and peristalsis is normoactive. Extremities have no cyanosis or edema, full range of motion is present. Skin is warm and dry without rash. Neurologic: Mental status is normal, cranial nerves are intact, there are no motor or sensory deficits.  ED Treatments / Results  Labs (all labs ordered are listed, but only abnormal results are displayed) Labs Reviewed  CBC - Abnormal; Notable for the following components:       Result Value   WBC 11.9 (*)    RBC 5.46 (*)    All other components within normal limits  BASIC METABOLIC PANEL  TROPONIN I  PREGNANCY, URINE    EKG EKG Interpretation  Date/Time:  Thursday April 24 2018 23:37:52 EST Ventricular Rate:  164 PR Interval:    QRS Duration: 86 QT Interval:  288 QTC Calculation: 475 R Axis:   77 Text Interpretation:  Supraventricular tachycardia Nonspecific ST abnormality Abnormal QRS-T angle, consider primary T wave abnormality Abnormal ECG ST abnormality likely rate-related When compared with ECG of 02/26/2012, Supraventricular tachycardia has replaced Sinus bradycardia Nonspecific ST abnormality is now present - likely rate-related Confirmed by Delora Fuel ([id number]) on 04/25/2018 12:05:53 AM   EKG Interpretation  Date/Time:  Friday April 25 2018 00:02:48 EST Ventricular Rate:  102 PR Interval:    QRS Duration: 97 QT Interval:  345 QTC Calculation: 450 R Axis:   75 Text Interpretation:  Sinus tachycardia Probable left atrial enlargement When compared with ECG of 04/24/2018, Sinus rhythm has replaced Supraventricular tachycardia Nonspecific ST abnormality is no longer present , presumably was rate-related Confirmed by Delora Fuel ([id number]) on 04/25/2018 12:12:41 AM       Procedures .Cardioversion Date/Time: 04/25/2018 12:07 AM Performed by: Delora Fuel, MD Authorized by: Delora Fuel, MD   Consent:    Consent obtained:  Verbal   Consent given by:  Patient   Risks discussed:  Induced arrhythmia, death and pain   Alternatives discussed:  Rate-control medication Pre-procedure details:    Cardioversion basis:  Emergent   Rhythm:  Supraventricular tachycardia   Electrode placement:  Anterior-posterior Patient sedated: No Attempt one:    Cardioversion mode attempt one: Adenosine 6 mg IVP.   Shock outcome:  Conversion to normal sinus rhythm Post-procedure details:    Patient status:  Awake   Patient tolerance of procedure:  Tolerated  well, no immediate complications Comments:     Supraventricular tachycardia successfully converted with single dose of adenosine.       Medications Ordered in ED Medications  adenosine (ADENOCARD) 6 MG/2ML injection (has no administration in time range)  adenosine (ADENOCARD) 6 MG/2ML injection 6 mg (6 mg Intravenous Given 04/25/18 0002)     Initial Impression / Assessment and Plan / ED Course  I have reviewed the triage vital signs and the nursing notes.  Paroxysmal supraventricular tachycardia.  Carotid sinus massage was attempted without any benefit.  She was given a dose of adenosine with successful conversion to sinus rhythm.  Old records are reviewed, confirming outpatient evaluation by cardiology, but no ED visits for SVT prior to today.  She was observed in the emergency department with no recurrence of tachyarrhythmia.  She is discharged with instructions to follow-up with her PCP and with her cardiologist.  Return precautions discussed.  Final Clinical Impressions(s) / ED Diagnoses   Final diagnoses:  SVT (supraventricular tachycardia) [facility])    ED Discharge Orders    None       Delora Fuel, MD 11/73/56 [phone]

## 2018-04-25 NOTE — ED Notes (Signed)
Pt verbalizes understanding of d/c instructions and denies any further needs at this time. 

## 2018-04-25 NOTE — ED Notes (Signed)
Pt arrives with c/o SVT for the last four hours. She has a long-standing hx of SVT and tried vagal maneuvers prior to arrival without success. Pt denies chest pain, c/o SOB, but states it is normal when she is in SVT for her to be SOB. Pt speaking in complete sentences, VSS. She is placed on the monitor, IV start in progress.

## 2018-05-05 ENCOUNTER — Ambulatory Visit: Payer: 59 | Admitting: Cardiology

## 2018-05-05 ENCOUNTER — Encounter: Payer: Self-pay | Admitting: Cardiology

## 2018-05-05 VITALS — BP 126/78 | HR 73 | Ht 65.0 in | Wt 228.0 lb

## 2018-05-05 DIAGNOSIS — I471 Supraventricular tachycardia: Secondary | ICD-10-CM

## 2018-05-05 NOTE — Progress Notes (Signed)
HPI: FU SVT. She also apparently has been told she had a bicuspid aortic valve in the past. Echo in August of 2014 showed normal LV function, grade 2 diastolic dysfunction; mild LAE. Monitor in Oct of 2014 showed intermittent SVT. Toprol added but developed fatigue; changed to cardizem.  Patient seen in November with recurrent SVT.  She was given adenosine and converted to sinus rhythm.  Since last seen,  she has had multiple episodes of SVT that are worse in frequency.  They are described as heart racing with associated dyspnea.  No chest pain or syncope.  She otherwise denies dyspnea on exertion, orthopnea, PND, pedal edema or exertional chest pain.  Current Outpatient Medications  Medication Sig Dispense Refill  . diltiazem (CARDIZEM CD) 300 MG 24 hr capsule Take 1 capsule (300 mg total) by mouth daily. 30 capsule 3  . famotidine (PEPCID) 40 MG tablet Take 1 tablet (40 mg total) by mouth at bedtime. 30 tablet 3  . levothyroxine (SYNTHROID, LEVOTHROID) 75 MCG tablet TAKE 1 TABLET BY MOUTH DAILY BEFORE BREAKFAST. 90 tablet 3  . venlafaxine XR (EFFEXOR-XR) 75 MG 24 hr capsule TAKE ONE CAPSULE BY MOUTH EVERY DAY WITH BREAKFAST 60 capsule 2   No current facility-administered medications for this visit.      Past Medical History:  Diagnosis Date  . Arthritis   . BCC (basal cell carcinoma), face    skin   . Concussion with no loss of consciousness 12/28/2014  . Depression with anxiety 10/06/2012  . Diabetes mellitus without complication ([place])   . GERD (gastroesophageal reflux disease) 09/05/2015  . Headache(784.0)    otc meds prn, migraine  . Hyperlipidemia, mixed 12/28/2014  . Hypertension    only with preg. 75yrs ago 2002- no meds  . Hypothyroid 06/15/2012  . Obesity 09/05/2015  . SVT (supraventricular tachycardia) ([place])   . Tinea pedis 12/28/2014    Past Surgical History:  Procedure Laterality Date  . ABDOMINAL HYSTERECTOMY  03/04/2012   Procedure: HYSTERECTOMY ABDOMINAL;  Surgeon:  Marylynn Pearson, MD;  Location: [facility];  Service: Gynecology;  Laterality: N/A;  with lysis of adhesions  . BASAL CELL CARCINOMA EXCISION Left    cheek/face  . CESAREAN SECTION  2002   HELLP Syndrome  . DILATION AND CURETTAGE OF UTERUS  2007   Miscarriage  . LAPAROSCOPIC TUBAL LIGATION  01/03/2011   Procedure: LAPAROSCOPIC TUBAL LIGATION;  Surgeon: Marylynn Pearson;  Location: [facility];  Service: Gynecology;  Laterality: Bilateral;  with filshie clips  . LAPAROSCOPY  03/04/2012   Procedure: LAPAROSCOPY DIAGNOSTIC;  Surgeon: Marylynn Pearson, MD;  Location: [facility];  Service: Gynecology;  Laterality: N/A;  with lysis of adhesions  . SHOULDER ARTHROSCOPY  2012  . TONSILLECTOMY      Social History   Socioeconomic History  . Marital status: Married    Spouse name: Not on file  . Number of children: 1  . Years of education: Not on file  . 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 Financial resource strain: Not on file  . Food insecurity:    Worry: Not on file    Inability: Not on file  . Transportation needs:    Medical: Not on file    Non-medical: Not on file  Tobacco Use  . Smoking status: Never Smoker  . Smokeless tobacco: Never Used  Substance and Sexual Activity  . Alcohol use: Yes  Comment: occasionally   . Drug use: No  . Sexual activity: Yes    Birth control/protection: Surgical  Lifestyle  . Physical activity:    Days per week: Not on file    Minutes per session: Not on file  . Stress: Not on file  Relationships  . Social connections:    Talks on phone: Not on file    Gets together: Not on file    Attends religious service: Not on file    Active member of club or organization: Not on file    Attends meetings of clubs or organizations: Not on file    Relationship status: Not on file  . Intimate partner violence:    Fear of current or ex partner: Not on file    Emotionally abused: Not on file    Physically  abused: Not on file    Forced sexual activity: Not on file  Other Topics Concern  . Not on file  Social History Narrative  . Not on file    Family History  Problem Relation Age of Onset  . Hypertension Mother   . Depression Mother   . Breast cancer Mother 63  . Lung cancer Mother        lung (06-13-11)  . Basal cell carcinoma Brother 40  . Other Maternal Grandmother        spinal stenosis  . Emphysema Maternal Grandfather   . Prostate cancer Maternal Grandfather   . Breast cancer Cousin 40    ROS: no fevers or chills, productive cough, hemoptysis, dysphasia, odynophagia, melena, hematochezia, dysuria, hematuria, rash, seizure activity, orthopnea, PND, pedal edema, claudication. Remaining systems are negative.  Physical Exam: Well-developed well-nourished in no acute distress.  Skin is warm and dry.  HEENT is normal.  Neck is supple.  Chest is clear to auscultation with normal expansion.  Cardiovascular exam is regular rate and rhythm.  Abdominal exam nontender or distended. No masses palpated. Extremities show no edema. neuro grossly intact  ECG-sinus rhythm at a rate of 73.  No ST changes.  Personally reviewed  A/P  1 supraventricular tachycardia-we will continue with Cardizem.  She did not tolerate beta-blockade in the past.  Recent episode converted with adenosine.  She has had increasing frequency of SVT this past year.  She would now be agreeable to ablation which I think would be appropriate.  We will refer to electrophysiology for further evaluation.  2 question bicuspid aortic valve-we will repeat echocardiogram.  3 hypertension-blood pressure is controlled.  Continue present medications.  Kirk Ruths, MD

## 2018-05-05 NOTE — Patient Instructions (Signed)
Medication Instructions:  NO CHANGE If you need a refill on your cardiac medications before your next appointment, please call your pharmacy.   Lab work: If you have labs (blood work) drawn today and your tests are completely normal, you will receive your results only by: Marland Kitchen MyChart Message (if you have MyChart) OR . A paper copy in the mail If you have any lab test that is abnormal or we need to change your treatment, we will call you to review the results.  Testing/Procedures: Your physician has requested that you have an echocardiogram. Echocardiography is a painless test that uses sound waves to create images of your heart. It provides your doctor with information about the size and shape of your heart and how well your heart's chambers and valves are working. This procedure takes approximately one hour. There are no restrictions for this procedure.    Follow-Up: Your physician recommends that you schedule a follow-up appointment in: [place]

## 2018-05-08 ENCOUNTER — Encounter: Payer: Self-pay | Admitting: Gastroenterology

## 2018-05-09 ENCOUNTER — Encounter: Payer: Self-pay | Admitting: Family Medicine

## 2018-05-14 ENCOUNTER — Encounter: Payer: Self-pay | Admitting: Gastroenterology

## 2018-05-14 ENCOUNTER — Ambulatory Visit (HOSPITAL_COMMUNITY): Payer: 59 | Attending: Cardiology

## 2018-05-14 ENCOUNTER — Other Ambulatory Visit: Payer: Self-pay

## 2018-05-14 ENCOUNTER — Ambulatory Visit: Payer: 59 | Admitting: Gastroenterology

## 2018-05-14 VITALS — BP 128/84 | HR 82 | Ht 65.0 in | Wt 229.5 lb

## 2018-05-14 DIAGNOSIS — K219 Gastro-esophageal reflux disease without esophagitis: Secondary | ICD-10-CM

## 2018-05-14 DIAGNOSIS — I471 Supraventricular tachycardia: Secondary | ICD-10-CM

## 2018-05-14 DIAGNOSIS — Z1212 Encounter for screening for malignant neoplasm of rectum: Secondary | ICD-10-CM | POA: Diagnosis not present

## 2018-05-14 DIAGNOSIS — Z1211 Encounter for screening for malignant neoplasm of colon: Secondary | ICD-10-CM | POA: Diagnosis not present

## 2018-05-14 NOTE — Progress Notes (Signed)
Chief Complaint: CRC screening, reflux   Referring Provider:     Mosie Lukes, MD    HPI:     Ebony [name] is a 52 y.o. female with a history of hypertension, hypothyroidism, obesity, and hyperlipidemia presenting to the Gastroenterology Clinic for initial CRC screening. No family history of CRC or related malignancies, and patient is without any active lower GI sxs. Denies any melena, hematochezia, nausea, vomiting, diarrhea, constipation, change in bowel habits, early satiety, or abdominal pain, and no fever, chills, night sweats or weight loss. No previous CRC screening to date.  Additionally, she has a longstanding history of reflux.  She was recently switched from ranitidine to famotidine due to published carcinogen risks.  However, symptoms still not well controlled and referred to GI for further evaluation.  She states she has index sxs of HB and regurgitation. Was initially well controlled with Zantac but started to have breakthrough symptoms.  Symptoms responsive to Pepcid but with increasing breakthrough sxs lately to include nocturnal regurgitation, water brash, nonproductive cough. No dysphagia. GERD-HRQL Questionnaire score: 24  Of note, she was recently evaluated in the Cardiology clinic for SVT treated with adenosine with conversion to sinus rhythm in the ER in November.  Currently maintained with Cardizem with referral to electrophysiology for consideration of ablation.  Appointment pending next week.   Past Medical History:  Diagnosis Date  . Arthritis   . BCC (basal cell carcinoma), face    skin   . Concussion with no loss of consciousness 12/28/2014  . Depression with anxiety 10/06/2012  . Diabetes mellitus without complication ([place])   . GERD (gastroesophageal reflux disease) 09/05/2015  . Headache(784.0)    otc meds prn, migraine  . Hyperlipidemia, mixed 12/28/2014  . Hypertension    only with preg. 73yr ago 2002- no meds  . Hypothyroid 06/15/2012   . Obesity 09/05/2015  . SVT (supraventricular tachycardia) ([place]   . Tinea pedis 12/28/2014     Past Surgical History:  Procedure Laterality Date  . ABDOMINAL HYSTERECTOMY  03/04/2012   Procedure: HYSTERECTOMY ABDOMINAL;  Surgeon: GMarylynn Pearson MD;  Location: [facility];  Service: Gynecology;  Laterality: N/A;  with lysis of adhesions  . BASAL CELL CARCINOMA EXCISION Left    cheek/face  . CESAREAN SECTION  2002   HELLP Syndrome  . DILATION AND CURETTAGE OF UTERUS  2007   Miscarriage  . LAPAROSCOPIC TUBAL LIGATION  01/03/2011   Procedure: LAPAROSCOPIC TUBAL LIGATION;  Surgeon: GMarylynn Pearson  [facility];  Service: Gynecology;  Laterality: Bilateral;  with filshie clips  . LAPAROSCOPY  03/04/2012   Procedure: LAPAROSCOPY DIAGNOSTIC;  Surgeon: GMarylynn Pearson MD;  Location[facility];  Service: Gynecology;  Laterality: N/A;  with lysis of adhesions  . SHOULDER ARTHROSCOPY  2012  . TONSILLECTOMY     Family History  Problem Relation Age of Onset  . Hypertension Mother   . Depression Mother   . Breast cancer Mother 564 . Lung cancer Mother        lung (06-13-11)  . Basal cell carcinoma Brother 40  . Other Maternal Grandmother        spinal stenosis  . Emphysema Maternal Grandfather   . Prostate cancer Maternal Grandfather   . Breast cancer Cousin 465 . Colon cancer Neg Hx   . Esophageal cancer Neg Hx    Social History   Tobacco Use  . Smoking status: Never Smoker  .  Smokeless tobacco: Never Used  Substance Use Topics  . Alcohol use: Yes    Comment: occasionally   . Drug use: No   Current Outpatient Medications  Medication Sig Dispense Refill  . diltiazem (CARDIZEM CD) 300 MG 24 hr capsule Take 1 capsule (300 mg total) by mouth daily. 30 capsule 3  . famotidine (PEPCID) 40 MG tablet Take 1 tablet (40 mg total) by mouth at bedtime. 30 tablet 3  . levothyroxine (SYNTHROID, LEVOTHROID) 75 MCG tablet TAKE 1 TABLET BY MOUTH DAILY BEFORE BREAKFAST. 90 tablet 3  . venlafaxine XR  (EFFEXOR-XR) 75 MG 24 hr capsule TAKE ONE CAPSULE BY MOUTH EVERY DAY WITH BREAKFAST 60 capsule 2   No current facility-administered medications for this visit.    No Known Allergies   Review of Systems: All systems reviewed and negative except where noted in HPI.     Physical Exam:    Wt Readings from Last 3 Encounters:  05/14/18 229 lb 8 oz (104.1 kg)  05/05/18 228 lb (103.4 kg)  04/24/18 240 lb (108.9 kg)    Ht '5\' 5"'  (1.651 m)   Wt 229 lb 8 oz (104.1 kg)   LMP 12/15/2010   BMI 38.19 kg/m  Constitutional:  Pleasant, in no acute distress. Psychiatric: Normal mood and affect. Behavior is normal. EENT: Pupils normal.  Conjunctivae are normal. No scleral icterus. Neck supple. No cervical LAD. Cardiovascular: Normal rate, regular rhythm. No edema Pulmonary/chest: Effort normal and breath sounds normal. No wheezing, rales or rhonchi. Abdominal: Soft, nondistended, nontender. Bowel sounds active throughout. There are no masses palpable. No hepatomegaly. Neurological: Alert and oriented to person place and time. Skin: Skin is warm and dry. No rashes noted.   ASSESSMENT AND PLAN;   Ebony [name] is a 52 y.o. female presenting with:  1) CRC screening: No family history of CRC or related malignancies, and patient is without any active lower GI sxs. No previous CRC screening to date. Discussed options for CRC screening, to include optical vs virtual colonoscopy - the risks and benefits and pros and cons of each, as well as discussion of FIT kit testing, Cologuard, etc, and the patient decided to proceed with an optical colonoscopy.   - Will schedule date and time for colonoscopy prior to leaving clinic today  - NPO at [facility] prior to procedure  - Bowel prep ordered with plan for instruction with GI clinical staff - All questions answered  2) GERD: Longstanding history of reflux with recent increasing breakthrough symptoms despite ongoing H2 RA.  Has not trialed a PPI in the  past.  Discussed reflux physiology at length to include risks of uncontrolled reflux and will proceed as below:  - EGD to evaluate for erosive esophagitis, LES laxity, hiatal hernia, Barrett's screening, etc. - Discussed continuing H2RA versus changing to PPI at this time and she would prefer the former with endoscopic evaluation prior to PPI initiation.  Given degree of symptom severity and ability to expedite this procedure, this seems reasonable - Can evaluate for anatomic disruption with possibility for antireflux procedure at time of EGD.  Of note, current BMI 38 which precludes TIF.   3) SVT: History of SVT with recent adenosine administration in the ER.  Currently rate controlled.  Scheduled for appointment with Electrophysiologist next week.  Will discuss with her primary Cardiologist, Dr. Stanford Breed, regarding bowel preparation for colonoscopy in the setting of recent SVT and pending EP appointment.    The indications, risks, and benefits of EGD and  colonoscopy were explained to the patient in detail. Risks include but are not limited to bleeding, perforation, adverse reaction to medications, and cardiopulmonary compromise. Sequelae include but are not limited to the possibility of surgery, hospitalization, and mortality. The patient verbalized understanding and wished to proceed. All questions answered, referred for scheduling and bowel prep ordered. Further recommendations pending results of the exam.      Lavena Bullion, DO, FACG  05/14/2018, 2:24 PM   Mosie Lukes, MD

## 2018-05-14 NOTE — Patient Instructions (Addendum)
If you are age 50 or older, your body mass index should be between 23-30. Your Body mass index is 38.19 kg/m. If this is out of the aforementioned range listed, please consider follow up with your Primary Care Provider.  If you are age 37 or younger, your body mass index should be between 19-25. Your Body mass index is 38.19 kg/m. If this is out of the aformentioned range listed, please consider follow up with your Primary Care Provider.   Call us at [phone] when you are ready to schedule your EGD/ Colonoscopy.  It was a pleasure to see you today!  Vito Cirigliano, D.O.

## 2018-05-16 ENCOUNTER — Encounter: Payer: 59 | Admitting: Gastroenterology

## 2018-05-21 ENCOUNTER — Ambulatory Visit: Payer: 59 | Admitting: Internal Medicine

## 2018-05-21 ENCOUNTER — Encounter: Payer: Self-pay | Admitting: Internal Medicine

## 2018-05-21 VITALS — BP 126/64 | HR 71 | Ht 65.0 in | Wt 220.0 lb

## 2018-05-21 DIAGNOSIS — I471 Supraventricular tachycardia: Secondary | ICD-10-CM

## 2018-05-21 NOTE — Patient Instructions (Addendum)
Medication Instructions:  Your physician recommends that you continue on your current medications as directed. Please refer to the Current Medication list given to you today.  Labwork: None ordered.  Testing/Procedures: Your physician has recommended that you have an ablation. Catheter ablation is a medical procedure used to treat some cardiac arrhythmias (irregular heartbeats). During catheter ablation, a long, thin, flexible tube is put into a blood vessel in your groin (upper thigh), or neck. This tube is called an ablation catheter. It is then guided to your heart through the blood vessel. Radio frequency waves destroy small areas of heart tissue where abnormal heartbeats may cause an arrhythmia to start. Please see the instruction sheet given to you today.  Follow-Up:  The following days are available for procedures:  December 31 January 3, 6, 9, 16, 17, 20, 21, 29, 31 February 3, 5, 10, 13, 17, 20, 24, 26  If you decide on a day please give me a call:  Ebony [name] [phone]     Cardiac Ablation Cardiac ablation is a procedure to disable (ablate) a small amount of heart tissue in very specific places. The heart has many electrical connections. Sometimes these connections are abnormal and can cause the heart to beat very fast or irregularly. Ablating some of the problem areas can improve the heart rhythm or return it to normal. Ablation may be done for people who:  Have Wolff-Parkinson-White syndrome.  Have fast heart rhythms (tachycardia).  Have taken medicines for an abnormal heart rhythm (arrhythmia) that were not effective or caused side effects.  Have a high-risk heartbeat that may be life-threatening. During the procedure, a small incision is made in the neck or the groin, and a long, thin, flexible tube (catheter) is inserted into the incision and moved to the heart. Small devices (electrodes) on the tip of the catheter will send out electrical currents. A type of X-ray  (fluoroscopy) will be used to help guide the catheter and to provide images of the heart. Tell a health care provider about:  Any allergies you have.  All medicines you are taking, including vitamins, herbs, eye drops, creams, and over-the-counter medicines.  Any problems you or family members have had with anesthetic medicines.  Any blood disorders you have.  Any surgeries you have had.  Any medical conditions you have, such as kidney failure.  Whether you are pregnant or may be pregnant. What are the risks? Generally, this is a safe procedure. However, problems may occur, including:  Infection.  Bruising and bleeding at the catheter insertion site.  Bleeding into the chest, especially into the sac that surrounds the heart. This is a serious complication.  Stroke or blood clots.  Damage to other structures or organs.  Allergic reaction to medicines or dyes.  Need for a permanent pacemaker if the normal electrical system is damaged. A pacemaker is a small computer that sends electrical signals to the heart and helps your heart beat normally.  The procedure not being fully effective. This may not be recognized until months later. Repeat ablation procedures are sometimes required. What happens before the procedure?  Follow instructions from your health care provider about eating or drinking restrictions.  Ask your health care provider about: ? Changing or stopping your regular medicines. This is especially important if you are taking diabetes medicines or blood thinners. ? Taking medicines such as aspirin and ibuprofen. These medicines can thin your blood. Do not take these medicines before your procedure if your health care provider instructs you  not to.  Plan to have someone take you home from the hospital or clinic.  If you will be going home right after the procedure, plan to have someone with you for 24 hours. What happens during the procedure?  To lower your risk of  infection: ? Your health care team will wash or sanitize their hands. ? Your skin will be washed with soap. ? Hair may be removed from the incision area.  An IV tube will be inserted into one of your veins.  You will be given a medicine to help you relax (sedative).  The skin on your neck or groin will be numbed.  An incision will be made in your neck or your groin.  A needle will be inserted through the incision and into a large vein in your neck or groin.  A catheter will be inserted into the needle and moved to your heart.  Dye may be injected through the catheter to help your surgeon see the area of the heart that needs treatment.  Electrical currents will be sent from the catheter to ablate heart tissue in desired areas. There are three types of energy that may be used to ablate heart tissue: ? Heat (radiofrequency energy). ? Laser energy. ? Extreme cold (cryoablation).  When the necessary tissue has been ablated, the catheter will be removed.  Pressure will be held on the catheter insertion area to prevent excessive bleeding.  A bandage (dressing) will be placed over the catheter insertion area. The procedure may vary among health care providers and hospitals. What happens after the procedure?  Your blood pressure, heart rate, breathing rate, and blood oxygen level will be monitored until the medicines you were given have worn off.  Your catheter insertion area will be monitored for bleeding. You will need to lie still for a few hours to ensure that you do not bleed from the catheter insertion area.  Do not drive for 24 hours or as long as directed by your health care provider. Summary  Cardiac ablation is a procedure to disable (ablate) a small amount of heart tissue in very specific places. Ablating some of the problem areas can improve the heart rhythm or return it to normal.  During the procedure, electrical currents will be sent from the catheter to ablate heart  tissue in desired areas. This information is not intended to replace advice given to you by your health care provider. Make sure you discuss any questions you have with your health care provider. Document Released: 10/07/2008 Document Revised: 04/09/2016 Document Reviewed: 04/09/2016 Elsevier Interactive Patient Education  2019 [employer].

## 2018-05-21 NOTE — Progress Notes (Signed)
HPI Ebony [name] is referred today by Dr. Stanford Breed for evaluation of SVT. She is a pleasant 52 yo woman who has had problems with heart racing since she was 33. Initially the episodes were well controlled. She has had gradual worsening in her symptoms with increased frequency and duration. She has had a documented narrow QRS tachy at rates of 165/min, terminated with IV adenosine. She has never had syncope but notes sob and dizziness when she goes into SVT. Typically these episodes last about 20 minutes but can go over and hour. She has been on escalating doses of cardizem which has helped but not controlled her symptoms. No Known Allergies   Current Outpatient Medications  Medication Sig Dispense Refill  . diltiazem (CARDIZEM CD) 300 MG 24 hr capsule Take 1 capsule (300 mg total) by mouth daily. 30 capsule 3  . famotidine (PEPCID) 40 MG tablet Take 1 tablet (40 mg total) by mouth at bedtime. 30 tablet 3  . levothyroxine (SYNTHROID, LEVOTHROID) 75 MCG tablet TAKE 1 TABLET BY MOUTH DAILY BEFORE BREAKFAST. 90 tablet 3  . venlafaxine XR (EFFEXOR-XR) 75 MG 24 hr capsule TAKE ONE CAPSULE BY MOUTH EVERY DAY WITH BREAKFAST 60 capsule 2   No current facility-administered medications for this visit.      Past Medical History:  Diagnosis Date  . Arthritis   . BCC (basal cell carcinoma), face    skin   . Concussion with no loss of consciousness 12/28/2014  . Depression with anxiety 10/06/2012  . Diabetes mellitus without complication ([place])   . GERD (gastroesophageal reflux disease) 09/05/2015  . Headache(784.0)    otc meds prn, migraine  . Hyperlipidemia, mixed 12/28/2014  . Hypertension    only with preg. 58yrs ago 2002- no meds  . Hypothyroid 06/15/2012  . Obesity 09/05/2015  . SVT (supraventricular tachycardia) ([place])   . Tinea pedis 12/28/2014    ROS:   All systems reviewed and negative except as noted in the HPI.   Past Surgical History:  Procedure Laterality Date  . ABDOMINAL  HYSTERECTOMY  03/04/2012   Procedure: HYSTERECTOMY ABDOMINAL;  Surgeon: Marylynn Pearson, MD;  Location: [facility];  Service: Gynecology;  Laterality: N/A;  with lysis of adhesions  . BASAL CELL CARCINOMA EXCISION Left    cheek/face  . CESAREAN SECTION  2002   HELLP Syndrome  . DILATION AND CURETTAGE OF UTERUS  2007   Miscarriage  . LAPAROSCOPIC TUBAL LIGATION  01/03/2011   Procedure: LAPAROSCOPIC TUBAL LIGATION;  Surgeon: Marylynn Pearson;  Location: [facility];  Service: Gynecology;  Laterality: Bilateral;  with filshie clips  . LAPAROSCOPY  03/04/2012   Procedure: LAPAROSCOPY DIAGNOSTIC;  Surgeon: Marylynn Pearson, MD;  Location: [facility];  Service: Gynecology;  Laterality: N/A;  with lysis of adhesions  . SHOULDER ARTHROSCOPY  2012  . TONSILLECTOMY       Family History  Problem Relation Age of Onset  . Hypertension Mother   . Depression Mother   . Breast cancer Mother 32  . Lung cancer Mother        lung (06-13-11)  . Basal cell carcinoma Brother 40  . Other Maternal Grandmother        spinal stenosis  . Emphysema Maternal Grandfather   . Prostate cancer Maternal Grandfather   . Breast cancer Cousin 42  . Colon cancer Neg Hx   . Esophageal cancer Neg Hx      Social History   Socioeconomic History  . Marital status: Married  Spouse name: Not on file  . Number of children: 1  . Years of education: Not on file  . 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 Financial resource strain: Not on file  . Food insecurity:    Worry: Not on file    Inability: Not on file  . Transportation needs:    Medical: Not on file    Non-medical: Not on file  Tobacco Use  . Smoking status: Never Smoker  . Smokeless tobacco: Never Used  Substance and Sexual Activity  . Alcohol use: Yes    Comment: occasionally   . Drug use: No  . Sexual activity: Yes    Birth control/protection: Surgical  Lifestyle  . Physical activity:    Days per  week: Not on file    Minutes per session: Not on file  . Stress: Not on file  Relationships  . Social connections:    Talks on phone: Not on file    Gets together: Not on file    Attends religious service: Not on file    Active member of club or organization: Not on file    Attends meetings of clubs or organizations: Not on file    Relationship status: Not on file  . Intimate partner violence:    Fear of current or ex partner: Not on file    Emotionally abused: Not on file    Physically abused: Not on file    Forced sexual activity: Not on file  Other Topics Concern  . Not on file  Social History Narrative  . Not on file     BP 126/64   Pulse 71   Ht 5\' 5"  (1.651 m)   Wt 220 lb (99.8 kg)   LMP 12/15/2010   SpO2 98%   BMI 36.61 kg/m   Physical Exam:  Well appearing NAD HEENT: Unremarkable Neck:  No JVD, no thyromegally Lymphatics:  No adenopathy Back:  No CVA tenderness Lungs:  Clear with no wheezes HEART:  Regular rate rhythm, no murmurs, no rubs, no clicks Abd:  soft, positive bowel sounds, no organomegally, no rebound, no guarding Ext:  2 plus pulses, no edema, no cyanosis, no clubbing Skin:  No rashes no nodules Neuro:  CN II through XII intact, motor grossly intact  EKG - reviewed - nsr with no pre-excitation  DEVICE  Normal device function.  See PaceArt for details.   Assess/Plan: 1. SVT - I have discussed the treatment options with the patient and the risks/benefits/goals/expectations of EPS/RFA of SVT ablation were discussed and she wishes to proceed. 2. HTN - her blood pressure is controlled. We will follow.  Mikle Bosworth.D.

## 2018-06-11 ENCOUNTER — Other Ambulatory Visit: Payer: Self-pay | Admitting: Family Medicine

## 2018-07-14 DIAGNOSIS — H9202 Otalgia, left ear: Secondary | ICD-10-CM | POA: Diagnosis not present

## 2018-08-21 ENCOUNTER — Other Ambulatory Visit: Payer: Self-pay | Admitting: *Deleted

## 2018-08-21 DIAGNOSIS — I471 Supraventricular tachycardia: Secondary | ICD-10-CM

## 2018-08-21 MED ORDER — DILTIAZEM HCL ER COATED BEADS 300 MG PO CP24: 300.0000 mg | ORAL_CAPSULE | Freq: Every day | ORAL | 5 refills | Status: DC

## 2018-09-25 ENCOUNTER — Encounter: Payer: 59 | Admitting: Family Medicine

## 2018-10-20 ENCOUNTER — Other Ambulatory Visit: Payer: Self-pay

## 2018-10-20 ENCOUNTER — Ambulatory Visit: Payer: 59

## 2018-10-20 VITALS — Ht 65.0 in | Wt 225.0 lb

## 2018-10-20 DIAGNOSIS — K219 Gastro-esophageal reflux disease without esophagitis: Secondary | ICD-10-CM

## 2018-10-20 DIAGNOSIS — Z1211 Encounter for screening for malignant neoplasm of colon: Secondary | ICD-10-CM

## 2018-10-20 MED ORDER — NA SULFATE-K SULFATE-MG SULF 17.5-3.13-1.6 GM/177ML PO SOLN: 1.0000 | Freq: Once | ORAL | 0 refills | Status: AC

## 2018-10-20 NOTE — Progress Notes (Signed)
Per pt, no allergies to soy or egg products.Pt not taking any weight loss meds or using  O2 at home.  Emmi video information will be mailed to the pt.  The PV was done over the phone due to COVID-19. I verified the pt's address and insurance. I reviewed prep instructions with the pt and will mail the paperwork to the pt today. The pt was advised to call our office if she has questions or any changes prior to her procedure. Pt understood.

## 2018-10-28 ENCOUNTER — Encounter: Payer: Self-pay | Admitting: Gastroenterology

## 2018-11-03 ENCOUNTER — Telehealth: Payer: Self-pay | Admitting: Internal Medicine

## 2018-11-03 NOTE — Telephone Encounter (Signed)
Returned call to Pt.  Advised I would discuss with Dr. Lovena Le and see if he would advise a virtual visit prior to setting up SVT ablation.  Pt indicates understanding.  Thanked nurse for return call.

## 2018-11-03 NOTE — Telephone Encounter (Signed)
Patient had an appointment with Dr. Lovena Le in December 2019, and she talked with Dr. Lovena Le about scheduling an ablation. She wanted to know if that was still a procedure Dr. Lovena Le wanted her to have, and when she could possibly get that scheduled.

## 2018-11-07 ENCOUNTER — Telehealth: Payer: Self-pay | Admitting: *Deleted

## 2018-11-07 NOTE — Telephone Encounter (Signed)
F/u appt scheduled per GT

## 2018-11-07 NOTE — Telephone Encounter (Signed)
Covid-19 screening questions  Have you traveled in the last 14 days? No. If yes where?  Do you now or have you had a fever in the last 14 days? No.  Do you have any respiratory symptoms of shortness of breath or cough now or in the last 14 days? No.  Do you have any family members or close contacts with diagnosed or suspected Covid-19 in the past 14 days? No.  Have you been tested for Covid-19 and found to be positive? No.       

## 2018-11-07 NOTE — Telephone Encounter (Signed)
Follow up     Pt was scheduled for video visit with Dr. Lovena Le through doxemity on 06.18.20. pt smart phone number is listed in appt notes.       Virtual Visit Pre-Appointment Phone Call  "(Name), I am calling you today to discuss your upcoming appointment. We are currently trying to limit exposure to the virus that causes COVID-19 by seeing patients at home rather than in the office."  1. "What is the BEST phone number to call the day of the visit?" - include this in appointment notes  2. Do you have or have access to (through a family member/friend) a smartphone with video capability that we can use for your visit?" a. If yes - list this number in appt notes as cell (if different from BEST phone #) and list the appointment type as a VIDEO visit in appointment notes b. If no - list the appointment type as a PHONE visit in appointment notes  3. Confirm consent - "In the setting of the current Covid19 crisis, you are scheduled for a (phone or video) visit with your provider on (date) at (time).  Just as we do with many in-office visits, in order for you to participate in this visit, we must obtain consent.  If you'd like, I can send this to your mychart (if signed up) or email for you to review.  Otherwise, I can obtain your verbal consent now.  All virtual visits are billed to your insurance company just like a normal visit would be.  By agreeing to a virtual visit, we'd like you to understand that the technology does not allow for your provider to perform an examination, and thus may limit your provider's ability to fully assess your condition. If your provider identifies any concerns that need to be evaluated in person, we will make arrangements to do so.  Finally, though the technology is pretty good, we cannot assure that it will always work on either your or our end, and in the setting of a video visit, we may have to convert it to a phone-only visit.  In either situation, we cannot  ensure that we have a secure connection.  Are you willing to proceed?" STAFF: Did the patient verbally acknowledge consent to telehealth visit? Document YES/NO here: YES  4. Advise patient to be prepared - "Two hours prior to your appointment, go ahead and check your blood pressure, pulse, oxygen saturation, and your weight (if you have the equipment to check those) and write them all down. When your visit starts, your provider will ask you for this information. If you have an Apple Watch or Kardia device, please plan to have heart rate information ready on the day of your appointment. Please have a pen and paper handy nearby the day of the visit as well."  5. Give patient instructions for MyChart download to smartphone OR Doximity/Doxy.me as below if video visit (depending on what platform provider is using)  6. Inform patient they will receive a phone call 15 minutes prior to their appointment time (may be from unknown caller ID) so they should be prepared to answer    TELEPHONE CALL NOTE  Ebony [name] has been deemed a candidate for a follow-up tele-health visit to limit community exposure during the Covid-19 pandemic. I spoke with the patient via phone to ensure availability of phone/video source, confirm preferred email & phone number, and discuss instructions and expectations.  I reminded Ebony [name] to be prepared with any  vital sign and/or heart rhythm information that could potentially be obtained via home monitoring, at the time of her visit. I reminded Ebony [name] to expect a phone call prior to her visit.  Ebony [name] 11/07/2018 1:21 PM   INSTRUCTIONS FOR DOWNLOADING THE MYCHART APP TO SMARTPHONE  - The patient must first make sure to have activated MyChart and know their login information - If Apple, go to [employer] and type in MyChart in the search bar and download the app. If Android, ask patient to go to [employer] and type in [place] in the search  bar and download the app. The app is free but as with any other app downloads, their phone may require them to verify saved payment information or Apple/Android password.  - The patient will need to then log into the app with their MyChart username and password, and select [facility] as their healthcare provider to link the account. When it is time for your visit, go to the MyChart app, find appointments, and click Begin Video Visit. Be sure to Select Allow for your device to access the Microphone and Camera for your visit. You will then be connected, and your provider will be with you shortly.  **If they have any issues connecting, or need assistance please contact MyChart service desk [phone])**  **If using a computer, in order to ensure the best quality for their visit they will need to use either of the following Internet Browsers: [employer], or Google Chrome**  IF USING DOXIMITY or DOXY.ME - The patient will receive a link just prior to their visit by text.     FULL LENGTH CONSENT FOR TELE-HEALTH VISIT   I hereby voluntarily request, consent and authorize [facility] and its employed or contracted physicians, physician assistants, nurse practitioners or other licensed health care professionals (the Practitioner), to provide me with telemedicine health care services (the Services") as deemed necessary by the treating Practitioner. I acknowledge and consent to receive the Services by the Practitioner via telemedicine. I understand that the telemedicine visit will involve communicating with the Practitioner through live audiovisual communication technology and the disclosure of certain medical information by electronic transmission. I acknowledge that I have been given the opportunity to request an in-person assessment or other available alternative prior to the telemedicine visit and am voluntarily participating in the telemedicine visit.  I understand that I have the  right to withhold or withdraw my consent to the use of telemedicine in the course of my care at any time, without affecting my right to future care or treatment, and that the Practitioner or I may terminate the telemedicine visit at any time. I understand that I have the right to inspect all information obtained and/or recorded in the course of the telemedicine visit and may receive copies of available information for a reasonable fee.  I understand that some of the potential risks of receiving the Services via telemedicine include:   Delay or interruption in medical evaluation due to technological equipment failure or disruption;  Information transmitted may not be sufficient (e.g. poor resolution of images) to allow for appropriate medical decision making by the Practitioner; and/or   In rare instances, security protocols could fail, causing a breach of personal health information.  Furthermore, I acknowledge that it is my responsibility to provide information about my medical history, conditions and care that is complete and accurate to the best of my ability. I acknowledge that Practitioner's advice, recommendations, and/or decision  may be based on factors not within their control, such as incomplete or inaccurate data provided by me or distortions of diagnostic images or specimens that may result from electronic transmissions. I understand that the practice of medicine is not an exact science and that Practitioner makes no warranties or guarantees regarding treatment outcomes. I acknowledge that I will receive a copy of this consent concurrently upon execution via email to the email address I last provided but may also request a printed copy by calling the office of [facility].    I understand that my insurance will be billed for this visit.   I have read or had this consent read to me.  I understand the contents of this consent, which adequately explains the benefits and risks of the Services  being provided via telemedicine.   I have been provided ample opportunity to ask questions regarding this consent and the Services and have had my questions answered to my satisfaction.  I give my informed consent for the services to be provided through the use of telemedicine in my medical care  By participating in this telemedicine visit I agree to the above.

## 2018-11-10 ENCOUNTER — Ambulatory Visit (AMBULATORY_SURGERY_CENTER): Payer: 59 | Admitting: Gastroenterology

## 2018-11-10 ENCOUNTER — Encounter: Payer: Self-pay | Admitting: Gastroenterology

## 2018-11-10 ENCOUNTER — Other Ambulatory Visit: Payer: Self-pay

## 2018-11-10 VITALS — BP 130/82 | HR 66 | Temp 97.7°F | Resp 17 | Ht 65.0 in | Wt 225.0 lb

## 2018-11-10 DIAGNOSIS — D123 Benign neoplasm of transverse colon: Secondary | ICD-10-CM | POA: Diagnosis not present

## 2018-11-10 DIAGNOSIS — K2100 Gastro-esophageal reflux disease with esophagitis, without bleeding: Secondary | ICD-10-CM

## 2018-11-10 DIAGNOSIS — Z1211 Encounter for screening for malignant neoplasm of colon: Secondary | ICD-10-CM

## 2018-11-10 MED ORDER — SODIUM CHLORIDE 0.9 % IV SOLN: 500.0000 mL | Freq: Once | INTRAVENOUS | Status: DC

## 2018-11-10 NOTE — Op Note (Signed)
[facility] Patient Name: Ebony [name] Procedure Date: 11/10/2018 9:41 AM MRN: [id number] Endoscopist: Gerrit Heck , MD Age: 53 Referring MD:  Date of Birth: 1966/04/29 Gender: Female Account #: [id number] Procedure:                Colonoscopy Indications:              Screening for colorectal malignant neoplasm, This                            is the patient's first colonoscopy Medicines:                Monitored Anesthesia Care Procedure:                Pre-Anesthesia Assessment:                           - Prior to the procedure, a History and Physical                            was performed, and patient medications and                            allergies were reviewed. The patient's tolerance of                            previous anesthesia was also reviewed. The risks                            and benefits of the procedure and the sedation                            options and risks were discussed with the patient.                            All questions were answered, and informed consent                            was obtained. Prior Anticoagulants: The patient has                            taken no previous anticoagulant or antiplatelet                            agents. ASA Grade Assessment: II - A patient with                            mild systemic disease. After reviewing the risks                            and benefits, the patient was deemed in                            satisfactory condition to undergo the procedure.  After obtaining informed consent, the colonoscope                            was passed under direct vision. Throughout the                            procedure, the patient's blood pressure, pulse, and                            oxygen saturations were monitored continuously. The                            Colonoscope was introduced through the anus and                            advanced to the the  cecum, identified by                            appendiceal orifice and ileocecal valve. The                            colonoscopy was performed without difficulty. The                            patient tolerated the procedure well. The quality                            of the bowel preparation was adequate. The                            ileocecal valve, appendiceal orifice, and rectum                            were photographed. Scope In: 9:54:44 AM Scope Out: 10:22:19 AM Scope Withdrawal Time: 0 hours 10 minutes 31 seconds  Total Procedure Duration: 0 hours 27 minutes 35 seconds  Findings:                 The perianal and digital rectal examinations were                            normal.                           Four polyps were found in the proximal transverse                            colon and hepatic flexure. The largest was a 10 mm                            flat polyp, removed with hot snare. The remaining                            polyps were 2 to 5 mm in size and removed with a  cole snare. Could not retrieve the 2 smallest                            polyps (suspect disintegrated in the colonoscope                            upon retrieval). Resection was complete. Estimated                            blood loss was minimal.                           The sigmoid colon was moderately tortuous. External                            pressure was applied to the patient's abdomen in                            order to accomplish the maneuver.                           The exam was otherwise normal throughout the                            examined colon.                           The retroflexed view of the distal rectum and anal                            verge was normal and showed no anal or rectal                            abnormalities. Complications:            No immediate complications. Estimated Blood Loss:     Estimated blood loss was  minimal. Impression:               - Four 2 to 10 mm polyps in the proximal transverse                            colon and at the hepatic flexure, removed with a                            hot and cold snare. Resected and retrieved.                           - Tortuous colon.                           - The distal rectum and anal verge are normal on                            retroflexion view. Recommendation:           - Patient has a contact  number available for                            emergencies. The signs and symptoms of potential                            delayed complications were discussed with the                            patient. Return to normal activities tomorrow.                            Written discharge instructions were provided to the                            patient.                           - Resume previous diet.                           - Continue present medications.                           - Await pathology results.                           - Repeat colonoscopy in 3 years for surveillance of                            multiple polyps.                           - Return to GI office PRN. Gerrit Heck, MD 11/10/2018 10:34:37 AM

## 2018-11-10 NOTE — Patient Instructions (Signed)
YOU HAD AN ENDOSCOPIC PROCEDURE TODAY AT [facility]:   Refer to the procedure report that was given to you for any specific questions about what was found during the examination.  If the procedure report does not answer your questions, please call your gastroenterologist to clarify.  If you requested that your care partner not be given the details of your procedure findings, then the procedure report has been included in a sealed envelope for you to review at your convenience later.  YOU SHOULD EXPECT: Some feelings of bloating in the abdomen. Passage of more gas than usual.  Walking can help get rid of the air that was put into your GI tract during the procedure and reduce the bloating. If you had a lower endoscopy (such as a colonoscopy or flexible sigmoidoscopy) you may notice spotting of blood in your stool or on the toilet paper. If you underwent a bowel prep for your procedure, you may not have a normal bowel movement for a few days.  Please Note:  You might notice some irritation and congestion in your nose or some drainage.  This is from the oxygen used during your procedure.  There is no need for concern and it should clear up in a day or so.  SYMPTOMS TO REPORT IMMEDIATELY:   Following lower endoscopy (colonoscopy or flexible sigmoidoscopy):  Excessive amounts of blood in the stool  Significant tenderness or worsening of abdominal pains  Swelling of the abdomen that is new, acute  Fever of 100F or higher   Following upper endoscopy (EGD)  Vomiting of blood or coffee ground material  New chest pain or pain under the shoulder blades  Painful or persistently difficult swallowing  New shortness of breath  Fever of 100F or higher  Black, tarry-looking stools  For urgent or emergent issues, a gastroenterologist can be reached at any hour by calling [phone].   DIET:  We do recommend a small meal at first, but then you may proceed to your regular diet.  Drink  plenty of fluids but you should avoid alcoholic beverages for 24 hours.  ACTIVITY:  You should plan to take it easy for the rest of today and you should NOT DRIVE or use heavy machinery until tomorrow (because of the sedation medicines used during the test).    FOLLOW UP: Our staff will call the number listed on your records 48-72 hours following your procedure to check on you and address any questions or concerns that you may have regarding the information given to you following your procedure. If we do not reach you, we will leave a message.  We will attempt to reach you two times.  During this call, we will ask if you have developed any symptoms of COVID 19. If you develop any symptoms (ie: fever, flu-like symptoms, shortness of breath, cough etc.) before then, please call [phone].  If you test positive for Covid 19 in the 2 weeks post procedure, please call and report this information to [name].    If any biopsies were taken you will be contacted by phone or by letter within the next 1-3 weeks.  Please call us at [phone] if you have not heard about the biopsies in 3 weeks.  Await for biopsy results Polyps (handout given)  SIGNATURES/CONFIDENTIALITY: You and/or your care partner have signed paperwork which will be entered into your electronic medical record.  These signatures attest to the fact that that the information above on your After Visit  Summary has been reviewed and is understood.  Full responsibility of the confidentiality of this discharge information lies with you and/or your care-partner. 

## 2018-11-10 NOTE — Op Note (Signed)
[facility] Patient Name: Ebony [name] Procedure Date: 11/10/2018 9:41 AM MRN: [id number] Endoscopist: Gerrit Heck , MD Age: 53 Referring MD:  Date of Birth: Mar 04, 1966 Gender: Female Account #: [id number] Procedure:                Upper GI endoscopy Indications:              Heartburn, Suspected esophageal reflux Medicines:                Monitored Anesthesia Care Procedure:                Pre-Anesthesia Assessment:                           - Prior to the procedure, a History and Physical                            was performed, and patient medications and                            allergies were reviewed. The patient's tolerance of                            previous anesthesia was also reviewed. The risks                            and benefits of the procedure and the sedation                            options and risks were discussed with the patient.                            All questions were answered, and informed consent                            was obtained. Prior Anticoagulants: The patient has                            taken no previous anticoagulant or antiplatelet                            agents. ASA Grade Assessment: II - A patient with                            mild systemic disease. After reviewing the risks                            and benefits, the patient was deemed in                            satisfactory condition to undergo the procedure.                           After obtaining informed consent, the endoscope was  passed under direct vision. Throughout the                            procedure, the patient's blood pressure, pulse, and                            oxygen saturations were monitored continuously. The                            Endoscope was introduced through the mouth, and                            advanced to the second part of duodenum. The upper                            GI endoscopy  was accomplished without difficulty.                            The patient tolerated the procedure well. Scope In: Scope Out: Findings:                 LA Grade A (one or more mucosal breaks less than 5                            mm, not extending between tops of 2 mucosal folds)                            esophagitis with no bleeding was found 38 cm from                            the incisors.                           The upper third of the esophagus and middle third                            of the esophagus were normal.                           Esophagogastric landmarks were identified: the                            Z-line was found at 38 cm, the gastroesophageal                            junction was found at 38 cm and the site of hiatal                            narrowing was found at 38 cm from the incisors.                           The gastroesophageal flap valve was visualized  endoscopically and classified as Hill Grade II                            (fold present, opens with respiration).                           The entire examined stomach was normal.                           The duodenal bulb, first portion of the duodenum                            and second portion of the duodenum were normal. Complications:            No immediate complications. Estimated Blood Loss:     Estimated blood loss: none. Impression:               - LA Grade A reflux esophagitis.                           - Normal upper third of esophagus and middle third                            of esophagus.                           - Esophagogastric landmarks identified.                           - Gastroesophageal flap valve classified as Hill                            Grade II (fold present, opens with respiration).                           - Normal stomach.                           - Normal duodenal bulb, first portion of the                            duodenum  and second portion of the duodenum.                           - No specimens collected. Recommendation:           - Patient has a contact number available for                            emergencies. The signs and symptoms of potential                            delayed complications were discussed with the                            patient. Return to normal activities tomorrow.  Written discharge instructions were provided to the                            patient.                           - Resume previous diet today.                           - Continue present medications.                           - Return to GI clinic PRN.                           - Use Protonix (pantoprazole) 40 mg PO BID for 4                            weeks, then reduce to 40 mg daily and continue to                            titrate to the lowest effective dose for ongoing                            control of reflux. Gerrit Heck, MD 11/10/2018 10:28:29 AM

## 2018-11-10 NOTE — Progress Notes (Signed)
Report to PACU, RN, vss, BBS= Clear.  

## 2018-11-10 NOTE — Progress Notes (Signed)
Pt's states no medical or surgical changes since previsit or office visit. 

## 2018-11-12 ENCOUNTER — Telehealth: Payer: Self-pay

## 2018-11-12 ENCOUNTER — Other Ambulatory Visit: Payer: Self-pay

## 2018-11-12 ENCOUNTER — Encounter: Payer: Self-pay | Admitting: Gastroenterology

## 2018-11-12 DIAGNOSIS — K219 Gastro-esophageal reflux disease without esophagitis: Secondary | ICD-10-CM

## 2018-11-12 MED ORDER — PANTOPRAZOLE SODIUM 40 MG PO TBEC: 40.0000 mg | DELAYED_RELEASE_TABLET | Freq: Two times a day (BID) | ORAL | 1 refills | Status: DC

## 2018-11-12 NOTE — Telephone Encounter (Signed)
  Follow up Call-  Call back number 11/10/2018  Post procedure Call Back phone  # [phone]  Permission to leave phone message Yes  Some recent data might be hidden     Patient questions:  Do you have a fever, pain , or abdominal swelling? No. Pain Score  0 *  Have you tolerated food without any problems? Yes.    Have you been able to return to your normal activities? Yes.    Do you have any questions about your discharge instructions: Diet   No. Medications  Yes.  [place] Follow up visit  No.  Do you have questions or concerns about your Care? No.  Actions: * If pain score is 4 or above: No action needed, pain <4.  1. Have you developed a fever since your procedure? no  2.   Have you had an respiratory symptoms (SOB or cough) since your procedure? no  3.   Have you tested positive for COVID 19 since your procedure no  4.   Have you had any family members/close contacts diagnosed with the COVID 19 since your procedure?  no   If yes to any of these questions please route to Joylene John, RN and Alphonsa Gin, Therapist, sports.

## 2018-11-18 ENCOUNTER — Other Ambulatory Visit: Payer: Self-pay | Admitting: Family Medicine

## 2018-11-20 ENCOUNTER — Telehealth (INDEPENDENT_AMBULATORY_CARE_PROVIDER_SITE_OTHER): Payer: 59 | Admitting: Internal Medicine

## 2018-11-20 ENCOUNTER — Other Ambulatory Visit: Payer: Self-pay

## 2018-11-20 DIAGNOSIS — I471 Supraventricular tachycardia: Secondary | ICD-10-CM | POA: Diagnosis not present

## 2018-11-20 NOTE — H&P (View-Only) (Signed)
Electrophysiology TeleHealth Note   Due to national recommendations of social distancing due to COVID 19, an audio/video telehealth visit is felt to be most appropriate for this patient at this time.  See MyChart message from today for the patient's consent to telehealth for [facility].   Date:  11/20/2018   ID:  Ebony [name], DOB Jan 01, 1966, MRN [id number]  Location: patient's home  Provider location: [address] [place]  Evaluation Performed: Follow-up visit  PCP:  Mosie Lukes, MD  Cardiologist:  No primary care provider on file. Dr. London Sheer Electrophysiologist:  Dr Lovena Le  Chief Complaint:  "My heart is still racing."  History of Present Illness:    Ebony [name] is a 53 y.o. female who presents via audio/video conferencing for a telehealth visit today. She has a h/o SVT dating back to her teenage years. The episodes start and stop suddenly and are sensitive to IV adenosine. They have been present despite medical therapy. She has not had syncope. She is not pre-excited. I saw her last about 6 months ago and recommended catheter ablation. Since then she has had multiple episodes, usually awakening her at night and lasting an hour or more. Since last being seen in our clinic, the patient reports doing very well.  The patient denies symptoms of fevers, chills, cough, or new SOB worrisome for COVID 19.  Past Medical History:  Diagnosis Date  . Arthritis   . BCC (basal cell carcinoma), face    skin   . Concussion with no loss of consciousness 12/28/2014  . Depression with anxiety 10/06/2012  . Diabetes mellitus without complication (HCC)    no meds  . GERD (gastroesophageal reflux disease) 09/05/2015  . Headache(784.0)    otc meds prn, migraine  . Hyperlipidemia, mixed 12/28/2014  . Hypertension    only with preg. 82yrs ago 2002- no meds  . Hypothyroid 06/15/2012  . Obesity 09/05/2015  . Post-operative nausea and vomiting   . SVT (supraventricular  tachycardia) ([place])   . Tinea pedis 12/28/2014    Past Surgical History:  Procedure Laterality Date  . ABDOMINAL HYSTERECTOMY  03/04/2012   Procedure: HYSTERECTOMY ABDOMINAL;  Surgeon: Marylynn Pearson, MD;  Location: [facility];  Service: Gynecology;  Laterality: N/A;  with lysis of adhesions  . BASAL CELL CARCINOMA EXCISION Left    cheek/face  . CESAREAN SECTION  2002   HELLP Syndrome/ one time  . DILATION AND CURETTAGE OF UTERUS  2007   Miscarriage  . LAPAROSCOPIC TUBAL LIGATION  01/03/2011   Procedure: LAPAROSCOPIC TUBAL LIGATION;  Surgeon: Marylynn Pearson;  Location: [facility];  Service: Gynecology;  Laterality: Bilateral;  with filshie clips  . LAPAROSCOPY  03/04/2012   Procedure: LAPAROSCOPY DIAGNOSTIC;  Surgeon: Marylynn Pearson, MD;  Location: [facility];  Service: Gynecology;  Laterality: N/A;  with lysis of adhesions  . SHOULDER ARTHROSCOPY  2012   right  . TONSILLECTOMY      Current Outpatient Medications  Medication Sig Dispense Refill  . diltiazem (CARDIZEM CD) 300 MG 24 hr capsule Take 1 capsule (300 mg total) by mouth daily. 30 capsule 5  . famotidine (PEPCID) 40 MG tablet TAKE 1 TABLET BY MOUTH EVERYDAY AT BEDTIME 90 tablet 1  . levothyroxine (SYNTHROID, LEVOTHROID) 75 MCG tablet TAKE 1 TABLET BY MOUTH DAILY BEFORE BREAKFAST. 90 tablet 3  . pantoprazole (PROTONIX) 40 MG tablet Take 1 tablet (40 mg total) by mouth 2 (two) times daily. 90 tablet 1  . venlafaxine XR (  EFFEXOR-XR) 75 MG 24 hr capsule TAKE ONE CAPSULE BY MOUTH EVERY DAY WITH BREAKFAST 60 capsule 2   Current Facility-Administered Medications  Medication Dose Route Frequency Provider Last Rate Last Dose  . 0.9 %  sodium chloride infusion  500 mL Intravenous Once Cirigliano, Vito V, DO        Allergies:   Patient has no known allergies.   Social History:  The patient  reports that she has never smoked. She has never used smokeless tobacco. She reports current alcohol use. She reports that she does not use drugs.   Family  History:  The patient's  family history includes Basal cell carcinoma (age of onset: 19) in her brother; Breast cancer (age of onset: 51) in her cousin; Breast cancer (age of onset: 29) in her mother; Depression in her mother; Emphysema in her maternal grandfather; Hypertension in her mother; Lung cancer in her mother; Other in her maternal grandmother; Prostate cancer in her maternal grandfather.   ROS:  Please see the history of present illness.   All other systems are personally reviewed and negative.    Exam:    Vital Signs:  LMP 12/15/2010   Well appearing, alert and conversant, regular work of breathing,  good skin color Eyes- anicteric, neuro- grossly intact, skin- no apparent rash or lesions or cyanosis, mouth- oral mucosa is pink   Labs/Other Tests and Data Reviewed:    Recent Labs: 03/28/2018: ALT 15; BUN 14; Creatinine, Ser 0.79; Potassium 4.6; Sodium 142; TSH 1.91 04/24/2018: Hemoglobin 14.8; Platelets 346   Wt Readings from Last 3 Encounters:  11/10/18 225 lb (102.1 kg)  10/20/18 225 lb (102.1 kg)  05/21/18 220 lb (99.8 kg)     Other studies personally reviewed: none   ASSESSMENT & PLAN:    1.  SVT - I have discussed the indications for proceeding with catheter ablation of SVT and she wishes to proceed. 2. COVID 19 screen The patient denies symptoms of COVID 19 at this time.  The importance of social distancing was discussed today.  Follow-up:  4-6 weeks Next remote: n/a  Current medicines are reviewed at length with the patient today.   The patient does not have concerns regarding her medicines.  The following changes were made today:  none  Labs/ tests ordered today include: none No orders of the defined types were placed in this encounter.    Patient Risk:  after full review of this patients clinical status, I feel that they are at moderate risk at this time.  Today, I have spent 15 minutes with the patient with telehealth technology discussing all of  the above .    Signed, Cristopher Peru, MD  11/20/2018 8:56 AM     [place] [address] [place] [address] [phone] (office) [phone] (fax)

## 2018-11-20 NOTE — Progress Notes (Signed)
Electrophysiology TeleHealth Note   Due to national recommendations of social distancing due to COVID 19, an audio/video telehealth visit is felt to be most appropriate for this patient at this time.  See MyChart message from today for the patient's consent to telehealth for [facility].   Date:  11/20/2018   ID:  Ebony [name], DOB [birth year], MRN [id number]  Location: patient's home  Provider location: [address] [place]  Evaluation Performed: Follow-up visit  PCP:  Mosie Lukes, MD  Cardiologist:  No primary care provider on file. Dr. London Sheer Electrophysiologist:  Dr Lovena Le  Chief Complaint:  "My heart is still racing."  History of Present Illness:    Ebony [name] is a 53 y.o. female who presents via audio/video conferencing for a telehealth visit today. She has a h/o SVT dating back to her teenage years. The episodes start and stop suddenly and are sensitive to IV adenosine. They have been present despite medical therapy. She has not had syncope. She is not pre-excited. I saw her last about 6 months ago and recommended catheter ablation. Since then she has had multiple episodes, usually awakening her at night and lasting an hour or more. Since last being seen in our clinic, the patient reports doing very well.  The patient denies symptoms of fevers, chills, cough, or new SOB worrisome for COVID 19.  Past Medical History:  Diagnosis Date  . Arthritis   . BCC (basal cell carcinoma), face    skin   . Concussion with no loss of consciousness 12/28/2014  . Depression with anxiety 10/06/2012  . Diabetes mellitus without complication (HCC)    no meds  . GERD (gastroesophageal reflux disease) 09/05/2015  . Headache(784.0)    otc meds prn, migraine  . Hyperlipidemia, mixed 12/28/2014  . Hypertension    only with preg. 41yrs ago 2002- no meds  . Hypothyroid 06/15/2012  . Obesity 09/05/2015  . Post-operative nausea and vomiting   . SVT (supraventricular  tachycardia) ([place])   . Tinea pedis 12/28/2014    Past Surgical History:  Procedure Laterality Date  . ABDOMINAL HYSTERECTOMY  03/04/2012   Procedure: HYSTERECTOMY ABDOMINAL;  Surgeon: Marylynn Pearson, MD;  Location: [facility];  Service: Gynecology;  Laterality: N/A;  with lysis of adhesions  . BASAL CELL CARCINOMA EXCISION Left    cheek/face  . CESAREAN SECTION  2002   HELLP Syndrome/ one time  . DILATION AND CURETTAGE OF UTERUS  2007   Miscarriage  . LAPAROSCOPIC TUBAL LIGATION  01/03/2011   Procedure: LAPAROSCOPIC TUBAL LIGATION;  Surgeon: Marylynn Pearson;  Location: [facility];  Service: Gynecology;  Laterality: Bilateral;  with filshie clips  . LAPAROSCOPY  03/04/2012   Procedure: LAPAROSCOPY DIAGNOSTIC;  Surgeon: Marylynn Pearson, MD;  Location: [facility];  Service: Gynecology;  Laterality: N/A;  with lysis of adhesions  . SHOULDER ARTHROSCOPY  2012   right  . TONSILLECTOMY      Current Outpatient Medications  Medication Sig Dispense Refill  . diltiazem (CARDIZEM CD) 300 MG 24 hr capsule Take 1 capsule (300 mg total) by mouth daily. 30 capsule 5  . famotidine (PEPCID) 40 MG tablet TAKE 1 TABLET BY MOUTH EVERYDAY AT BEDTIME 90 tablet 1  . levothyroxine (SYNTHROID, LEVOTHROID) 75 MCG tablet TAKE 1 TABLET BY MOUTH DAILY BEFORE BREAKFAST. 90 tablet 3  . pantoprazole (PROTONIX) 40 MG tablet Take 1 tablet (40 mg total) by mouth 2 (two) times daily. 90 tablet 1  . venlafaxine XR (  EFFEXOR-XR) 75 MG 24 hr capsule TAKE ONE CAPSULE BY MOUTH EVERY DAY WITH BREAKFAST 60 capsule 2   Current Facility-Administered Medications  Medication Dose Route Frequency Provider Last Rate Last Dose  . 0.9 %  sodium chloride infusion  500 mL Intravenous Once Cirigliano, Vito V, DO        Allergies:   Patient has no known allergies.   Social History:  The patient  reports that she has never smoked. She has never used smokeless tobacco. She reports current alcohol use. She reports that she does not use drugs.   Family  History:  The patient's  family history includes Basal cell carcinoma (age of onset: 60) in her brother; Breast cancer (age of onset: 41) in her cousin; Breast cancer (age of onset: 78) in her mother; Depression in her mother; Emphysema in her maternal grandfather; Hypertension in her mother; Lung cancer in her mother; Other in her maternal grandmother; Prostate cancer in her maternal grandfather.   ROS:  Please see the history of present illness.   All other systems are personally reviewed and negative.    Exam:    Vital Signs:  LMP 12/15/2010   Well appearing, alert and conversant, regular work of breathing,  good skin color Eyes- anicteric, neuro- grossly intact, skin- no apparent rash or lesions or cyanosis, mouth- oral mucosa is pink   Labs/Other Tests and Data Reviewed:    Recent Labs: 03/28/2018: ALT 15; BUN 14; Creatinine, Ser 0.79; Potassium 4.6; Sodium 142; TSH 1.91 04/24/2018: Hemoglobin 14.8; Platelets 346   Wt Readings from Last 3 Encounters:  11/10/18 225 lb (102.1 kg)  10/20/18 225 lb (102.1 kg)  05/21/18 220 lb (99.8 kg)     Other studies personally reviewed: none   ASSESSMENT & PLAN:    1.  SVT - I have discussed the indications for proceeding with catheter ablation of SVT and she wishes to proceed. 2. COVID 19 screen The patient denies symptoms of COVID 19 at this time.  The importance of social distancing was discussed today.  Follow-up:  4-6 weeks Next remote: n/a  Current medicines are reviewed at length with the patient today.   The patient does not have concerns regarding her medicines.  The following changes were made today:  none  Labs/ tests ordered today include: none No orders of the defined types were placed in this encounter.    Patient Risk:  after full review of this patients clinical status, I feel that they are at moderate risk at this time.  Today, I have spent 15 minutes with the patient with telehealth technology discussing all of  the above .    Signed, Cristopher Peru, MD  11/20/2018 8:56 AM     [place] [address] [place] [address] [phone] (office) [phone] (fax)

## 2018-11-27 ENCOUNTER — Telehealth: Payer: Self-pay

## 2018-11-27 DIAGNOSIS — I471 Supraventricular tachycardia: Secondary | ICD-10-CM

## 2018-11-27 NOTE — Telephone Encounter (Signed)
Spoke with husband.  Husband advised this nurse to send Pt a mychart message.  Message sent

## 2018-12-04 NOTE — Telephone Encounter (Signed)
Pt scheduled for SVT ablation on 12/17/2018  Work up complete

## 2018-12-15 ENCOUNTER — Other Ambulatory Visit: Payer: Self-pay

## 2018-12-15 ENCOUNTER — Other Ambulatory Visit (HOSPITAL_COMMUNITY)
Admission: RE | Admit: 2018-12-15 | Discharge: 2018-12-15 | Disposition: A | Discharge status: Home / Self Care | Payer: 59 | Source: Ambulatory Visit | Attending: Internal Medicine | Admitting: Internal Medicine

## 2018-12-15 ENCOUNTER — Other Ambulatory Visit: Payer: 59 | Admitting: *Deleted

## 2018-12-15 DIAGNOSIS — Z1159 Encounter for screening for other viral diseases: Secondary | ICD-10-CM | POA: Insufficient documentation

## 2018-12-15 DIAGNOSIS — I471 Supraventricular tachycardia: Secondary | ICD-10-CM

## 2018-12-15 LAB — SARS CORONAVIRUS 2 (TAT 6-24 HRS): SARS Coronavirus 2: NEGATIVE

## 2018-12-16 LAB — BASIC METABOLIC PANEL
BUN/Creatinine Ratio: 23 (ref 9–23)
BUN: 21 mg/dL (ref 6–24)
CO2: 22 mmol/L (ref 20–29)
Calcium: 10 mg/dL (ref 8.7–10.2)
Chloride: 105 mmol/L (ref 96–106)
Creatinine, Ser: 0.9 mg/dL (ref 0.57–1.00)
GFR calc Af Amer: 86 mL/min/{1.73_m2} (ref >59)
GFR calc non Af Amer: 75 mL/min/{1.73_m2} (ref >59)
Glucose: 95 mg/dL (ref 65–99)
Potassium: 4.4 mmol/L (ref 3.5–5.2)
Sodium: 142 mmol/L (ref 134–144)

## 2018-12-16 LAB — CBC WITH DIFFERENTIAL/PLATELET
Basophils Absolute: 0.1 10*3/uL (ref 0.0–0.2)
Basos: 1 %
EOS (ABSOLUTE): 0.3 10*3/uL (ref 0.0–0.4)
Eos: 3 %
Hematocrit: 42.5 % (ref 34.0–46.6)
Hemoglobin: 13.9 g/dL (ref 11.1–15.9)
Immature Grans (Abs): 0 10*3/uL (ref 0.0–0.1)
Immature Granulocytes: 0 %
Lymphocytes Absolute: 2.8 10*3/uL (ref 0.7–3.1)
Lymphs: 38 %
MCH: 28.8 pg (ref 26.6–33.0)
MCHC: 32.7 g/dL (ref 31.5–35.7)
MCV: 88 fL (ref 79–97)
Monocytes Absolute: 0.5 10*3/uL (ref 0.1–0.9)
Monocytes: 7 %
Neutrophils Absolute: 3.7 10*3/uL (ref 1.4–7.0)
Neutrophils: 51 %
Platelets: 323 10*3/uL (ref 150–450)
RBC: 4.82 x10E6/uL (ref 3.77–5.28)
RDW: 13.4 % (ref 11.7–15.4)
WBC: 7.3 10*3/uL (ref 3.4–10.8)

## 2018-12-17 ENCOUNTER — Other Ambulatory Visit: Payer: Self-pay

## 2018-12-17 ENCOUNTER — Ambulatory Visit (HOSPITAL_COMMUNITY)
Admission: RE | Admit: 2018-12-17 | Discharge: 2018-12-17 | Disposition: A | Discharge status: Home / Self Care | Payer: 59 | Attending: Internal Medicine | Admitting: Internal Medicine

## 2018-12-17 ENCOUNTER — Ambulatory Visit (HOSPITAL_COMMUNITY)
Admission: RE | Disposition: A | Discharge status: Home / Self Care | Payer: Self-pay | Source: Home / Self Care | Attending: Internal Medicine

## 2018-12-17 DIAGNOSIS — E119 Type 2 diabetes mellitus without complications: Secondary | ICD-10-CM | POA: Diagnosis not present

## 2018-12-17 DIAGNOSIS — Z6836 Body mass index (BMI) 36.0-36.9, adult: Secondary | ICD-10-CM | POA: Diagnosis not present

## 2018-12-17 DIAGNOSIS — K219 Gastro-esophageal reflux disease without esophagitis: Secondary | ICD-10-CM | POA: Diagnosis not present

## 2018-12-17 DIAGNOSIS — Z79890 Hormone replacement therapy (postmenopausal): Secondary | ICD-10-CM | POA: Insufficient documentation

## 2018-12-17 DIAGNOSIS — E669 Obesity, unspecified: Secondary | ICD-10-CM | POA: Insufficient documentation

## 2018-12-17 DIAGNOSIS — E039 Hypothyroidism, unspecified: Secondary | ICD-10-CM | POA: Insufficient documentation

## 2018-12-17 DIAGNOSIS — E782 Mixed hyperlipidemia: Secondary | ICD-10-CM | POA: Diagnosis not present

## 2018-12-17 DIAGNOSIS — F418 Other specified anxiety disorders: Secondary | ICD-10-CM | POA: Diagnosis not present

## 2018-12-17 DIAGNOSIS — I471 Supraventricular tachycardia, unspecified: Secondary | ICD-10-CM | POA: Diagnosis present

## 2018-12-17 DIAGNOSIS — Z79899 Other long term (current) drug therapy: Secondary | ICD-10-CM | POA: Diagnosis not present

## 2018-12-17 DIAGNOSIS — Z85828 Personal history of other malignant neoplasm of skin: Secondary | ICD-10-CM | POA: Diagnosis not present

## 2018-12-17 HISTORY — PX: SVT ABLATION: EP1225

## 2018-12-17 LAB — GLUCOSE, CAPILLARY: Glucose-Capillary: 105 mg/dL — ABNORMAL HIGH (ref 70–99)

## 2018-12-17 SURGERY — SVT ABLATION

## 2018-12-17 MED ORDER — FENTANYL CITRATE (PF) 100 MCG/2ML IJ SOLN
INTRAMUSCULAR | Status: AC
  Filled 2018-12-17: qty 2

## 2018-12-17 MED ORDER — SODIUM CHLORIDE 0.9 % IV SOLN
INTRAVENOUS | Status: DC
  Administered 2018-12-17: 10:00:00 via INTRAVENOUS

## 2018-12-17 MED ORDER — MIDAZOLAM HCL 5 MG/5ML IJ SOLN
INTRAMUSCULAR | Status: DC | PRN
  Administered 2018-12-17 (×8): 1 mg via INTRAVENOUS
  Administered 2018-12-17: 2 mg via INTRAVENOUS

## 2018-12-17 MED ORDER — BUPIVACAINE HCL (PF) 0.25 % IJ SOLN
INTRAMUSCULAR | Status: AC
  Filled 2018-12-17: qty 60

## 2018-12-17 MED ORDER — FENTANYL CITRATE (PF) 100 MCG/2ML IJ SOLN
INTRAMUSCULAR | Status: DC | PRN
  Administered 2018-12-17: 12.5 ug via INTRAVENOUS
  Administered 2018-12-17: 25 ug via INTRAVENOUS
  Administered 2018-12-17 (×3): 12.5 ug via INTRAVENOUS
  Administered 2018-12-17: 25 ug via INTRAVENOUS
  Administered 2018-12-17 (×2): 12.5 ug via INTRAVENOUS

## 2018-12-17 MED ORDER — SODIUM CHLORIDE 0.9% FLUSH: 3.0000 mL | INTRAVENOUS | Status: DC | PRN

## 2018-12-17 MED ORDER — MIDAZOLAM HCL 5 MG/5ML IJ SOLN
INTRAMUSCULAR | Status: AC
  Filled 2018-12-17: qty 5

## 2018-12-17 MED ORDER — ACETAMINOPHEN 325 MG PO TABS
650.0000 mg | ORAL_TABLET | ORAL | Status: DC | PRN
  Administered 2018-12-17: 650 mg via ORAL
  Filled 2018-12-17: qty 2

## 2018-12-17 MED ORDER — SODIUM CHLORIDE 0.9 % IV SOLN: 250.0000 mL | INTRAVENOUS | Status: DC | PRN

## 2018-12-17 MED ORDER — ONDANSETRON HCL 4 MG/2ML IJ SOLN: 4.0000 mg | Freq: Four times a day (QID) | INTRAMUSCULAR | Status: DC | PRN

## 2018-12-17 MED ORDER — BUPIVACAINE HCL (PF) 0.25 % IJ SOLN
INTRAMUSCULAR | Status: DC | PRN
  Administered 2018-12-17: 30 mL
  Administered 2018-12-17: 20 mL

## 2018-12-17 MED ORDER — ACETAMINOPHEN 325 MG PO TABS
ORAL_TABLET | ORAL | Status: AC
  Filled 2018-12-17: qty 2

## 2018-12-17 MED ORDER — HEPARIN (PORCINE) IN NACL 1000-0.9 UT/500ML-% IV SOLN
INTRAVENOUS | Status: DC | PRN
  Administered 2018-12-17: 500 mL

## 2018-12-17 MED ORDER — SODIUM CHLORIDE 0.9% FLUSH: 3.0000 mL | Freq: Two times a day (BID) | INTRAVENOUS | Status: DC

## 2018-12-17 MED ORDER — HEPARIN (PORCINE) IN NACL 1000-0.9 UT/500ML-% IV SOLN
INTRAVENOUS | Status: AC
  Filled 2018-12-17: qty 500

## 2018-12-17 SURGICAL SUPPLY — 11 items
BAG SNAP BAND KOVER 36X36 (MISCELLANEOUS) ×2 IMPLANT
CATH CELSIUS THERM D CV 7F (ABLATOR) ×2 IMPLANT
CATH HEX JOSEPH 2-5-2 65CM 6F (CATHETERS) ×2 IMPLANT
CATH JOSEPHSON QUAD-ALLRED 6FR (CATHETERS) ×4 IMPLANT
PACK EP LATEX FREE (CUSTOM PROCEDURE TRAY) ×3
PACK EP LF (CUSTOM PROCEDURE TRAY) ×1 IMPLANT
PAD PRO RADIOLUCENT 2001M-C (PAD) ×3 IMPLANT
SHEATH PINNACLE 6F 10CM (SHEATH) ×6 IMPLANT
SHEATH PINNACLE 7F 10CM (SHEATH) ×2 IMPLANT
SHEATH PINNACLE 8F 10CM (SHEATH) ×2 IMPLANT
SHIELD RADPAD SCOOP 12X17 (MISCELLANEOUS) ×2 IMPLANT

## 2018-12-17 NOTE — Progress Notes (Signed)
Site area: 3 rt fv sheaths pulled and pressure held by Carlean Jews Site Prior to Removal:  Level 0 Pressure Applied For:  20 minutes Manual:   yes Patient Status During Pull:  stable Post Pull Site:  Level  0 Post Pull Instructions Given:  yes Post Pull Pulses Present: rt dp palpable Dressing Applied:  Gauze and tegaderm Bedrest begins @ 1335 Comments:  IV saline locked

## 2018-12-17 NOTE — Discharge Instructions (Signed)

## 2018-12-17 NOTE — Progress Notes (Signed)
No bleeding or swelling noted after ambulation 

## 2018-12-17 NOTE — Progress Notes (Signed)
Site area: rt IJ venous sheath Site Prior to Removal:  Level 0 Pressure Applied For: 10 minutes Manual:   yes Patient Status During Pull:  stable Post Pull Site:  Level 0 Post Pull Instructions Given:  yes Post Pull Pulses Present: NA Dressing Applied:  Vasolene gauze, gauze and tegaderm Bedrest begins @  Comments:

## 2018-12-17 NOTE — Interval H&P Note (Signed)
History and Physical Interval Note:  12/17/2018 11:00 AM  Ebony [name]  has presented today for surgery, with the diagnosis of SVT.  The various methods of treatment have been discussed with the patient and family. After consideration of risks, benefits and other options for treatment, the patient has consented to  Procedure(s): SVT ABLATION (N/A) as a surgical intervention.  The patient's history has been reviewed, patient examined, no change in status, stable for surgery.  I have reviewed the patient's chart and labs.  Questions were answered to the patient's satisfaction.     Cristopher Peru

## 2018-12-18 ENCOUNTER — Encounter (HOSPITAL_COMMUNITY): Payer: Self-pay | Admitting: Internal Medicine

## 2018-12-18 MED FILL — Midazolam HCl Inj 5 MG/5ML (Base Equivalent): INTRAMUSCULAR | Qty: 5 | Status: AC

## 2019-01-05 ENCOUNTER — Ambulatory Visit (INDEPENDENT_AMBULATORY_CARE_PROVIDER_SITE_OTHER): Payer: 59 | Admitting: Family Medicine

## 2019-01-05 ENCOUNTER — Other Ambulatory Visit: Payer: Self-pay

## 2019-01-05 ENCOUNTER — Encounter: Payer: Self-pay | Admitting: Family Medicine

## 2019-01-05 VITALS — BP 137/77 | HR 72 | Temp 97.5°F | Resp 16 | Ht 65.0 in | Wt 224.3 lb

## 2019-01-05 DIAGNOSIS — Z1502 Genetic susceptibility to malignant neoplasm of ovary: Secondary | ICD-10-CM | POA: Diagnosis not present

## 2019-01-05 DIAGNOSIS — Z0000 Encounter for general adult medical examination without abnormal findings: Secondary | ICD-10-CM | POA: Diagnosis not present

## 2019-01-05 DIAGNOSIS — E039 Hypothyroidism, unspecified: Secondary | ICD-10-CM | POA: Diagnosis not present

## 2019-01-05 DIAGNOSIS — E782 Mixed hyperlipidemia: Secondary | ICD-10-CM

## 2019-01-05 DIAGNOSIS — E119 Type 2 diabetes mellitus without complications: Secondary | ICD-10-CM

## 2019-01-05 LAB — LIPID PANEL
Cholesterol: 208 mg/dL — ABNORMAL HIGH (ref 0–200)
HDL: 55.3 mg/dL (ref >39.00)
LDL Cholesterol: 119 mg/dL — ABNORMAL HIGH (ref 0–99)
NonHDL: 152.22
Total CHOL/HDL Ratio: 4
Triglycerides: 164 mg/dL — ABNORMAL HIGH (ref 0.0–149.0)
VLDL: 32.8 mg/dL (ref 0.0–40.0)

## 2019-01-05 LAB — TSH: TSH: 1.94 u[IU]/mL (ref 0.35–4.50)

## 2019-01-05 LAB — HEMOGLOBIN A1C: Hgb A1c MFr Bld: 6.3 % (ref 4.6–6.5)

## 2019-01-05 NOTE — Patient Instructions (Signed)
Omron BP cuff upper arm Pulse Oximeter   Check vitals weekly  Zinc 30-50 mg daily Preventive Care 33-51 Years Old, Female Preventive care refers to visits with your health care provider and lifestyle choices that can promote health and wellness. This includes:  A yearly physical exam. This may also be called an annual well check.  Regular dental visits and eye exams.  Immunizations.  Screening for certain conditions.  Healthy lifestyle choices, such as eating a healthy diet, getting regular exercise, not using drugs or products that contain nicotine and tobacco, and limiting alcohol use. What can I expect for my preventive care visit? Physical exam Your health care provider will check your:  Height and weight. This may be used to calculate body mass index (BMI), which tells if you are at a healthy weight.  Heart rate and blood pressure.  Skin for abnormal spots. Counseling Your health care provider may ask you questions about your:  Alcohol, tobacco, and drug use.  Emotional well-being.  Home and relationship well-being.  Sexual activity.  Eating habits.  Work and work Statistician.  Method of birth control.  Menstrual cycle.  Pregnancy history. What immunizations do I need?  Influenza (flu) vaccine  This is recommended every year. Tetanus, diphtheria, and pertussis (Tdap) vaccine  You may need a Td booster every 10 years. Varicella (chickenpox) vaccine  You may need this if you have not been vaccinated. Zoster (shingles) vaccine  You may need this after age 31. Measles, mumps, and rubella (MMR) vaccine  You may need at least one dose of MMR if you were born in 1957 or later. You may also need a second dose. Pneumococcal conjugate (PCV13) vaccine  You may need this if you have certain conditions and were not previously vaccinated. Pneumococcal polysaccharide (PPSV23) vaccine  You may need one or two doses if you smoke cigarettes or if you have  certain conditions. Meningococcal conjugate (MenACWY) vaccine  You may need this if you have certain conditions. Hepatitis A vaccine  You may need this if you have certain conditions or if you travel or work in places where you may be exposed to hepatitis A. Hepatitis B vaccine  You may need this if you have certain conditions or if you travel or work in places where you may be exposed to hepatitis B. Haemophilus influenzae type b (Hib) vaccine  You may need this if you have certain conditions. Human papillomavirus (HPV) vaccine  If recommended by your health care provider, you may need three doses over 6 months. You may receive vaccines as individual doses or as more than one vaccine together in one shot (combination vaccines). Talk with your health care provider about the risks and benefits of combination vaccines. What tests do I need? Blood tests  Lipid and cholesterol levels. These may be checked every 5 years, or more frequently if you are over 63 years old.  Hepatitis C test.  Hepatitis B test. Screening  Lung cancer screening. You may have this screening every year starting at age 79 if you have a 30-pack-year history of smoking and currently smoke or have quit within the past 15 years.  Colorectal cancer screening. All adults should have this screening starting at age 66 and continuing until age 39. Your health care provider may recommend screening at age 51 if you are at increased risk. You will have tests every 1-10 years, depending on your results and the type of screening test.  Diabetes screening. This is done by checking  your blood sugar (glucose) after you have not eaten for a while (fasting). You may have this done every 1-3 years.  Mammogram. This may be done every 1-2 years. Talk with your health care provider about when you should start having regular mammograms. This may depend on whether you have a family history of breast cancer.  BRCA-related cancer  screening. This may be done if you have a family history of breast, ovarian, tubal, or peritoneal cancers.  Pelvic exam and Pap test. This may be done every 3 years starting at age 63. Starting at age 55, this may be done every 5 years if you have a Pap test in combination with an HPV test. Other tests  Sexually transmitted disease (STD) testing.  Bone density scan. This is done to screen for osteoporosis. You may have this scan if you are at high risk for osteoporosis. Follow these instructions at home: Eating and drinking  Eat a diet that includes fresh fruits and vegetables, whole grains, lean protein, and low-fat dairy.  Take vitamin and mineral supplements as recommended by your health care provider.  Do not drink alcohol if: ? Your health care provider tells you not to drink. ? You are pregnant, may be pregnant, or are planning to become pregnant.  If you drink alcohol: ? Limit how much you have to 0-1 drink a day. ? Be aware of how much alcohol is in your drink. In the [place] one drink equals one 12 oz bottle of beer (355 mL), one 5 oz glass of wine (148 mL), or one 1 oz glass of hard liquor (44 mL). Lifestyle  Take daily care of your teeth and gums.  Stay active. Exercise for at least 30 minutes on 5 or more days each week.  Do not use any products that contain nicotine or tobacco, such as cigarettes, e-cigarettes, and chewing tobacco. If you need help quitting, ask your health care provider.  If you are sexually active, practice safe sex. Use a condom or other form of birth control (contraception) in order to prevent pregnancy and STIs (sexually transmitted infections).  If told by your health care provider, take low-dose aspirin daily starting at age 73. What's next?  Visit your health care provider once a year for a well check visit.  Ask your health care provider how often you should have your eyes and teeth checked.  Stay up to date on all vaccines. This  information is not intended to replace advice given to you by your health care provider. Make sure you discuss any questions you have with your health care provider. Document Released: 06/17/2015 Document Revised: 01/30/2018 Document Reviewed: 01/30/2018 Elsevier Patient Education  2020 [employer].

## 2019-01-05 NOTE — Assessment & Plan Note (Signed)
Dr Julien Girt of GYN she is going to have both ovaries and fallopian tubes removed this year.

## 2019-01-05 NOTE — Assessment & Plan Note (Signed)
Encouraged heart healthy diet, increase exercise, avoid trans fats, consider a krill oil cap daily 

## 2019-01-05 NOTE — Progress Notes (Signed)
Established Patient Office Visit  Subjective:  Patient ID: Ebony [name], female    DOB: [birth year]  Age: 53 y.o. MRN: [id number]  CC:  Chief Complaint  Patient presents with  . Annual Exam    HPI Ebony [name] presents for annual preventative exam and follow up on chronic medical concerns including hypothyrodism, obesity,SVT. She has had a cardiac ablation recently with Dr Lovena Le and she has had no further episodes. shehad a great deal of stress this year as her son had a brain tumor last year that had to be surgically removed and it is not cancerous there is a risk to it growing back. They are monitoring closely. Denies CP/palp/SOB/HA/congestion/fevers/GI or GU c/o. Taking meds as prescribed  Past Medical History:  Diagnosis Date  . Arthritis   . BCC (basal cell carcinoma), face    skin   . Concussion with no loss of consciousness 12/28/2014  . Depression with anxiety 10/06/2012  . Diabetes mellitus without complication (HCC)    no meds  . GERD (gastroesophageal reflux disease) 09/05/2015  . Headache(784.0)    otc meds prn, migraine  . Hyperlipidemia, mixed 12/28/2014  . Hypertension    only with preg. 39yrs ago 2002- no meds  . Hypothyroid 06/15/2012  . Obesity 09/05/2015  . Post-operative nausea and vomiting   . SVT (supraventricular tachycardia) ([place])   . Tinea pedis 12/28/2014    Past Surgical History:  Procedure Laterality Date  . ABDOMINAL HYSTERECTOMY  03/04/2012   Procedure: HYSTERECTOMY ABDOMINAL;  Surgeon: Marylynn Pearson, MD;  Location: [facility];  Service: Gynecology;  Laterality: N/A;  with lysis of adhesions  . BASAL CELL CARCINOMA EXCISION Left    cheek/face  . CESAREAN SECTION  2002   HELLP Syndrome/ one time  . DILATION AND CURETTAGE OF UTERUS  2007   Miscarriage  . LAPAROSCOPIC TUBAL LIGATION  01/03/2011   Procedure: LAPAROSCOPIC TUBAL LIGATION;  Surgeon: Marylynn Pearson;  Location: [facility];  Service: Gynecology;  Laterality: Bilateral;  with filshie clips   . LAPAROSCOPY  03/04/2012   Procedure: LAPAROSCOPY DIAGNOSTIC;  Surgeon: Marylynn Pearson, MD;  Location: [facility];  Service: Gynecology;  Laterality: N/A;  with lysis of adhesions  . SHOULDER ARTHROSCOPY  2012   right  . SVT ABLATION N/A 12/17/2018   Procedure: SVT ABLATION;  Surgeon: Evans Lance, MD;  Location: [facility];  Service: Cardiovascular;  Laterality: N/A;  . TONSILLECTOMY      Family History  Problem Relation Age of Onset  . Hypertension Mother   . Depression Mother   . Breast cancer Mother 82  . Lung cancer Mother        lung (06-13-11)  . Basal cell carcinoma Brother 40  . Other Maternal Grandmother        spinal stenosis  . Emphysema Maternal Grandfather   . Prostate cancer Maternal Grandfather   . Breast cancer Cousin 79  . Colon cancer Neg Hx   . Esophageal cancer Neg Hx     Social History   Socioeconomic History  . Marital status: Married    Spouse name: Not on file  . Number of children: 1  . Years of education: Not on file  . 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 Financial resource strain: Not on file  . Food insecurity    Worry: Not on file    Inability: Not on file  . Transportation  needs    Medical: Not on file    Non-medical: Not on file  Tobacco Use  . Smoking status: Never Smoker  . Smokeless tobacco: Never Used  Substance and Sexual Activity  . Alcohol use: Yes    Comment: occasionally   . Drug use: No  . Sexual activity: Yes    Birth control/protection: Surgical  Lifestyle  . Physical activity    Days per week: Not on file    Minutes per session: Not on file  . Stress: Not on file  Relationships  . Social Herbalist on phone: Not on file    Gets together: Not on file    Attends religious service: Not on file    Active member of club or organization: Not on file    Attends meetings of clubs or organizations: Not on file    Relationship status: Not  on file  . Intimate partner violence    Fear of current or ex partner: Not on file    Emotionally abused: Not on file    Physically abused: Not on file    Forced sexual activity: Not on file  Other Topics Concern  . Not on file  Social History Narrative  . Not on file    Outpatient Medications Prior to Visit  Medication Sig Dispense Refill  . Ascorbic Acid (VITAMIN C) 1000 MG tablet Take 1,000 mg by mouth every evening.    . Cholecalciferol (VITAMIN D) 50 MCG (2000 UT) tablet Take 2,000 Units by mouth daily.    Marland Kitchen diltiazem (CARDIZEM CD) 300 MG 24 hr capsule Take 1 capsule (300 mg total) by mouth daily. (Patient taking differently: Take 300 mg by mouth at bedtime. ) 30 capsule 5  . ibuprofen (ADVIL) 200 MG tablet Take 400-600 mg by mouth every 6 (six) hours as needed for headache or moderate pain.    Marland Kitchen levothyroxine (SYNTHROID, LEVOTHROID) 75 MCG tablet TAKE 1 TABLET BY MOUTH DAILY BEFORE BREAKFAST. (Patient taking differently: Take 75 mcg by mouth daily before breakfast. ) 90 tablet 3  . Naproxen Sod-diphenhydrAMINE (ALEVE PM) 220-25 MG TABS Take 1 tablet by mouth at bedtime as needed (sleep).    . pantoprazole (PROTONIX) 40 MG tablet Take 1 tablet (40 mg total) by mouth 2 (two) times daily. 90 tablet 1  . venlafaxine XR (EFFEXOR-XR) 75 MG 24 hr capsule TAKE ONE CAPSULE BY MOUTH EVERY DAY WITH BREAKFAST (Patient taking differently: Take 75 mg by mouth daily. ) 30 capsule 5  . 0.9 %  sodium chloride infusion      No facility-administered medications prior to visit.     No Known Allergies  ROS Review of Systems  Constitutional: Negative for appetite change, chills and fever.  HENT: Negative for congestion and hearing loss.   Eyes: Negative for discharge.  Respiratory: Negative for cough and shortness of breath.   Cardiovascular: Negative for chest pain, palpitations and leg swelling.  Gastrointestinal: Negative for abdominal pain, blood in stool, constipation, diarrhea, nausea and  vomiting.  Genitourinary: Negative for dysuria, frequency, hematuria and urgency.  Musculoskeletal: Negative for back pain and myalgias.  Skin: Negative for rash.  Allergic/Immunologic: Negative for environmental allergies.  Neurological: Negative for dizziness, weakness and headaches.  Hematological: Does not bruise/bleed easily.  Psychiatric/Behavioral: Negative for suicidal ideas. The patient is not nervous/anxious.       Objective:    Physical Exam  Constitutional: She is oriented to person, place, and time. She appears well-developed and well-nourished. No  distress.  HENT:  Head: Normocephalic and atraumatic.  Eyes: Conjunctivae are normal.  Neck: Neck supple. No thyromegaly present.  Cardiovascular: Normal rate, regular rhythm and normal heart sounds.  No murmur heard. Pulmonary/Chest: Effort normal and breath sounds normal. No respiratory distress.  Abdominal: Soft. Bowel sounds are normal. She exhibits no distension and no mass. There is no abdominal tenderness.  Musculoskeletal:        General: No edema.  Lymphadenopathy:    She has no cervical adenopathy.  Neurological: She is alert and oriented to person, place, and time.  Skin: Skin is warm and dry.  Psychiatric: She has a normal mood and affect. Her behavior is normal.    BP 137/77   Pulse 72   Temp (!) 97.5 F (36.4 C) (Oral)   Resp 16   Ht 5\' 5"  (1.651 m)   Wt 224 lb 4.8 oz (101.7 kg)   LMP 12/15/2010   SpO2 99%   BMI 37.33 kg/m  Wt Readings from Last 3 Encounters:  01/05/19 224 lb 4.8 oz (101.7 kg)  12/17/18 220 lb (99.8 kg)  11/10/18 225 lb (102.1 kg)     Health Maintenance Due  Topic Date Due  . PNEUMOCOCCAL POLYSACCHARIDE VACCINE AGE 10-64 HIGH RISK  01/26/1970  . HIV Screening  01/27/1983  . FOOT EXAM  06/19/2016  . URINE MICROALBUMIN  03/22/2017  . PAP SMEAR-Modifier  04/22/2017  . HEMOGLOBIN A1C  09/27/2018  . INFLUENZA VACCINE  01/03/2019    There are no preventive care reminders to  display for this patient.  Lab Results  Component Value Date   TSH 1.94 01/05/2019   Lab Results  Component Value Date   WBC 7.3 12/15/2018   HGB 13.9 12/15/2018   HCT 42.5 12/15/2018   MCV 88 12/15/2018   PLT 323 12/15/2018   Lab Results  Component Value Date   NA 142 12/15/2018   K 4.4 12/15/2018   CO2 22 12/15/2018   GLUCOSE 95 12/15/2018   BUN 21 12/15/2018   CREATININE 0.90 12/15/2018   BILITOT 0.4 03/28/2018   ALKPHOS 65 03/28/2018   AST 18 03/28/2018   ALT 15 03/28/2018   PROT 7.0 03/28/2018   ALBUMIN 4.4 03/28/2018   CALCIUM 10.0 12/15/2018   GFR 81.82 03/28/2018   Lab Results  Component Value Date   CHOL 208 (H) 01/05/2019   Lab Results  Component Value Date   HDL 55.30 01/05/2019   Lab Results  Component Value Date   LDLCALC 119 (H) 01/05/2019   Lab Results  Component Value Date   TRIG 164.0 (H) 01/05/2019   Lab Results  Component Value Date   CHOLHDL 4 01/05/2019   Lab Results  Component Value Date   HGBA1C 6.3 01/05/2019      Assessment & Plan:   Problem List Items Addressed This Visit    Preventative health care    Patient encouraged to maintain heart healthy diet, regular exercise, adequate sleep. Consider daily probiotics. Take medications as prescribed. Las ordered and reviewed       Hypothyroid - Primary   Relevant Orders   TSH (Completed)   Hyperlipidemia, mixed    Encouraged heart healthy diet, increase exercise, avoid trans fats, consider a krill oil cap daily      Relevant Orders   Lipid panel (Completed)   Type 2 diabetes mellitus without complication, without long-term current use of insulin (HCC)    hgba1c acceptable, minimize simple carbs. Increase exercise as tolerated. Continue current meds  Relevant Orders   Hemoglobin A1c (Completed)   Ovarian cancer genetic susceptibility    Dr Julien Girt of GYN she is going to have both ovaries and fallopian tubes removed this year.          No orders of the defined  types were placed in this encounter.   Follow-up: No follow-ups on file.    Penni Homans, MD

## 2019-01-05 NOTE — Assessment & Plan Note (Signed)
Patient encouraged to maintain heart healthy diet, regular exercise, adequate sleep. Consider daily probiotics. Take medications as prescribed. Las ordered and reviewed.  

## 2019-01-05 NOTE — Assessment & Plan Note (Signed)
hgba1c acceptable, minimize simple carbs. Increase exercise as tolerated. Continue current meds 

## 2019-01-07 ENCOUNTER — Other Ambulatory Visit: Payer: Self-pay | Admitting: Gynecologic Oncology

## 2019-01-07 DIAGNOSIS — Z1502 Genetic susceptibility to malignant neoplasm of ovary: Secondary | ICD-10-CM

## 2019-01-07 NOTE — Progress Notes (Signed)
[name] ordered due to ovarian cancer susceptibility gene.

## 2019-01-12 ENCOUNTER — Telehealth: Payer: Self-pay

## 2019-01-12 NOTE — Telephone Encounter (Signed)
Told Ebony [name] that Ebony [name] said to get the US done and then pending the results will set up a visit with one of the providers as Ebony [name] is no longer with the practice. The [name] is scheduled for 01-23-19 at [place] at 1115 with a full bladder.

## 2019-01-15 ENCOUNTER — Telehealth: Payer: Self-pay | Admitting: *Deleted

## 2019-01-16 NOTE — Telephone Encounter (Signed)
error 

## 2019-01-23 ENCOUNTER — Encounter: Payer: Self-pay | Admitting: Internal Medicine

## 2019-01-23 ENCOUNTER — Other Ambulatory Visit: Payer: Self-pay

## 2019-01-23 ENCOUNTER — Ambulatory Visit: Payer: 59 | Admitting: Internal Medicine

## 2019-01-23 ENCOUNTER — Telehealth: Payer: Self-pay

## 2019-01-23 ENCOUNTER — Ambulatory Visit (HOSPITAL_COMMUNITY)
Admission: RE | Admit: 2019-01-23 | Discharge: 2019-01-23 | Disposition: A | Discharge status: Home / Self Care | Payer: 59 | Source: Ambulatory Visit | Attending: Gynecologic Oncology | Admitting: Gynecologic Oncology

## 2019-01-23 VITALS — BP 120/86 | HR 69 | Ht 65.0 in | Wt 225.0 lb

## 2019-01-23 DIAGNOSIS — I471 Supraventricular tachycardia: Secondary | ICD-10-CM

## 2019-01-23 DIAGNOSIS — Z1502 Genetic susceptibility to malignant neoplasm of ovary: Secondary | ICD-10-CM | POA: Diagnosis present

## 2019-01-23 NOTE — Telephone Encounter (Signed)
I spoke with Ms Emmanuel regarding the results of her ultrasound. Left ovary was normal however the right ovary was not visualized. Pt to see Dr. Denman George 02/04/2019 at 1200. Pt verbalized understanding.

## 2019-01-23 NOTE — Patient Instructions (Signed)
Medication Instructions:    Begin to titrate down your diltazem. Take every other day for the next two weeks, then discontinue.  Labwork: None ordered.  Testing/Procedures: None ordered.  Follow-Up: Your physician recommends that you schedule a follow-up appointment as needed with Dr. Lovena Le.   Any Other Special Instructions Will Be Listed Below (If Applicable).     If you need a refill on your cardiac medications before your next appointment, please call your pharmacy.

## 2019-01-23 NOTE — Progress Notes (Signed)
HPI [name] returns today for followup. She has a h/o SVT s/p catheter ablation of AVNRT several months ago. We kept her on her diltiazem as she had some palpitations. She feels well. She has not had chest pain, sob, or recurrent SVT. No edema.  No Known Allergies   Current Outpatient Medications  Medication Sig Dispense Refill  . Ascorbic Acid (VITAMIN C) 1000 MG tablet Take 1,000 mg by mouth every evening.    . Cholecalciferol (VITAMIN D) 50 MCG (2000 UT) tablet Take 2,000 Units by mouth daily.    Marland Kitchen diltiazem (CARDIZEM CD) 300 MG 24 hr capsule Take 1 capsule (300 mg total) by mouth daily. (Patient taking differently: Take 300 mg by mouth at bedtime. ) 30 capsule 5  . ibuprofen (ADVIL) 200 MG tablet Take 400-600 mg by mouth every 6 (six) hours as needed for headache or moderate pain.    Marland Kitchen levothyroxine (SYNTHROID, LEVOTHROID) 75 MCG tablet TAKE 1 TABLET BY MOUTH DAILY BEFORE BREAKFAST. (Patient taking differently: Take 75 mcg by mouth daily before breakfast. ) 90 tablet 3  . Naproxen Sod-diphenhydrAMINE (ALEVE PM) 220-25 MG TABS Take 1 tablet by mouth at bedtime as needed (sleep).    . pantoprazole (PROTONIX) 40 MG tablet Take 1 tablet (40 mg total) by mouth 2 (two) times daily. 90 tablet 1  . venlafaxine XR (EFFEXOR-XR) 75 MG 24 hr capsule TAKE ONE CAPSULE BY MOUTH EVERY DAY WITH BREAKFAST (Patient taking differently: Take 75 mg by mouth daily. ) 30 capsule 5   No current facility-administered medications for this visit.      Past Medical History:  Diagnosis Date  . Arthritis   . BCC (basal cell carcinoma), face    skin   . Concussion with no loss of consciousness 12/28/2014  . Depression with anxiety 10/06/2012  . Diabetes mellitus without complication (HCC)    no meds  . GERD (gastroesophageal reflux disease) 09/05/2015  . Headache(784.0)    otc meds prn, migraine  . Hyperlipidemia, mixed 12/28/2014  . Hypertension    only with preg. 68yrs ago 2002- no meds  .  Hypothyroid 06/15/2012  . Obesity 09/05/2015  . Post-operative nausea and vomiting   . SVT (supraventricular tachycardia) ([place])   . Tinea pedis 12/28/2014    ROS:   All systems reviewed and negative except as noted in the HPI.   Past Surgical History:  Procedure Laterality Date  . ABDOMINAL HYSTERECTOMY  03/04/2012   Procedure: HYSTERECTOMY ABDOMINAL;  Surgeon: Marylynn Pearson, MD;  Location: [facility];  Service: Gynecology;  Laterality: N/A;  with lysis of adhesions  . BASAL CELL CARCINOMA EXCISION Left    cheek/face  . CESAREAN SECTION  2002   HELLP Syndrome/ one time  . DILATION AND CURETTAGE OF UTERUS  2007   Miscarriage  . LAPAROSCOPIC TUBAL LIGATION  01/03/2011   Procedure: LAPAROSCOPIC TUBAL LIGATION;  Surgeon: Marylynn Pearson;  Location: [facility];  Service: Gynecology;  Laterality: Bilateral;  with filshie clips  . LAPAROSCOPY  03/04/2012   Procedure: LAPAROSCOPY DIAGNOSTIC;  Surgeon: Marylynn Pearson, MD;  Location: [facility];  Service: Gynecology;  Laterality: N/A;  with lysis of adhesions  . SHOULDER ARTHROSCOPY  2012   right  . SVT ABLATION N/A 12/17/2018   Procedure: SVT ABLATION;  Surgeon: Evans Lance, MD;  Location: [facility];  Service: Cardiovascular;  Laterality: N/A;  . TONSILLECTOMY       Family History  Problem Relation Age of Onset  .  Hypertension Mother   . Depression Mother   . Breast cancer Mother 36  . Lung cancer Mother        lung (06-13-11)  . Basal cell carcinoma Brother 40  . Other Maternal Grandmother        spinal stenosis  . Emphysema Maternal Grandfather   . Prostate cancer Maternal Grandfather   . Breast cancer Cousin 48  . Colon cancer Neg Hx   . Esophageal cancer Neg Hx      Social History   Socioeconomic History  . Marital status: Married    Spouse name: Not on file  . Number of children: 1  . Years of education: Not on file  . 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 Financial resource strain: Not on file  . Food insecurity    Worry: Not on file    Inability: Not on file  . Transportation needs    Medical: Not on file    Non-medical: Not on file  Tobacco Use  . Smoking status: Never Smoker  . Smokeless tobacco: Never Used  Substance and Sexual Activity  . Alcohol use: Yes    Comment: occasionally   . Drug use: No  . Sexual activity: Yes    Birth control/protection: Surgical  Lifestyle  . Physical activity    Days per week: Not on file    Minutes per session: Not on file  . Stress: Not on file  Relationships  . Social Herbalist on phone: Not on file    Gets together: Not on file    Attends religious service: Not on file    Active member of club or organization: Not on file    Attends meetings of clubs or organizations: Not on file    Relationship status: Not on file  . Intimate partner violence    Fear of current or ex partner: Not on file    Emotionally abused: Not on file    Physically abused: Not on file    Forced sexual activity: Not on file  Other Topics Concern  . Not on file  Social History Narrative  . Not on file     BP 120/86   Pulse 69   Ht 5\' 5"  (1.651 m)   Wt 225 lb (102.1 kg)   LMP 12/15/2010   SpO2 97%   BMI 37.44 kg/m   Physical Exam:  Well appearing middle aged woman, NAD HEENT: Unremarkable Neck:  No JVD, no thyromegally Lymphatics:  No adenopathy Back:  No CVA tenderness Lungs:  Clear with no wheezes HEART:  Regular rate rhythm, no murmurs, no rubs, no clicks Abd:  soft, positive bowel sounds, no organomegally, no rebound, no guarding Ext:  2 plus pulses, no edema, no cyanosis, no clubbing Skin:  No rashes no nodules Neuro:  CN II through XII intact, motor grossly intact  EKG - nsr  Assess/Plan: 1. SVT -she is doing well s/p EPS/RFA of AVNRT. She will wean off her cardizem. We will see her back as needed. 2. HTN - unclear if she really even has HTN. Her BP is normal on Cardizem.  If her bp goes up as she weans off of the cardizem then she will either need to restart the cardizem or take something else for her bp.  Mikle Bosworth.D.

## 2019-02-04 ENCOUNTER — Encounter: Payer: Self-pay | Admitting: Gynecologic Oncology

## 2019-02-04 ENCOUNTER — Other Ambulatory Visit: Payer: Self-pay | Admitting: Gynecologic Oncology

## 2019-02-04 ENCOUNTER — Other Ambulatory Visit: Payer: Self-pay

## 2019-02-04 ENCOUNTER — Inpatient Hospital Stay: Payer: 59 | Attending: Gynecologic Oncology | Admitting: Gynecologic Oncology

## 2019-02-04 VITALS — BP 131/89 | HR 84 | Temp 98.7°F | Resp 18 | Ht 65.0 in | Wt 228.6 lb

## 2019-02-04 DIAGNOSIS — E782 Mixed hyperlipidemia: Secondary | ICD-10-CM | POA: Diagnosis not present

## 2019-02-04 DIAGNOSIS — Z148 Genetic carrier of other disease: Secondary | ICD-10-CM

## 2019-02-04 DIAGNOSIS — I10 Essential (primary) hypertension: Secondary | ICD-10-CM | POA: Insufficient documentation

## 2019-02-04 DIAGNOSIS — K219 Gastro-esophageal reflux disease without esophagitis: Secondary | ICD-10-CM | POA: Diagnosis not present

## 2019-02-04 DIAGNOSIS — Z1502 Genetic susceptibility to malignant neoplasm of ovary: Secondary | ICD-10-CM

## 2019-02-04 DIAGNOSIS — Z79899 Other long term (current) drug therapy: Secondary | ICD-10-CM | POA: Insufficient documentation

## 2019-02-04 DIAGNOSIS — M1990 Unspecified osteoarthritis, unspecified site: Secondary | ICD-10-CM | POA: Diagnosis not present

## 2019-02-04 DIAGNOSIS — I471 Supraventricular tachycardia: Secondary | ICD-10-CM | POA: Insufficient documentation

## 2019-02-04 DIAGNOSIS — E119 Type 2 diabetes mellitus without complications: Secondary | ICD-10-CM | POA: Diagnosis not present

## 2019-02-04 DIAGNOSIS — E669 Obesity, unspecified: Secondary | ICD-10-CM | POA: Diagnosis not present

## 2019-02-04 DIAGNOSIS — Z90710 Acquired absence of both cervix and uterus: Secondary | ICD-10-CM | POA: Insufficient documentation

## 2019-02-04 DIAGNOSIS — E039 Hypothyroidism, unspecified: Secondary | ICD-10-CM | POA: Diagnosis not present

## 2019-02-04 DIAGNOSIS — F418 Other specified anxiety disorders: Secondary | ICD-10-CM | POA: Insufficient documentation

## 2019-02-04 DIAGNOSIS — Z85828 Personal history of other malignant neoplasm of skin: Secondary | ICD-10-CM | POA: Diagnosis not present

## 2019-02-04 DIAGNOSIS — Z803 Family history of malignant neoplasm of breast: Secondary | ICD-10-CM | POA: Diagnosis not present

## 2019-02-04 MED ORDER — SENNOSIDES-DOCUSATE SODIUM 8.6-50 MG PO TABS: 2.0000 | ORAL_TABLET | Freq: Every day | ORAL | 0 refills | Status: DC

## 2019-02-04 MED ORDER — IBUPROFEN 800 MG PO TABS: 800.0000 mg | ORAL_TABLET | Freq: Three times a day (TID) | ORAL | 0 refills | Status: DC | PRN

## 2019-02-04 MED ORDER — OXYCODONE HCL 5 MG PO TABS: 5.0000 mg | ORAL_TABLET | ORAL | 0 refills | Status: DC | PRN

## 2019-02-04 NOTE — Progress Notes (Signed)
Follow-up Note: Gyn-Onc  Consult was requested by Dr. Marylynn Pearson  CC:  Chief Complaint  Patient presents with  . BRP1 gene mutation    HPI: [name]  is a very nice 53 y.o. P1  The patient presented for well woman exam with Dr. Marylynn Pearson.  Notable was her family history of breast cancer.  Genetic testing was ordered on her visit February 2019.  [facility] my risk panel testing revealed a mutation in BRIP1. 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  She has already received these results and had genetic counseling with both Dr. Julien Girt and through [facility] genetic counselor.  She wishes to proceed with prophylactic RRSO.  She has had an ultrasound dated 09/25/2017 showing no adnexal mass no fluid in the cul-de-sac surgically absent uterus.  A Ca1 25 was also obtained with Dr. Julien Girt equal to 6 on 09/16/2017.  Interval Hx:  Her son was diagnosed with a brain tumor (and is now doing well), but she postponed her surgery due to that concern in 2019. She is now ready to proceed.  In July she had a cardiac ablation for SVT and has had no further episodes of SVT.    Measurement of disease:  Ca125 used a screening tool . 09/16/2017 = 6  Radiology: . 09/25/2017-transvaginal ultrasound-physician for women of [place]-no adnexal masses.  Current Meds:  Outpatient Encounter Medications as of 02/04/2019  Medication Sig  . Ascorbic Acid (VITAMIN C) 1000 MG tablet Take 1,000 mg by mouth every evening.  . Cholecalciferol (VITAMIN D) 50 MCG (2000 UT) tablet Take 2,000 Units by mouth daily.  Marland Kitchen ibuprofen (ADVIL) 200 MG tablet Take 400-600 mg by mouth every 6 (six) hours as needed for headache or moderate pain.  Marland Kitchen levothyroxine (SYNTHROID, LEVOTHROID) 75 MCG tablet TAKE 1 TABLET BY MOUTH DAILY BEFORE BREAKFAST. (Patient taking differently: Take 75 mcg by mouth daily before breakfast. )  . Naproxen  Sod-diphenhydrAMINE (ALEVE PM) 220-25 MG TABS Take 1 tablet by mouth at bedtime as needed (sleep).  . pantoprazole (PROTONIX) 40 MG tablet Take 1 tablet (40 mg total) by mouth 2 (two) times daily.  Marland Kitchen venlafaxine XR (EFFEXOR-XR) 75 MG 24 hr capsule TAKE ONE CAPSULE BY MOUTH EVERY DAY WITH BREAKFAST (Patient taking differently: Take 75 mg by mouth daily. )   No facility-administered encounter medications on file as of 02/04/2019.     Allergy: No Known Allergies  Social Hx:   Social History   Socioeconomic History  . Marital status: Married    Spouse name: Not on file  . Number of children: 1  . Years of education: Not on file  . 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 Financial resource strain: Not on file  . Food insecurity    Worry: Not on file    Inability: Not on file  . Transportation needs    Medical: Not on file    Non-medical: Not on file  Tobacco Use  . Smoking status: Never Smoker  . Smokeless tobacco: Never Used  Substance and Sexual Activity  . Alcohol use: Yes    Comment: occasionally   . Drug use: No  . Sexual activity: Yes    Birth control/protection: Surgical  Lifestyle  . Physical activity    Days per week: Not on file    Minutes per session: Not on file  . Stress: Not on  file  Relationships  . Social Herbalist on phone: Not on file    Gets together: Not on file    Attends religious service: Not on file    Active member of club or organization: Not on file    Attends meetings of clubs or organizations: Not on file    Relationship status: Not on file  . Intimate partner violence    Fear of current or ex partner: Not on file    Emotionally abused: Not on file    Physically abused: Not on file    Forced sexual activity: Not on file  Other Topics Concern  . Not on file  Social History Narrative  . Not on file    Past Surgical Hx:  Past Surgical History:  Procedure Laterality  Date  . ABDOMINAL HYSTERECTOMY  03/04/2012   Procedure: HYSTERECTOMY ABDOMINAL;  Surgeon: Marylynn Pearson, MD;  Location: [facility];  Service: Gynecology;  Laterality: N/A;  with lysis of adhesions  . BASAL CELL CARCINOMA EXCISION Left    cheek/face  . CESAREAN SECTION  2002   HELLP Syndrome/ one time  . DILATION AND CURETTAGE OF UTERUS  2007   Miscarriage  . LAPAROSCOPIC TUBAL LIGATION  01/03/2011   Procedure: LAPAROSCOPIC TUBAL LIGATION;  Surgeon: Marylynn Pearson;  Location: [facility];  Service: Gynecology;  Laterality: Bilateral;  with filshie clips  . LAPAROSCOPY  03/04/2012   Procedure: LAPAROSCOPY DIAGNOSTIC;  Surgeon: Marylynn Pearson, MD;  Location: [facility];  Service: Gynecology;  Laterality: N/A;  with lysis of adhesions  . SHOULDER ARTHROSCOPY  2012   right  . SVT ABLATION N/A 12/17/2018   Procedure: SVT ABLATION;  Surgeon: Evans Lance, MD;  Location: [facility];  Service: Cardiovascular;  Laterality: N/A;  . TONSILLECTOMY      Past Medical Hx:  Past Medical History:  Diagnosis Date  . Arthritis   . BCC (basal cell carcinoma), face    skin   . Concussion with no loss of consciousness 12/28/2014  . Depression with anxiety 10/06/2012  . Diabetes mellitus without complication (HCC)    no meds  . GERD (gastroesophageal reflux disease) 09/05/2015  . Headache(784.0)    otc meds prn, migraine  . Hyperlipidemia, mixed 12/28/2014  . Hypertension    only with preg. 66yr ago 2002- no meds  . Hypothyroid 06/15/2012  . Obesity 09/05/2015  . Post-operative nausea and vomiting   . SV[place] ([place]ricular tachycardia) [place]   . Tinea pedis 12/28/2014    Past Gynecological History:   GYNECOLOGIC HISTORY:  Patient's last menstrual period was 12/15/2010. Menarche: 53years old P 1 LMP 2013 at time of hysterectomy Contraceptive oral contraceptives approximately 10 years HRT none  Last Pap states January 2019 however hysterectomy was performed for non-dysplastic reasons.  Family Hx:   Family History  Problem Relation Age of Onset  . Hypertension Mother   . Depression Mother   . Breast cancer Mother 537 . Lung cancer Mother        lung (06-13-11)  . Basal cell carcinoma Brother 40  . Other Maternal Grandmother        spinal stenosis  . Emphysema Maternal Grandfather   . Prostate cancer Maternal Grandfather   . Breast cancer Cousin 460 . Colon cancer Neg Hx   . Esophageal cancer Neg Hx     Review of Systems:  Review of Systems  Constitutional: Negative.   HENT:  Negative.   Eyes: Negative.  Respiratory: Negative.   Cardiovascular: Negative.   Gastrointestinal: Negative.   Endocrine: Negative.   Genitourinary: Negative.    Musculoskeletal: Negative.   Skin: Negative.   Neurological: Negative.   Hematological: Negative.   Psychiatric/Behavioral: Negative.      Vitals:  Blood pressure 131/89, pulse 84, temperature 98.7 F (37.1 C), temperature source Oral, resp. rate 18, height '5\' 5"'  (1.651 m), weight 228 lb 9.6 oz (103.7 kg), last menstrual period 12/15/2010, SpO2 97 %. Body mass index is 38.04 kg/m.   Physical Exam: ECOG PERFORMANCE STATUS: 0 - Asymptomatic   General :  Well developed, 53 y.o., female in no apparent distress HEENT:  Normocephalic/atraumatic, symmetric, EOMI, eyelids normal Neck:   Supple, no masses.  Lymphatics:  No cervical/ submandibular/ supraclavicular/ infraclavicular/ inguinal adenopathy Respiratory:  Respirations unlabored, no use of accessory muscles CV:   Deferred Breast:  Deferred Musculoskeletal: No CVA tenderness, normal muscle strength. Abdomen:  Overweight, soft, non-tender and nondistended. No evidence of hernia. No masses. Extremities:  No lymphedema, no erythema, non-tender. Skin:   Normal inspection Neuro/Psych:  No focal motor deficit, no abnormal mental status. Normal gait. Normal affect. Alert and oriented to person, place, and time  Genito Urinary: Vulva: Normal external female genitalia.   Bladder/urethra: Urethral meatus normal in size and location. No lesions or   masses, well supported bladder Speculum exam: Vagina: No lesion, no discharge, no bleeding. Cervix: Surgically absent lesions. Bimanual exam:  Uterus: Surgically absent  Adnexa: No masses. Rectovaginal:  Deferred  Oncologic Summary: 1. Genetic predisposition to breast and ovarian cancer   Assessment/Plan: Genetic predisposition to ovarian cancer with BRIP1 gene mutation.  The recommendation is for risk reducing surgery with robotic assisted BSO.  I explained the alternative would be close surveillance with ultrasounds and Ca1 25 assessments.  She is desiring to proceed with definitive surgery.  Her surgery will likely be complicated somewhat due to her history of adhesive disease.  I explained the increased risk of damage to adjacent visceral structures.  It is still most likely will be able achieve this to a minimally invasive incision with outpatient surgery.  I discussed anticipated surgical stay and recovery.  I explained anticipated postoperative expectations.  [name], MD  02/04/2019, 12:38 PM  Cc: Marylynn Pearson, MD (Referring OB/GYN) Vivien Rossetti, MD (PCP)

## 2019-02-04 NOTE — Patient Instructions (Signed)
Preparing for your Surgery  Plan for surgery on February 17, 2019 with Dr. Everitt Amber at [facility] will be scheduled for a robotic assisted bilateral salpingo-oophorectomy, lysis of adhesions.   Pre-operative Testing -You will receive a phone call from presurgical testing at [facility] if you have not received a call already to arrange for a pre-operative testing appointment before your surgery.  This appointment normally occurs one to two weeks before your scheduled surgery.   -Bring your insurance card, copy of an advanced directive if applicable, medication list  -At that visit, you will be asked to sign a consent for a possible blood transfusion in case a transfusion becomes necessary during surgery.  The need for a blood transfusion is rare but having consent is a necessary part of your care.     -You should not be taking blood thinners or aspirin at least ten days prior to surgery unless instructed by your surgeon.  -Do not take supplements such as fish oil (omega 3), red yeast rice, tumeric before your surgery.  Day Before Surgery at [place] will be asked to take in a light diet the day before surgery.  Avoid carbonated beverages.  You will be advised to have nothing to eat or drink after midnight the evening before.    Eat a light diet the day before surgery.  Examples including soups, broths, toast, yogurt, mashed potatoes.  Things to avoid include carbonated beverages (fizzy beverages), raw fruits and raw vegetables, or beans.   If your bowels are filled with gas, your surgeon will have difficulty visualizing your pelvic organs which increases your surgical risks.  Your role in recovery Your role is to become active as soon as directed by your doctor, while still giving yourself time to heal.  Rest when you feel tired. You will be asked to do the following in order to speed your recovery:  - Cough and breathe deeply. This helps toclear and expand your  lungs and can prevent pneumonia. You may be given a spirometer to practice deep breathing. A staff member will show you how to use the spirometer. - Do mild physical activity. Walking or moving your legs help your circulation and body functions return to normal. A staff member will help you when you try to walk and will provide you with simple exercises. Do not try to get up or walk alone the first time. - Actively manage your pain. Managing your pain lets you move in comfort. We will ask you to rate your pain on a scale of zero to 10. It is your responsibility to tell your doctor or nurse where and how much you hurt so your pain can be treated.  Special Considerations -If you are diabetic, you may be placed on insulin after surgery to have closer control over your blood sugars to promote healing and recovery.  This does not mean that you will be discharged on insulin.  If applicable, your oral antidiabetics will be resumed when you are tolerating a solid diet.  -Your final pathology results from surgery should be available around one week after surgery and the results will be relayed to you when available.  -Dr. Lahoma Crocker is the surgeon that assists your GYN Oncologist with surgery.  If you end up staying the night, the next day after your surgery you will either see Dr. Denman George or Dr. Lahoma Crocker.  -FMLA forms can be faxed to [phone] and please allow 5-7 business days for  completion.  Pain Management After Surgery -You have been prescribed your pain medication and bowel regimen medications before surgery so that you can have these available when you are discharged from the hospital. The pain medication is for use ONLY AFTER surgery and a new prescription will not be given.   -Make sure that you have Tylenol and Ibuprofen at home to use on a regular basis after surgery for pain control. We recommend alternating the medications every hour to six hours since they work differently  and are processed in the body differently for pain relief.  -Review the attached handout on narcotic use and their risks and side effects.   Bowel Regimen -You have been prescribed Sennakot-S to take nightly to prevent constipation especially if you are taking the narcotic pain medication intermittently.  It is important to prevent constipation and drink adequate amounts of liquids.  Blood Transfusion Information WHAT IS A BLOOD TRANSFUSION? A transfusion is the replacement of blood or some of its parts. Blood is made up of multiple cells which provide different functions.  Red blood cells carry oxygen and are used for blood loss replacement.  White blood cells fight against infection.  Platelets control bleeding.  Plasma helps clot blood.  Other blood products are available for specialized needs, such as hemophilia or other clotting disorders. BEFORE THE TRANSFUSION  Who gives blood for transfusions?   You may be able to donate blood to be used at a later date on yourself (autologous donation).  Relatives can be asked to donate blood. This is generally not any safer than if you have received blood from a stranger. The same precautions are taken to ensure safety when a relative's blood is donated.  Healthy volunteers who are fully evaluated to make sure their blood is safe. This is blood bank blood. Transfusion therapy is the safest it has ever been in the practice of medicine. Before blood is taken from a donor, a complete history is taken to make sure that person has no history of diseases nor engages in risky social behavior (examples are intravenous drug use or sexual activity with multiple partners). The donor's travel history is screened to minimize risk of transmitting infections, such as malaria. The donated blood is tested for signs of infectious diseases, such as HIV and hepatitis. The blood is then tested to be sure it is compatible with you in order to minimize the chance of a  transfusion reaction. If you or a relative donates blood, this is often done in anticipation of surgery and is not appropriate for emergency situations. It takes many days to process the donated blood. RISKS AND COMPLICATIONS Although transfusion therapy is very safe and saves many lives, the main dangers of transfusion include:   Getting an infectious disease.  Developing a transfusion reaction. This is an allergic reaction to something in the blood you were given. Every precaution is taken to prevent this. The decision to have a blood transfusion has been considered carefully by your caregiver before blood is given. Blood is not given unless the benefits outweigh the risks.  AFTER SURGERY INSTRUCTIONS 02/04/2019  Return to work: 4-6 weeks if applicable  Activity: 1. Be up and out of the bed during the day.  Take a nap if needed.  You may walk up steps but be careful and use the hand rail.  Stair climbing will tire you more than you think, you may need to stop part way and rest.   2. No lifting or straining  for 6 weeks.  3. No driving for 1 week(s).  Do not drive if you are taking narcotic pain medicine.  4. Shower daily.  Use soap and water on your incision and pat dry; don't rub.  No tub baths until cleared by your surgeon.   5. No sexual activity and nothing in the vagina for 2 weeks.  6. You may experience a small amount of clear drainage from your incisions, which is normal.  If the drainage persists or increases, please call the office.  7. Take Tylenol or ibuprofen first for pain and only use Percocet for severe pain not relieved by the Tylenol or Ibuprofen.  Monitor your Tylenol intake to a max of 4,000 mg a day since Percocet has Tylenol in it as well.  Diet: 1. Low sodium Heart Healthy Diet is recommended.  2. It is safe to use a laxative, such as Miralax or Colace, if you have difficulty moving your bowels. You can take Sennakot at bedtime every evening to keep bowel  movements regular and to prevent constipation.    Wound Care: 1. Keep clean and dry.  Shower daily.  Reasons to call the Doctor:  Fever - Oral temperature greater than 100.4 degrees Fahrenheit  Foul-smelling vaginal discharge  Difficulty urinating  Nausea and vomiting  Increased pain at the site of the incision that is unrelieved with pain medicine.  Difficulty breathing with or without chest pain  New calf pain especially if only on one side  Sudden, continuing increased vaginal bleeding with or without clots.   Contacts: For questions or concerns you should contact:  Dr. Everitt Amber at [phone]  Joylene John, NP at [phone]  After Hours: call [phone] and have the GYN Oncologist paged/contacted

## 2019-02-04 NOTE — H&P (View-Only) (Signed)
Follow-up Note: Gyn-Onc  Consult was requested by Dr. Marylynn Pearson  CC:  Chief Complaint  Patient presents with  . BRP1 gene mutation    HPI: [name]  is a very nice 53 y.o. P1  The patient presented for well woman exam with Dr. Marylynn Pearson.  Notable was her family history of breast cancer.  Genetic testing was ordered on her visit February 2019.  [facility] my risk panel testing revealed a mutation in BRIP1. 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  She has already received these results and had genetic counseling with both Dr. Julien Girt and through [facility] genetic counselor.  She wishes to proceed with prophylactic RRSO.  She has had an ultrasound dated 09/25/2017 showing no adnexal mass no fluid in the cul-de-sac surgically absent uterus.  A Ca1 25 was also obtained with Dr. Julien Girt equal to 6 on 09/16/2017.  Interval Hx:  Her son was diagnosed with a brain tumor (and is now doing well), but she postponed her surgery due to that concern in 2019. She is now ready to proceed.  In July she had a cardiac ablation for SVT and has had no further episodes of SVT.    Measurement of disease:  Ca125 used a screening tool . 09/16/2017 = 6  Radiology: . 09/25/2017-transvaginal ultrasound-physician for women of [place]-no adnexal masses.  Current Meds:  Outpatient Encounter Medications as of 02/04/2019  Medication Sig  . Ascorbic Acid (VITAMIN C) 1000 MG tablet Take 1,000 mg by mouth every evening.  . Cholecalciferol (VITAMIN D) 50 MCG (2000 UT) tablet Take 2,000 Units by mouth daily.  Marland Kitchen ibuprofen (ADVIL) 200 MG tablet Take 400-600 mg by mouth every 6 (six) hours as needed for headache or moderate pain.  Marland Kitchen levothyroxine (SYNTHROID, LEVOTHROID) 75 MCG tablet TAKE 1 TABLET BY MOUTH DAILY BEFORE BREAKFAST. (Patient taking differently: Take 75 mcg by mouth daily before breakfast. )  . Naproxen  Sod-diphenhydrAMINE (ALEVE PM) 220-25 MG TABS Take 1 tablet by mouth at bedtime as needed (sleep).  . pantoprazole (PROTONIX) 40 MG tablet Take 1 tablet (40 mg total) by mouth 2 (two) times daily.  Marland Kitchen venlafaxine XR (EFFEXOR-XR) 75 MG 24 hr capsule TAKE ONE CAPSULE BY MOUTH EVERY DAY WITH BREAKFAST (Patient taking differently: Take 75 mg by mouth daily. )   No facility-administered encounter medications on file as of 02/04/2019.     Allergy: No Known Allergies  Social Hx:   Social History   Socioeconomic History  . Marital status: Married    Spouse name: Not on file  . Number of children: 1  . Years of education: Not on file  . 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 Financial resource strain: Not on file  . Food insecurity    Worry: Not on file    Inability: Not on file  . Transportation needs    Medical: Not on file    Non-medical: Not on file  Tobacco Use  . Smoking status: Never Smoker  . Smokeless tobacco: Never Used  Substance and Sexual Activity  . Alcohol use: Yes    Comment: occasionally   . Drug use: No  . Sexual activity: Yes    Birth control/protection: Surgical  Lifestyle  . Physical activity    Days per week: Not on file    Minutes per session: Not on file  . Stress: Not on  file  Relationships  . Social Herbalist on phone: Not on file    Gets together: Not on file    Attends religious service: Not on file    Active member of club or organization: Not on file    Attends meetings of clubs or organizations: Not on file    Relationship status: Not on file  . Intimate partner violence    Fear of current or ex partner: Not on file    Emotionally abused: Not on file    Physically abused: Not on file    Forced sexual activity: Not on file  Other Topics Concern  . Not on file  Social History Narrative  . Not on file    Past Surgical Hx:  Past Surgical History:  Procedure Laterality  Date  . ABDOMINAL HYSTERECTOMY  03/04/2012   Procedure: HYSTERECTOMY ABDOMINAL;  Surgeon: Marylynn Pearson, MD;  Location: [facility];  Service: Gynecology;  Laterality: N/A;  with lysis of adhesions  . BASAL CELL CARCINOMA EXCISION Left    cheek/face  . CESAREAN SECTION  2002   HELLP Syndrome/ one time  . DILATION AND CURETTAGE OF UTERUS  2007   Miscarriage  . LAPAROSCOPIC TUBAL LIGATION  01/03/2011   Procedure: LAPAROSCOPIC TUBAL LIGATION;  Surgeon: Marylynn Pearson;  Location: [facility];  Service: Gynecology;  Laterality: Bilateral;  with filshie clips  . LAPAROSCOPY  03/04/2012   Procedure: LAPAROSCOPY DIAGNOSTIC;  Surgeon: Marylynn Pearson, MD;  Location: [facility];  Service: Gynecology;  Laterality: N/A;  with lysis of adhesions  . SHOULDER ARTHROSCOPY  2012   right  . SVT ABLATION N/A 12/17/2018   Procedure: SVT ABLATION;  Surgeon: Evans Lance, MD;  Location: [facility];  Service: Cardiovascular;  Laterality: N/A;  . TONSILLECTOMY      Past Medical Hx:  Past Medical History:  Diagnosis Date  . Arthritis   . BCC (basal cell carcinoma), face    skin   . Concussion with no loss of consciousness 12/28/2014  . Depression with anxiety 10/06/2012  . Diabetes mellitus without complication (HCC)    no meds  . GERD (gastroesophageal reflux disease) 09/05/2015  . Headache(784.0)    otc meds prn, migraine  . Hyperlipidemia, mixed 12/28/2014  . Hypertension    only with preg. 60yr ago 2002- no meds  . Hypothyroid 06/15/2012  . Obesity 09/05/2015  . Post-operative nausea and vomiting   . [place] tachycardia) [place]   . Tinea pedis 12/28/2014    Past Gynecological History:   GYNECOLOGIC HISTORY:  Patient's last menstrual period was 12/15/2010. Menarche: 53years old P 1 LMP 2013 at time of hysterectomy Contraceptive oral contraceptives approximately 10 years HRT none  Last Pap states January 2019 however hysterectomy was performed for non-dysplastic reasons.  Family Hx:   Family History  Problem Relation Age of Onset  . Hypertension Mother   . Depression Mother   . Breast cancer Mother 564 . Lung cancer Mother        lung (06-13-11)  . Basal cell carcinoma Brother 40  . Other Maternal Grandmother        spinal stenosis  . Emphysema Maternal Grandfather   . Prostate cancer Maternal Grandfather   . Breast cancer Cousin 463 . Colon cancer Neg Hx   . Esophageal cancer Neg Hx     Review of Systems:  Review of Systems  Constitutional: Negative.   HENT:  Negative.   Eyes: Negative.  Respiratory: Negative.   Cardiovascular: Negative.   Gastrointestinal: Negative.   Endocrine: Negative.   Genitourinary: Negative.    Musculoskeletal: Negative.   Skin: Negative.   Neurological: Negative.   Hematological: Negative.   Psychiatric/Behavioral: Negative.      Vitals:  Blood pressure 131/89, pulse 84, temperature 98.7 F (37.1 C), temperature source Oral, resp. rate 18, height '5\' 5"'  (1.651 m), weight 228 lb 9.6 oz (103.7 kg), last menstrual period 12/15/2010, SpO2 97 %. Body mass index is 38.04 kg/m.   Physical Exam: ECOG PERFORMANCE STATUS: 0 - Asymptomatic   General :  Well developed, 53 y.o., female in no apparent distress HEENT:  Normocephalic/atraumatic, symmetric, EOMI, eyelids normal Neck:   Supple, no masses.  Lymphatics:  No cervical/ submandibular/ supraclavicular/ infraclavicular/ inguinal adenopathy Respiratory:  Respirations unlabored, no use of accessory muscles CV:   Deferred Breast:  Deferred Musculoskeletal: No CVA tenderness, normal muscle strength. Abdomen:  Overweight, soft, non-tender and nondistended. No evidence of hernia. No masses. Extremities:  No lymphedema, no erythema, non-tender. Skin:   Normal inspection Neuro/Psych:  No focal motor deficit, no abnormal mental status. Normal gait. Normal affect. Alert and oriented to person, place, and time  Genito Urinary: Vulva: Normal external female genitalia.   Bladder/urethra: Urethral meatus normal in size and location. No lesions or   masses, well supported bladder Speculum exam: Vagina: No lesion, no discharge, no bleeding. Cervix: Surgically absent lesions. Bimanual exam:  Uterus: Surgically absent  Adnexa: No masses. Rectovaginal:  Deferred  Oncologic Summary: 1. Genetic predisposition to breast and ovarian cancer   Assessment/Plan: Genetic predisposition to ovarian cancer with BRIP1 gene mutation.  The recommendation is for risk reducing surgery with robotic assisted BSO.  I explained the alternative would be close surveillance with ultrasounds and Ca1 25 assessments.  She is desiring to proceed with definitive surgery.  Her surgery will likely be complicated somewhat due to her history of adhesive disease.  I explained the increased risk of damage to adjacent visceral structures.  It is still most likely will be able achieve this to a minimally invasive incision with outpatient surgery.  I discussed anticipated surgical stay and recovery.  I explained anticipated postoperative expectations.  [name], MD  02/04/2019, 12:38 PM  Cc: Marylynn Pearson, MD (Referring OB/GYN) Vivien Rossetti, MD (PCP)

## 2019-02-11 ENCOUNTER — Other Ambulatory Visit: Payer: Self-pay | Admitting: Family Medicine

## 2019-02-11 ENCOUNTER — Encounter: Payer: Self-pay | Admitting: Gynecologic Oncology

## 2019-02-12 NOTE — Patient Instructions (Addendum)
DUE TO COVID-19 ONLY ONE VISITOR IS ALLOWED TO COME WITH YOU AND STAY IN THE WAITING ROOM ONLY DURING PRE OP AND PROCEDURE DAY OF SURGERY. THE 1 VISITOR MAY VISIT WITH YOU AFTER SURGERY IN YOUR PRIVATE ROOM DURING VISITING HOURS ONLY!   YOU NEED TO HAVE A COVID 19 TEST ON____9-11-2020___ @___2 :05PM____, THIS TEST MUST BE DONE BEFORE SURGERY, COME  [address] [place] , [id number].  ([place]) ONCE YOUR COVID TEST IS COMPLETED, PLEASE BEGIN THE QUARANTINE INSTRUCTIONS AS OUTLINED IN YOUR HANDOUT.                Ebony [name]   Your procedure is scheduled on: 02-17-2019   Report to [facility] Main  Entrance   Report to admitting at 12:00PM  Call this number if you have problems the morning of surgery [phone]      Eat a light diet the day before surgery.  Examples including soups, broths, toast, yogurt, mashed potatoes.  Things to avoid include carbonated beverages (fizzy beverages), raw fruits and raw vegetables, or beans. If your bowels are filled with gas, your surgeon will have difficulty visualizing your pelvic organs which increases your surgical risks.  Do not eat food After Midnight. YOU MAY HAVE CLEAR LIQUIDS FROM MIDNIGHT UNTIL 11:00AM. Nothing by mouth after 11:00AM   CLEAR LIQUID DIET   Foods Allowed                                                                     Foods Excluded  Coffee and tea, regular and decaf                             liquids that you cannot  Plain Jell-O any favor except red or purple                                           see through such as: Fruit ices (not with fruit pulp)                                     milk, soups, orange juice  Iced Popsicles                                    All solid food Carbonated beverages, regular and diet                                    Cranberry, grape and apple juices Sports drinks like Gatorade Lightly seasoned clear broth or consume(fat free) Sugar, honey  syrup  Sample Menu Breakfast                                Lunch  Supper Cranberry juice                    Beef broth                            Chicken broth Jell-O                                     Grape juice                           Apple juice Coffee or tea                        Jell-O                                      Popsicle                                                Coffee or tea                        Coffee or tea  _____________________________________________________________________       BRUSH YOUR TEETH MORNING OF SURGERY AND RINSE YOUR MOUTH OUT, NO CHEWING GUM CANDY OR MINTS.     Take these medicines the morning of surgery with A SIP OF WATER: VENLAFAXINE, LEVOTHYROXINE   How to Manage Your Diabetes Before and After Surgery  Why is it important to control my blood sugar before and after surgery? . Improving blood sugar levels before and after surgery helps healing and can limit problems. . A way of improving blood sugar control is eating a healthy diet by: o  Eating less sugar and carbohydrates o  Increasing activity/exercise o  Talking with your doctor about reaching your blood sugar goals . High blood sugars (greater than 180 mg/dL) can raise your risk of infections and slow your recovery, so you will need to focus on controlling your diabetes during the weeks before surgery. . Make sure that the doctor who takes care of your diabetes knows about your planned surgery including the date and location.  How do I manage my blood sugar before surgery? . Check your blood sugar at least 4 times a day, starting 2 days before surgery, to make sure that the level is not too high or low. o Check your blood sugar the morning of your surgery when you wake up and every 2 hours until you get to the Short Stay unit. . If your blood sugar is less than 70 mg/dL, you will need to treat for low blood sugar: o Do not take  insulin. o Treat a low blood sugar (less than 70 mg/dL) with  cup of clear juice (cranberry or apple), 4 glucose tablets, OR glucose gel. o Recheck blood sugar in 15 minutes after treatment (to make sure it is greater than 70 mg/dL). If your blood sugar is not greater than 70 mg/dL on recheck, call [phone] for further instructions. . Report your blood sugar to the short stay nurse when you get to Short  Stay.  . If you are admitted to the hospital after surgery: o Your blood sugar will be checked by the staff and you will probably be given insulin after surgery (instead of oral diabetes medicines) to make sure you have good blood sugar levels. o The goal for blood sugar control after surgery is 80-180 mg/dL.    Patient Signature:  Date:   Nurse Signature:  Date:     Reviewed and Endorsed by [facility] Patient Education Committee, August 2015                               You may not have any metal on your body including hair pins and              piercings  Do not wear jewelry, make-up, lotions, powders or perfumes, deodorant             Do not wear nail polish.  Do not shave  48 hours prior to surgery.                 Do not bring valuables to the hospital. [place].  Contacts, dentures or bridgework may not be worn into surgery.  IF STAYING OVERNIGHT , YOU MAY BRING A SMALL BAG.      Patients discharged the day of surgery will not be allowed to drive home. IF YOU ARE HAVING SURGERY AND GOING HOME THE SAME DAY, YOU MUST HAVE AN ADULT TO DRIVE YOU HOME AND BE WITH YOU FOR 24 HOURS. YOU MAY GO HOME BY TAXI OR UBER OR ORTHERWISE, BUT AN ADULT MUST ACCOMPANY YOU HOME AND STAY WITH YOU FOR 24 HOURS.  Name and phone number of your driver:  Special Instructions: N/A              Please read over the following fact sheets you were given: _____________________________________________________________________             [facility] - Preparing for Surgery Before surgery, you can play an important role.  Because skin is not sterile, your skin needs to be as free of germs as possible.  You can reduce the number of germs on your skin by washing with CHG (chlorahexidine gluconate) soap before surgery.  CHG is an antiseptic cleaner which kills germs and bonds with the skin to continue killing germs even after washing. Please DO NOT use if you have an allergy to CHG or antibacterial soaps.  If your skin becomes reddened/irritated stop using the CHG and inform your nurse when you arrive at Short Stay. Do not shave (including legs and underarms) for at least 48 hours prior to the first CHG shower.  You may shave your face/neck. Please follow these instructions carefully:  1.  Shower with CHG Soap the night before surgery and the  morning of Surgery.  2.  If you choose to wash your hair, wash your hair first as usual with your  normal  shampoo.  3.  After you shampoo, rinse your hair and body thoroughly to remove the  shampoo.                           4.  Use CHG as you would any other liquid soap.  You can apply chg directly  to  the skin and wash                       Gently with a scrungie or clean washcloth.  5.  Apply the CHG Soap to your body ONLY FROM THE NECK DOWN.   Do not use on face/ open                           Wound or open sores. Avoid contact with eyes, ears mouth and genitals (private parts).                       Wash face,  Genitals (private parts) with your normal soap.             6.  Wash thoroughly, paying special attention to the area where your surgery  will be performed.  7.  Thoroughly rinse your body with warm water from the neck down.  8.  DO NOT shower/wash with your normal soap after using and rinsing off  the CHG Soap.                9.  Pat yourself dry with a clean towel.            10.  Wear clean pajamas.            11.  Place clean sheets on your bed the night of your first shower and do not   sleep with pets. Day of Surgery : Do not apply any lotions/deodorants the morning of surgery.  Please wear clean clothes to the hospital/surgery center.  FAILURE TO FOLLOW THESE INSTRUCTIONS MAY RESULT IN THE CANCELLATION OF YOUR SURGERY PATIENT SIGNATURE_________________________________  NURSE SIGNATURE__________________________________  ________________________________________________________________________   Ebony [name]  An incentive spirometer is a tool that can help keep your lungs clear and active. This tool measures how well you are filling your lungs with each breath. Taking long deep breaths may help reverse or decrease the chance of developing breathing (pulmonary) problems (especially infection) following:  A long period of time when you are unable to move or be active. BEFORE THE PROCEDURE   If the spirometer includes an indicator to show your best effort, your nurse or respiratory therapist will set it to a desired goal.  If possible, sit up straight or lean slightly forward. Try not to slouch.  Hold the incentive spirometer in an upright position. INSTRUCTIONS FOR USE  1. Sit on the edge of your bed if possible, or sit up as far as you can in bed or on a chair. 2. Hold the incentive spirometer in an upright position. 3. Breathe out normally. 4. Place the mouthpiece in your mouth and seal your lips tightly around it. 5. Breathe in slowly and as deeply as possible, raising the piston or the ball toward the top of the column. 6. Hold your breath for 3-5 seconds or for as long as possible. Allow the piston or ball to fall to the bottom of the column. 7. Remove the mouthpiece from your mouth and breathe out normally. 8. Rest for a few seconds and repeat Steps 1 through 7 at least 10 times every 1-2 hours when you are awake. Take your time and take a few normal breaths between deep breaths. 9. The spirometer may include an indicator to show your best effort. Use  the indicator as a goal to work toward during each repetition. 10. After each  set of 10 deep breaths, practice coughing to be sure your lungs are clear. If you have an incision (the cut made at the time of surgery), support your incision when coughing by placing a pillow or rolled up towels firmly against it. Once you are able to get out of bed, walk around indoors and cough well. You may stop using the incentive spirometer when instructed by your caregiver.  RISKS AND COMPLICATIONS  Take your time so you do not get dizzy or light-headed.  If you are in pain, you may need to take or ask for pain medication before doing incentive spirometry. It is harder to take a deep breath if you are having pain. AFTER USE  Rest and breathe slowly and easily.  It can be helpful to keep track of a log of your progress. Your caregiver can provide you with a simple table to help with this. If you are using the spirometer at home, follow these instructions: [place] IF:   You are having difficultly using the spirometer.  You have trouble using the spirometer as often as instructed.  Your pain medication is not giving enough relief while using the spirometer.  You develop fever of 100.5 F (38.1 C) or higher. SEEK IMMEDIATE MEDICAL CARE IF:   You cough up bloody sputum that had not been present before.  You develop fever of 102 F (38.9 C) or greater.  You develop worsening pain at or near the incision site. MAKE SURE YOU:   Understand these instructions.  Will watch your condition.  Will get help right away if you are not doing well or get worse. Document Released: 10/01/2006 Document Revised: 08/13/2011 Document Reviewed: 12/02/2006 ExitCare Patient Information 2014 [employer], [place].   ________________________________________________________________________  WHAT IS A BLOOD TRANSFUSION? Blood Transfusion Information  A transfusion is the replacement of blood or some of its parts.  Blood is made up of multiple cells which provide different functions.  Red blood cells carry oxygen and are used for blood loss replacement.  White blood cells fight against infection.  Platelets control bleeding.  Plasma helps clot blood.  Other blood products are available for specialized needs, such as hemophilia or other clotting disorders. BEFORE THE TRANSFUSION  Who gives blood for transfusions?   Healthy volunteers who are fully evaluated to make sure their blood is safe. This is blood bank blood. Transfusion therapy is the safest it has ever been in the practice of medicine. Before blood is taken from a donor, a complete history is taken to make sure that person has no history of diseases nor engages in risky social behavior (examples are intravenous drug use or sexual activity with multiple partners). The donor's travel history is screened to minimize risk of transmitting infections, such as malaria. The donated blood is tested for signs of infectious diseases, such as HIV and hepatitis. The blood is then tested to be sure it is compatible with you in order to minimize the chance of a transfusion reaction. If you or a relative donates blood, this is often done in anticipation of surgery and is not appropriate for emergency situations. It takes many days to process the donated blood. RISKS AND COMPLICATIONS Although transfusion therapy is very safe and saves many lives, the main dangers of transfusion include:   Getting an infectious disease.  Developing a transfusion reaction. This is an allergic reaction to something in the blood you were given. Every precaution is taken to prevent this. The decision to have a  blood transfusion has been considered carefully by your caregiver before blood is given. Blood is not given unless the benefits outweigh the risks. AFTER THE TRANSFUSION  Right after receiving a blood transfusion, you will usually feel much better and more energetic. This is  especially true if your red blood cells have gotten low (anemic). The transfusion raises the level of the red blood cells which carry oxygen, and this usually causes an energy increase.  The nurse administering the transfusion will monitor you carefully for complications. HOME CARE INSTRUCTIONS  No special instructions are needed after a transfusion. You may find your energy is better. Speak with your caregiver about any limitations on activity for underlying diseases you may have. SEEK MEDICAL CARE IF:   Your condition is not improving after your transfusion.  You develop redness or irritation at the intravenous (IV) site. SEEK IMMEDIATE MEDICAL CARE IF:  Any of the following symptoms occur over the next 12 hours:  Shaking chills.  You have a temperature by mouth above 102 F (38.9 C), not controlled by medicine.  Chest, back, or muscle pain.  People around you feel you are not acting correctly or are confused.  Shortness of breath or difficulty breathing.  Dizziness and fainting.  You get a rash or develop hives.  You have a decrease in urine output.  Your urine turns a dark color or changes to pink, red, or brown. Any of the following symptoms occur over the next 10 days:  You have a temperature by mouth above 102 F (38.9 C), not controlled by medicine.  Shortness of breath.  Weakness after normal activity.  The white part of the eye turns yellow (jaundice).  You have a decrease in the amount of urine or are urinating less often.  Your urine turns a dark color or changes to pink, red, or brown. Document Released: 05/18/2000 Document Revised: 08/13/2011 Document Reviewed: 01/05/2008 [facility] Patient Information 2014 [place], [place].  _______________________________________________________________________

## 2019-02-12 NOTE — Telephone Encounter (Signed)
I refilled pt's venlafaxine today and see that her next appt with isn't until 01/11/20 for a physical. Does she need to follow up with earlier for other health conditions?

## 2019-02-12 NOTE — Telephone Encounter (Signed)
No she is good, since she is stable I am letting her go out a year. But thanks for checking

## 2019-02-13 ENCOUNTER — Other Ambulatory Visit: Payer: Self-pay

## 2019-02-13 ENCOUNTER — Other Ambulatory Visit (HOSPITAL_COMMUNITY)
Admission: RE | Admit: 2019-02-13 | Discharge: 2019-02-13 | Disposition: A | Discharge status: Home / Self Care | Payer: 59 | Source: Ambulatory Visit | Attending: Gynecologic Oncology | Admitting: Gynecologic Oncology

## 2019-02-13 ENCOUNTER — Encounter (HOSPITAL_COMMUNITY)
Admission: RE | Admit: 2019-02-13 | Discharge: 2019-02-13 | Disposition: A | Discharge status: Home / Self Care | Payer: 59 | Source: Ambulatory Visit | Attending: Gynecologic Oncology | Admitting: Gynecologic Oncology

## 2019-02-13 ENCOUNTER — Encounter (HOSPITAL_COMMUNITY): Payer: Self-pay

## 2019-02-13 DIAGNOSIS — C562 Malignant neoplasm of left ovary: Secondary | ICD-10-CM | POA: Diagnosis not present

## 2019-02-13 DIAGNOSIS — Z01812 Encounter for preprocedural laboratory examination: Secondary | ICD-10-CM | POA: Insufficient documentation

## 2019-02-13 DIAGNOSIS — Z20828 Contact with and (suspected) exposure to other viral communicable diseases: Secondary | ICD-10-CM | POA: Insufficient documentation

## 2019-02-13 DIAGNOSIS — C561 Malignant neoplasm of right ovary: Secondary | ICD-10-CM | POA: Diagnosis not present

## 2019-02-13 LAB — COMPREHENSIVE METABOLIC PANEL
ALT: 50 U/L — ABNORMAL HIGH (ref 0–44)
AST: 42 U/L — ABNORMAL HIGH (ref 15–41)
Albumin: 4.6 g/dL (ref 3.5–5.0)
Alkaline Phosphatase: 75 U/L (ref 38–126)
Anion gap: 11 (ref 5–15)
BUN: 18 mg/dL (ref 6–20)
CO2: 25 mmol/L (ref 22–32)
Calcium: 9.8 mg/dL (ref 8.9–10.3)
Chloride: 104 mmol/L (ref 98–111)
Creatinine, Ser: 0.83 mg/dL (ref 0.44–1.00)
GFR calc Af Amer: >60 mL/min (ref >60)
GFR calc non Af Amer: >60 mL/min (ref >60)
Glucose, Bld: 95 mg/dL (ref 70–99)
Potassium: 3.9 mmol/L (ref 3.5–5.1)
Sodium: 140 mmol/L (ref 135–145)
Total Bilirubin: 0.6 mg/dL (ref 0.3–1.2)
Total Protein: 8 g/dL (ref 6.5–8.1)

## 2019-02-13 LAB — CBC
HCT: 42.4 % (ref 36.0–46.0)
Hemoglobin: 14.1 g/dL (ref 12.0–15.0)
MCH: 28.5 pg (ref 26.0–34.0)
MCHC: 33.3 g/dL (ref 30.0–36.0)
MCV: 85.8 fL (ref 80.0–100.0)
Platelets: 351 10*3/uL (ref 150–400)
RBC: 4.94 MIL/uL (ref 3.87–5.11)
RDW: 13.1 % (ref 11.5–15.5)
WBC: 8.6 10*3/uL (ref 4.0–10.5)
nRBC: 0 % (ref 0.0–0.2)

## 2019-02-13 LAB — URINALYSIS, ROUTINE W REFLEX MICROSCOPIC
Bilirubin Urine: NEGATIVE
Glucose, UA: NEGATIVE mg/dL
Hgb urine dipstick: NEGATIVE
Ketones, ur: NEGATIVE mg/dL
Leukocytes,Ua: NEGATIVE
Nitrite: NEGATIVE
Protein, ur: NEGATIVE mg/dL
Specific Gravity, Urine: 1.005 (ref 1.005–1.030)
pH: 6 (ref 5.0–8.0)

## 2019-02-13 LAB — GLUCOSE, CAPILLARY: Glucose-Capillary: 95 mg/dL (ref 70–99)

## 2019-02-13 NOTE — Progress Notes (Signed)
PCP - Mosie Lukes, MD Cardiologist - dr gregg taylor   Chest x-ray -  EKG - epic 2020 Stress Test -  ECHO - epic 2019 Cardiac Cath -   Sleep Study -  CPAP -   Fasting Blood Sugar - patient hx lists diabetes, patient denies and says she is prediabetic. Last hgba1c in epic 01-05-2019 was 6.3. cbg today 95.   Checks Blood Sugar ___0__ times a day  Blood Thinner Instructions: Aspirin Instructions: Last Dose:  Anesthesia review:   Underwent successful ablation for SVT on 12-17-2018. Patient asymptomatic today but reports blood pressure readings have increased since she was taken off Cardizem s/p ablation after taking it for years. She has discussed this with her cardiologist Dr Lovena Le. Patient just bought home bp machine. BP at pre-op today is 158/89. RN advised patient to reach out to dr taylor office  for elevated bp reading and commended patient on purchase of bp machine and encouraged checking 3 times daily, in relaxed, upright seated position.   Patient verbalized understanding of instructions that were given to them at the PAT appointment. Patient was also instructed that they will need to review over the PAT instructions again at home before surgery.

## 2019-02-14 LAB — ABO/RH: ABO/RH(D): B NEG

## 2019-02-15 LAB — NOVEL CORONAVIRUS, NAA (HOSP ORDER, SEND-OUT TO REF LAB; TAT 18-24 HRS): SARS-CoV-2, NAA: NOT DETECTED

## 2019-02-17 ENCOUNTER — Other Ambulatory Visit: Payer: Self-pay

## 2019-02-17 ENCOUNTER — Ambulatory Visit (HOSPITAL_COMMUNITY)
Admission: RE | Admit: 2019-02-17 | Discharge: 2019-02-17 | Disposition: A | Discharge status: Home / Self Care | Payer: 59 | Attending: Gynecologic Oncology | Admitting: Gynecologic Oncology

## 2019-02-17 ENCOUNTER — Ambulatory Visit (HOSPITAL_COMMUNITY): Payer: 59 | Admitting: Certified Registered Nurse Anesthetist

## 2019-02-17 ENCOUNTER — Encounter (HOSPITAL_COMMUNITY)
Admission: RE | Disposition: A | Discharge status: Home / Self Care | Payer: Self-pay | Source: Home / Self Care | Attending: Gynecologic Oncology

## 2019-02-17 ENCOUNTER — Encounter (HOSPITAL_COMMUNITY): Payer: Self-pay | Admitting: *Deleted

## 2019-02-17 ENCOUNTER — Telehealth (HOSPITAL_COMMUNITY): Payer: Self-pay | Admitting: *Deleted

## 2019-02-17 DIAGNOSIS — Z90710 Acquired absence of both cervix and uterus: Secondary | ICD-10-CM | POA: Insufficient documentation

## 2019-02-17 DIAGNOSIS — E669 Obesity, unspecified: Secondary | ICD-10-CM | POA: Diagnosis present

## 2019-02-17 DIAGNOSIS — Z79899 Other long term (current) drug therapy: Secondary | ICD-10-CM | POA: Insufficient documentation

## 2019-02-17 DIAGNOSIS — Z1502 Genetic susceptibility to malignant neoplasm of ovary: Secondary | ICD-10-CM | POA: Insufficient documentation

## 2019-02-17 DIAGNOSIS — Z79890 Hormone replacement therapy (postmenopausal): Secondary | ICD-10-CM | POA: Diagnosis not present

## 2019-02-17 DIAGNOSIS — M1990 Unspecified osteoarthritis, unspecified site: Secondary | ICD-10-CM | POA: Diagnosis not present

## 2019-02-17 DIAGNOSIS — E039 Hypothyroidism, unspecified: Secondary | ICD-10-CM | POA: Insufficient documentation

## 2019-02-17 DIAGNOSIS — K219 Gastro-esophageal reflux disease without esophagitis: Secondary | ICD-10-CM | POA: Insufficient documentation

## 2019-02-17 DIAGNOSIS — Z803 Family history of malignant neoplasm of breast: Secondary | ICD-10-CM | POA: Diagnosis not present

## 2019-02-17 DIAGNOSIS — Z1501 Genetic susceptibility to malignant neoplasm of breast: Secondary | ICD-10-CM

## 2019-02-17 DIAGNOSIS — Z6837 Body mass index (BMI) 37.0-37.9, adult: Secondary | ICD-10-CM | POA: Diagnosis not present

## 2019-02-17 DIAGNOSIS — E119 Type 2 diabetes mellitus without complications: Secondary | ICD-10-CM | POA: Insufficient documentation

## 2019-02-17 DIAGNOSIS — F418 Other specified anxiety disorders: Secondary | ICD-10-CM | POA: Diagnosis not present

## 2019-02-17 HISTORY — PX: LAPAROSCOPIC LYSIS OF ADHESIONS: SHX5905

## 2019-02-17 HISTORY — PX: ROBOTIC ASSISTED BILATERAL SALPINGO OOPHERECTOMY: SHX6078

## 2019-02-17 LAB — TYPE AND SCREEN
ABO/RH(D): B NEG
Antibody Screen: NEGATIVE

## 2019-02-17 SURGERY — SALPINGO-OOPHORECTOMY, BILATERAL, ROBOT-ASSISTED
Anesthesia: General

## 2019-02-17 MED ORDER — SODIUM CHLORIDE 0.9 % IV SOLN
INTRAVENOUS | Status: DC | PRN
  Administered 2019-02-17: 40 ug/min via INTRAVENOUS

## 2019-02-17 MED ORDER — ENOXAPARIN SODIUM 40 MG/0.4ML ~~LOC~~ SOLN
40.0000 mg | SUBCUTANEOUS | Status: AC
  Administered 2019-02-17: 40 mg via SUBCUTANEOUS
  Filled 2019-02-17: qty 0.4

## 2019-02-17 MED ORDER — KETOROLAC TROMETHAMINE 15 MG/ML IJ SOLN
15.0000 mg | Freq: Four times a day (QID) | INTRAMUSCULAR | Status: DC
  Administered 2019-02-17: 17:00:00 15 mg via INTRAVENOUS

## 2019-02-17 MED ORDER — PROPOFOL 10 MG/ML IV BOLUS
INTRAVENOUS | Status: AC
  Filled 2019-02-17: qty 20

## 2019-02-17 MED ORDER — ACETAMINOPHEN 500 MG PO TABS
1000.0000 mg | ORAL_TABLET | ORAL | Status: AC
  Administered 2019-02-17: 12:00:00 1000 mg via ORAL
  Filled 2019-02-17: qty 2

## 2019-02-17 MED ORDER — DEXAMETHASONE SODIUM PHOSPHATE 4 MG/ML IJ SOLN: 4.0000 mg | INTRAMUSCULAR | Status: DC

## 2019-02-17 MED ORDER — BUPIVACAINE HCL (PF) 0.25 % IJ SOLN
INTRAMUSCULAR | Status: DC | PRN
  Administered 2019-02-17: 20 mL

## 2019-02-17 MED ORDER — STERILE WATER FOR IRRIGATION IR SOLN
Status: DC | PRN
  Administered 2019-02-17: 1000 mL

## 2019-02-17 MED ORDER — LACTATED RINGERS IR SOLN
Status: DC | PRN
  Administered 2019-02-17: 1000 mL

## 2019-02-17 MED ORDER — PROMETHAZINE HCL 25 MG/ML IJ SOLN
INTRAMUSCULAR | Status: AC
  Administered 2019-02-17: 6.25 mg via INTRAVENOUS
  Filled 2019-02-17: qty 1

## 2019-02-17 MED ORDER — HYDROMORPHONE HCL 1 MG/ML IJ SOLN: 0.2500 mg | INTRAMUSCULAR | Status: DC | PRN

## 2019-02-17 MED ORDER — LIDOCAINE 2% (20 MG/ML) 5 ML SYRINGE
INTRAMUSCULAR | Status: DC | PRN
  Administered 2019-02-17: 1.5 mg/kg/h via INTRAVENOUS

## 2019-02-17 MED ORDER — KETOROLAC TROMETHAMINE 15 MG/ML IJ SOLN
INTRAMUSCULAR | Status: AC
  Administered 2019-02-17: 15 mg via INTRAVENOUS
  Filled 2019-02-17: qty 1

## 2019-02-17 MED ORDER — LIDOCAINE 2% (20 MG/ML) 5 ML SYRINGE
INTRAMUSCULAR | Status: DC | PRN
  Administered 2019-02-17: 60 mg via INTRAVENOUS

## 2019-02-17 MED ORDER — SUGAMMADEX SODIUM 200 MG/2ML IV SOLN
INTRAVENOUS | Status: DC | PRN
  Administered 2019-02-17: 200 mg via INTRAVENOUS

## 2019-02-17 MED ORDER — FENTANYL CITRATE (PF) 250 MCG/5ML IJ SOLN
INTRAMUSCULAR | Status: AC
  Filled 2019-02-17: qty 5

## 2019-02-17 MED ORDER — PROMETHAZINE HCL 25 MG/ML IJ SOLN
6.2500 mg | Freq: Once | INTRAMUSCULAR | Status: AC | PRN
  Administered 2019-02-17: 16:00:00 6.25 mg via INTRAVENOUS

## 2019-02-17 MED ORDER — GABAPENTIN 300 MG PO CAPS
300.0000 mg | ORAL_CAPSULE | ORAL | Status: AC
  Administered 2019-02-17: 300 mg via ORAL
  Filled 2019-02-17: qty 1

## 2019-02-17 MED ORDER — BUPIVACAINE HCL (PF) 0.25 % IJ SOLN
INTRAMUSCULAR | Status: AC
  Filled 2019-02-17: qty 30

## 2019-02-17 MED ORDER — SUCCINYLCHOLINE CHLORIDE 200 MG/10ML IV SOSY
PREFILLED_SYRINGE | INTRAVENOUS | Status: DC | PRN
  Administered 2019-02-17: 100 mg via INTRAVENOUS

## 2019-02-17 MED ORDER — MIDAZOLAM HCL 2 MG/2ML IJ SOLN
INTRAMUSCULAR | Status: AC
  Filled 2019-02-17: qty 2

## 2019-02-17 MED ORDER — FENTANYL CITRATE (PF) 100 MCG/2ML IJ SOLN
INTRAMUSCULAR | Status: DC | PRN
  Administered 2019-02-17 (×3): 50 ug via INTRAVENOUS

## 2019-02-17 MED ORDER — DEXAMETHASONE SODIUM PHOSPHATE 10 MG/ML IJ SOLN
INTRAMUSCULAR | Status: DC | PRN
  Administered 2019-02-17: 10 mg via INTRAVENOUS

## 2019-02-17 MED ORDER — SCOPOLAMINE 1 MG/3DAYS TD PT72
1.0000 | MEDICATED_PATCH | TRANSDERMAL | Status: DC
  Administered 2019-02-17: 12:00:00 1.5 mg via TRANSDERMAL
  Filled 2019-02-17: qty 1

## 2019-02-17 MED ORDER — MIDAZOLAM HCL 5 MG/5ML IJ SOLN
INTRAMUSCULAR | Status: DC | PRN
  Administered 2019-02-17: 2 mg via INTRAVENOUS

## 2019-02-17 MED ORDER — CELECOXIB 200 MG PO CAPS
400.0000 mg | ORAL_CAPSULE | ORAL | Status: AC
  Administered 2019-02-17: 12:00:00 400 mg via ORAL
  Filled 2019-02-17: qty 2

## 2019-02-17 MED ORDER — LACTATED RINGERS IV SOLN
INTRAVENOUS | Status: DC
  Administered 2019-02-17 (×2): via INTRAVENOUS

## 2019-02-17 MED ORDER — PHENYLEPHRINE HCL (PRESSORS) 10 MG/ML IV SOLN
INTRAVENOUS | Status: DC | PRN
  Administered 2019-02-17: 160 ug via INTRAVENOUS
  Administered 2019-02-17 (×2): 120 ug via INTRAVENOUS

## 2019-02-17 MED ORDER — CEFAZOLIN SODIUM-DEXTROSE 2-4 GM/100ML-% IV SOLN
2.0000 g | INTRAVENOUS | Status: AC
  Administered 2019-02-17: 14:00:00 2 g via INTRAVENOUS
  Filled 2019-02-17: qty 100

## 2019-02-17 MED ORDER — KETAMINE HCL 10 MG/ML IJ SOLN
INTRAMUSCULAR | Status: AC
  Filled 2019-02-17: qty 1

## 2019-02-17 MED ORDER — PROPOFOL 10 MG/ML IV BOLUS
INTRAVENOUS | Status: DC | PRN
  Administered 2019-02-17: 200 mg via INTRAVENOUS

## 2019-02-17 MED ORDER — ROCURONIUM BROMIDE 50 MG/5ML IV SOSY
PREFILLED_SYRINGE | INTRAVENOUS | Status: DC | PRN
  Administered 2019-02-17: 50 mg via INTRAVENOUS

## 2019-02-17 MED ORDER — EPHEDRINE SULFATE 50 MG/ML IJ SOLN
INTRAMUSCULAR | Status: DC | PRN
  Administered 2019-02-17 (×2): 10 mg via INTRAVENOUS

## 2019-02-17 MED ORDER — KETAMINE HCL 10 MG/ML IJ SOLN
INTRAMUSCULAR | Status: DC | PRN
  Administered 2019-02-17: 30 mg via INTRAVENOUS

## 2019-02-17 MED ORDER — ONDANSETRON HCL 4 MG/2ML IJ SOLN
INTRAMUSCULAR | Status: DC | PRN
  Administered 2019-02-17: 4 mg via INTRAVENOUS

## 2019-02-17 SURGICAL SUPPLY — 65 items
ADH SKN CLS APL DERMABOND .7 (GAUZE/BANDAGES/DRESSINGS) ×2
AGENT HMST KT MTR STRL THRMB (HEMOSTASIS)
APL ESCP 34 STRL LF DISP (HEMOSTASIS)
APPLICATOR SURGIFLO ENDO (HEMOSTASIS) IMPLANT
BAG LAPAROSCOPIC 12 15 PORT 16 (BASKET) IMPLANT
BAG RETRIEVAL 12/15 (BASKET)
BAG SPEC RTRVL LRG 6X4 10 (ENDOMECHANICALS) ×4
BLADE SURG SZ10 CARB STEEL (BLADE) IMPLANT
COVER BACK TABLE 60X90IN (DRAPES) ×3 IMPLANT
COVER TIP SHEARS 8 DVNC (MISCELLANEOUS) ×2 IMPLANT
COVER TIP SHEARS 8MM DA VINCI (MISCELLANEOUS) ×1
COVER WAND RF STERILE (DRAPES) IMPLANT
DECANTER SPIKE VIAL GLASS SM (MISCELLANEOUS) ×1 IMPLANT
DERMABOND ADVANCED (GAUZE/BANDAGES/DRESSINGS) ×1
DERMABOND ADVANCED .7 DNX12 (GAUZE/BANDAGES/DRESSINGS) ×2 IMPLANT
DRAPE ARM DVNC X/XI (DISPOSABLE) ×8 IMPLANT
DRAPE COLUMN DVNC XI (DISPOSABLE) ×2 IMPLANT
DRAPE DA VINCI XI ARM (DISPOSABLE) ×4
DRAPE DA VINCI XI COLUMN (DISPOSABLE) ×1
DRAPE SHEET LG 3/4 BI-LAMINATE (DRAPES) ×3 IMPLANT
DRAPE SURG IRRIG POUCH 19X23 (DRAPES) ×3 IMPLANT
DRSG OPSITE POSTOP 4X6 (GAUZE/BANDAGES/DRESSINGS) IMPLANT
DRSG OPSITE POSTOP 4X8 (GAUZE/BANDAGES/DRESSINGS) IMPLANT
ELECT REM PT RETURN 15FT ADLT (MISCELLANEOUS) ×3 IMPLANT
GLOVE BIO SURGEON STRL SZ 6 (GLOVE) ×12 IMPLANT
GLOVE BIO SURGEON STRL SZ 6.5 (GLOVE) IMPLANT
GOWN STRL REUS W/ TWL LRG LVL3 (GOWN DISPOSABLE) ×8 IMPLANT
GOWN STRL REUS W/TWL LRG LVL3 (GOWN DISPOSABLE) ×12
HOLDER FOLEY CATH W/STRAP (MISCELLANEOUS) ×3 IMPLANT
IRRIG SUCT STRYKERFLOW 2 WTIP (MISCELLANEOUS) ×3
IRRIGATION SUCT STRKRFLW 2 WTP (MISCELLANEOUS) ×2 IMPLANT
KIT PROCEDURE DA VINCI SI (MISCELLANEOUS)
KIT PROCEDURE DVNC SI (MISCELLANEOUS) IMPLANT
KIT TURNOVER KIT A (KITS) IMPLANT
MANIPULATOR UTERINE 4.5 ZUMI (MISCELLANEOUS) ×3 IMPLANT
NDL SPNL 18GX3.5 QUINCKE PK (NEEDLE) IMPLANT
NEEDLE HYPO 22GX1.5 SAFETY (NEEDLE) IMPLANT
NEEDLE SPNL 18GX3.5 QUINCKE PK (NEEDLE) IMPLANT
OBTURATOR OPTICAL STANDARD 8MM (TROCAR) ×1
OBTURATOR OPTICAL STND 8 DVNC (TROCAR) ×2
OBTURATOR OPTICALSTD 8 DVNC (TROCAR) ×2 IMPLANT
PACK ROBOT GYN CUSTOM WL (TRAY / TRAY PROCEDURE) ×3 IMPLANT
PAD POSITIONING PINK XL (MISCELLANEOUS) ×3 IMPLANT
PORT ACCESS TROCAR AIRSEAL 12 (TROCAR) ×2 IMPLANT
PORT ACCESS TROCAR AIRSEAL 5M (TROCAR) ×1
POUCH SPECIMEN RETRIEVAL 10MM (ENDOMECHANICALS) ×2 IMPLANT
SEAL CANN UNIV 5-8 DVNC XI (MISCELLANEOUS) ×6 IMPLANT
SEAL XI 5MM-8MM UNIVERSAL (MISCELLANEOUS) ×3
SET TRI-LUMEN FLTR TB AIRSEAL (TUBING) ×3 IMPLANT
SPONGE LAP 18X18 X RAY DECT (DISPOSABLE) IMPLANT
SURGIFLO W/THROMBIN 8M KIT (HEMOSTASIS) IMPLANT
SUT MNCRL AB 4-0 PS2 18 (SUTURE) IMPLANT
SUT PDS AB 1 TP1 96 (SUTURE) IMPLANT
SUT VIC AB 0 CT1 27 (SUTURE)
SUT VIC AB 0 CT1 27XBRD ANTBC (SUTURE) IMPLANT
SUT VIC AB 2-0 CT1 27 (SUTURE)
SUT VIC AB 2-0 CT1 TAPERPNT 27 (SUTURE) IMPLANT
SUT VIC AB 4-0 PS2 18 (SUTURE) ×6 IMPLANT
SYR 10ML LL (SYRINGE) IMPLANT
TOWEL OR NON WOVEN STRL DISP B (DISPOSABLE) ×3 IMPLANT
TRAP SPECIMEN MUCOUS 40CC (MISCELLANEOUS) ×1 IMPLANT
TRAY FOLEY MTR SLVR 16FR STAT (SET/KITS/TRAYS/PACK) ×3 IMPLANT
UNDERPAD 30X36 HEAVY ABSORB (UNDERPADS AND DIAPERS) ×3 IMPLANT
WATER STERILE IRR 1000ML POUR (IV SOLUTION) ×3 IMPLANT
YANKAUER SUCT BULB TIP 10FT TU (MISCELLANEOUS) IMPLANT

## 2019-02-17 NOTE — Anesthesia Preprocedure Evaluation (Addendum)
Anesthesia Evaluation  Patient identified by MRN, date of birth, ID band Patient awake    Reviewed: Allergy & Precautions, H&P , NPO status , Patient's Chart, lab work & pertinent test results  History of Anesthesia Complications (+) PONV  Airway Mallampati: II  TM Distance: >3 FB Neck ROM: Full    Dental no notable dental hx. (+) Teeth Intact, Dental Advisory Given   Pulmonary neg pulmonary ROS,    Pulmonary exam normal breath sounds clear to auscultation       Cardiovascular hypertension,  Rhythm:Regular Rate:Normal     Neuro/Psych  Headaches, Anxiety Depression    GI/Hepatic Neg liver ROS, GERD  Medicated and Controlled,  Endo/Other  diabetes, Well ControlledHypothyroidism Morbid obesity  Renal/GU negative Renal ROS  negative genitourinary   Musculoskeletal  (+) Arthritis , Osteoarthritis,    Abdominal   Peds  Hematology negative hematology ROS (+)   Anesthesia Other Findings   Reproductive/Obstetrics negative OB ROS                            Anesthesia Physical Anesthesia Plan  ASA: III  Anesthesia Plan: General   Post-op Pain Management:    Induction: Intravenous  PONV Risk Score and Plan: 4 or greater and Ondansetron, Dexamethasone, Midazolam and Scopolamine patch - Pre-op  Airway Management Planned: Oral ETT  Additional Equipment:   Intra-op Plan:   Post-operative Plan: Extubation in OR  Informed Consent: I have reviewed the patients History and Physical, chart, labs and discussed the procedure including the risks, benefits and alternatives for the proposed anesthesia with the patient or authorized representative who has indicated his/her understanding and acceptance.     Dental advisory given  Plan Discussed with: CRNA  Anesthesia Plan Comments:        Anesthesia Quick Evaluation

## 2019-02-17 NOTE — Transfer of Care (Signed)
Immediate Anesthesia Transfer of Care Note  Patient: Ebony [name]  Procedure(s) Performed: XI ROBOTIC ASSISTED BILATERAL SALPINGO OOPHORECTOMY (Bilateral ) LAPAROSCOPIC LYSIS OF ADHESIONS (N/A )  Patient Location: PACU  Anesthesia Type:General  Level of Consciousness: awake, alert  and patient cooperative  Airway & Oxygen Therapy: Patient Spontanous Breathing and Patient connected to face mask oxygen  Post-op Assessment: Report given to RN, Post -op Vital signs reviewed and stable and Patient moving all extremities X 4  Post vital signs: Reviewed and stable  Last Vitals:  Vitals Value Taken Time  BP 154/92 02/17/19 1510  Temp    Pulse 81 02/17/19 1515  Resp 16 02/17/19 1515  SpO2 99 % 02/17/19 1515  Vitals shown include unvalidated device data.  Last Pain:  Vitals:   02/17/19 1130  TempSrc:   PainSc: 0-No pain         Complications: No apparent anesthesia complications

## 2019-02-17 NOTE — Op Note (Signed)
OPERATIVE NOTE  Date: 02/17/19  Preoperative Diagnosis: hereditary increased risk (genetic) for ovarian cancer (BRIP gene mutation).   Postoperative Diagnosis:  same  Procedure(s) Performed: Robotic-assisted laparoscopic bilateral salpingo-oophorectomy  Surgeon: Everitt Amber, M.D.  Assistant Surgeon: Lahoma Crocker M.D. (an MD assistant was necessary for tissue manipulation, management of robotic instrumentation, retraction and positioning due to the complexity of the case and hospital policies).   Anesthesia: Gen. endotracheal.  Specimens: Bilateral ovaries, fallopian tubes, pelvic washings  Estimated Blood Loss: <10 mL. Blood Replacement: None  Complications: none  Indication for Procedure:  BRIP mutation (deleterious) increasing risk for ovarian cancer. Prior hysterectomy.   Operative Findings: adhesions between omentum and undersurface of low transverse incision. Small incisional hernia within low transverse incision (1cm) containing peritoneal fat).   Procedure: The patient's taken to the operating room and placed under general endotracheal anesthesia testing difficulty. She is placed in a dorsolithotomy position and cervical acromial pad was placed. The arms were tucked with care taken to pad the olecranon process. And prepped and draped in usual sterile fashion. A 33mm incision was made in the left upper quadrant palmer's point and a 5 mm Optiview trocar used to enter the abdomen under direct visualization. With entry into the abdomen and then maintenance of 15 mm of mercury the patient was placed in Trendelenburg position. An incision was made in the umbilicus to excise the prior scar and a  30mm trochar was placed through this site. Two incisions were made lateral to the umbilical incision in the left and right abdomen measuring 84mm. These incisions were made approximately 10 cm lateral to the umbilical incision. 8 mm robotic trochars were inserted. Sharp adhesiolysis separated the  omentum from the low transverse incision. Some fatty tissue was retrieved The robot was docked.  The abdomen was inspected as was the pelvis.  Pelvic washings were obtained. An incision was made on the right pelvic side wall peritoneum parallel to the IP ligament and the retroperitoneal space entered. The right ureter was identified and the para-rectal space was developed. A window was created in the right broad ligament above the ureter. The right infundibulopelvic vessels were skeletonized cauterized and transected. The attachments of the vagina to the ovary were similarly were cauterized and transected. Specimen was placed in an Endo Catch bag.  In a similar manner the left peritoneum and the side wall was incised, and the retroperitoneal space entered. The left ureter was identified and the left pararectal space was developed. The utero-ovarian ligament was skeletonized cauterized and transected. The left vaginal attachments were cauterized and transected in the left adnexa was placed in an Endo Catch bag.  The abdomen was copiously irrigated and drained and all operative sites inspected and hemostasis was assured  The robot was undocked. The camera was placed through the left upper abdominal incision. The contents of the left Endo Catch bag were first aspirated and then morcellated to facilitate removal from the abdominal cavity through the umbilical incision. In a similar fashion the contents of the right Endo Catch bag or morcellated to facilitate removal from the abdominal cavity.  The ports were all remove. The fascial closure at the umbilical incision and left upper quadrant port was made with 0 Vicryl.  All incisions were closed with a running subcuticular Monocryl suture. Dermabond was applied. Sponge, lap and needle counts were correct x 3.    The patient had sequential compression devices for VTE prophylaxis.         Disposition: PACU stable  Condition: stable  Donaciano Eva, MD

## 2019-02-17 NOTE — Anesthesia Postprocedure Evaluation (Signed)
Anesthesia Post Note  Patient: Ebony [name]  Procedure(s) Performed: XI ROBOTIC ASSISTED BILATERAL SALPINGO OOPHORECTOMY (Bilateral ) LAPAROSCOPIC LYSIS OF ADHESIONS (N/A )     Patient location during evaluation: PACU Anesthesia Type: General Level of consciousness: awake and alert Pain management: pain level controlled Vital Signs Assessment: post-procedure vital signs reviewed and stable Respiratory status: spontaneous breathing, nonlabored ventilation, respiratory function stable and patient connected to nasal cannula oxygen Cardiovascular status: blood pressure returned to baseline and stable Postop Assessment: no apparent nausea or vomiting Anesthetic complications: no    Last Vitals:  Vitals:   02/17/19 1600 02/17/19 1615  BP: (!) 136/92 (!) 147/92  Pulse: 67 64  Resp: 16 13  Temp:    SpO2: 93% 94%    Last Pain:  Vitals:   02/17/19 1130  TempSrc:   PainSc: 0-No pain                 Tiffnay Bossi,W. EDMOND

## 2019-02-17 NOTE — Interval H&P Note (Signed)
History and Physical Interval Note:  02/17/2019 1:01 PM  Ebony [name]  has presented today for surgery, with the diagnosis of GENETIC  PREDISPOSITION TO OVARIAN CANCER, BRIT 1 GENETIC MUTATION.  The various methods of treatment have been discussed with the patient and family. After consideration of risks, benefits and other options for treatment, the patient has consented to  Procedure(s): XI ROBOTIC ASSISTED BILATERAL SALPINGO OOPHORECTOMY (Bilateral) LAPAROSCOPIC LYSIS OF ADHESIONS (N/A) as a surgical intervention.  The patient's history has been reviewed, patient examined, no change in status, stable for surgery.  I have reviewed the patient's chart and labs.  Questions were answered to the patient's satisfaction.     [name]

## 2019-02-17 NOTE — Anesthesia Procedure Notes (Addendum)
Procedure Name: Intubation Date/Time: 02/17/2019 1:36 PM Performed by: Silas Sacramento, CRNA Pre-anesthesia Checklist: Patient identified, Emergency Drugs available, Suction available and Patient being monitored Patient Re-evaluated:Patient Re-evaluated prior to induction Oxygen Delivery Method: Circle system utilized Preoxygenation: Pre-oxygenation with 100% oxygen Induction Type: IV induction Ventilation: Mask ventilation without difficulty Laryngoscope Size: Mac and 3 Grade View: Grade I Tube type: Oral Tube size: 7.0 mm Number of attempts: 1 Airway Equipment and Method: Stylet and Oral airway Placement Confirmation: ETT inserted through vocal cords under direct vision,  positive ETCO2 and breath sounds checked- equal and bilateral Secured at: 21 cm Tube secured with: Tape Dental Injury: Teeth and Oropharynx as per pre-operative assessment

## 2019-02-17 NOTE — Discharge Instructions (Signed)
02/17/2019  Return to work: 2 - 4 weeks  Activity: 1. Be up and out of the bed during the day.  Take a nap if needed.  You may walk up steps but be careful and use the hand rail.  Stair climbing will tire you more than you think, you may need to stop part way and rest.   2. No lifting or straining for 6 weeks.  3. No driving for 1 weeks.  Do Not drive if you are taking narcotic pain medicine.  4. Shower daily.  Use soap and water on your incision and pat dry; don't rub.   5. No sexual activity and nothing in the vagina for 2 weeks.  Medications:  - Take ibuprofen and tylenol first line for pain control. Take these regularly (every 6 hours) to decrease the build up of pain.  - If necessary, for severe pain not relieved by ibuprofen, take percocet.  - While taking percocet you should take sennakot every night to reduce the likelihood of constipation. If this causes diarrhea, stop its use.  Diet: 1. Low sodium Heart Healthy Diet is recommended.  2. It is safe to use a laxative if you have difficulty moving your bowels.   Wound Care: 1. Keep clean and dry.  Shower daily.  Reasons to call the Doctor:   Fever - Oral temperature greater than 100.4 degrees Fahrenheit  Foul-smelling vaginal discharge  Difficulty urinating  Nausea and vomiting  Increased pain at the site of the incision that is unrelieved with pain medicine.  Difficulty breathing with or without chest pain  New calf pain especially if only on one side  Sudden, continuing increased vaginal bleeding with or without clots.   Follow-up: 1. See Everitt Amber in 3-4 weeks.  Contacts: For questions or concerns you should contact:  Dr. Everitt Amber at [phone] After hours and on week-ends call [phone] and ask to speak to the physician on call for Gynecologic Oncology

## 2019-02-18 ENCOUNTER — Encounter (HOSPITAL_COMMUNITY): Payer: Self-pay | Admitting: Gynecologic Oncology

## 2019-02-18 ENCOUNTER — Telehealth: Payer: Self-pay

## 2019-02-18 NOTE — Telephone Encounter (Signed)
Ms Cumba states that she is doing well. Very little abdominal pain. She is only using the ibuprofen only at this time. She is eating, drinking, and urinating well. Passing gas. She will begin the senokot-S today. Told her that she can increase it to two tabs bid if no BM by tomorrow. Pt aware of post op appointment on 03-09-19 and the office number if she has any questions or concerns.

## 2019-02-18 NOTE — Addendum Note (Signed)
Addendum  created 02/18/19 2220 by Silas Sacramento, CRNA   Intraprocedure Staff edited

## 2019-02-19 LAB — SURGICAL PATHOLOGY

## 2019-02-19 LAB — CYTOLOGY - NON PAP

## 2019-02-23 ENCOUNTER — Telehealth: Payer: Self-pay

## 2019-02-23 NOTE — Telephone Encounter (Signed)
Told Ebony [name] that the the pathology from surgery was benign per Ebony John, NP.

## 2019-02-24 ENCOUNTER — Encounter: Payer: Self-pay | Admitting: Gynecologic Oncology

## 2019-02-24 DIAGNOSIS — K219 Gastro-esophageal reflux disease without esophagitis: Secondary | ICD-10-CM

## 2019-02-25 MED ORDER — PANTOPRAZOLE SODIUM 40 MG PO TBEC: 40.0000 mg | DELAYED_RELEASE_TABLET | Freq: Two times a day (BID) | ORAL | 5 refills | Status: DC

## 2019-02-25 NOTE — Telephone Encounter (Signed)
Called and spoke with patient-patient informed of MD recommendations and is agreeable to plan of care; patient advised of RX being sent to pharmacy of patient choice; patient also advised to call back to the office should questions/concerns arise; patient verbalized understanding of information/instructions;

## 2019-02-27 ENCOUNTER — Encounter: Payer: Self-pay | Admitting: Gastroenterology

## 2019-02-27 ENCOUNTER — Ambulatory Visit (INDEPENDENT_AMBULATORY_CARE_PROVIDER_SITE_OTHER): Payer: 59 | Admitting: Gastroenterology

## 2019-02-27 ENCOUNTER — Other Ambulatory Visit: Payer: Self-pay

## 2019-02-27 VITALS — Ht 65.0 in | Wt 225.0 lb

## 2019-02-27 DIAGNOSIS — K2100 Gastro-esophageal reflux disease with esophagitis, without bleeding: Secondary | ICD-10-CM

## 2019-02-27 DIAGNOSIS — Z86010 Personal history of colonic polyps: Secondary | ICD-10-CM | POA: Diagnosis not present

## 2019-02-27 NOTE — Progress Notes (Signed)
P  Chief Complaint:    GERD  GI History: 53 year old female with GERD complicated by erosive esophagitis noted on EGD in 11/2018.  Index symptoms of HB and regurgitation. Was initially well controlled with Zantac, changed to Pepcid due to carcinogen profile.  EGD in 11/2018 with LA Grade A esophagitis, treated with Protonix 40 mg p.o. twice daily x4 weeks, with excellent clinical response, and resolution of index reflux symptoms.  GERD-HRQL Questionnaire score: 24 in 05/2018  Endoscopic history: -EGD (11/2018, Dr. Bryan Lemma): LA Grade A esophagitis, otherwise normal -Colonoscopy (11/2018, Dr. Bryan Lemma): 4 tubular adenoma polyps, largest 10 mm.  Tortuous colon.  Repeat in 3 years for ongoing surveillance.  HPI:    Due to current restrictions/limitations of in-office visits due to the COVID-19 pandemic, this scheduled clinical appointment was converted to a telehealth virtual consultation using [employer].  -Time of medical discussion: 21 minutes -The patient did consent to this virtual visit and is aware of possible charges through their insurance for this visit.  -Names of all parties present: Ebony [name] (patient), Gerrit Heck, DO, [facility] (physician) -Patient location: Home -Physician location: Office  Patient is a 53 y.o. female presenting to the Gastroenterology Clinic for follow-up.  Reflux well controlled since starting on the Protonix 40 mg BID.  Had briefly trialed coming off the Protonix, with immediate recurrence of index symptoms (HB, regurgitation, nocturnal sxs), so she restarted with again good control.  Otherwise, essentially offers no complaints today.  Review of systems:     No chest pain, no SOB, no fevers, no urinary sx   Past Medical History:  Diagnosis Date  . Arthritis    hips    . BCC (basal cell carcinoma), face    skin   . Concussion with no loss of consciousness 12/28/2014  . Depression with anxiety 10/06/2012  . Diabetes mellitus without  complication ([place])    no meds--- reports as borderline , last a1c 01-05-2019 with PCP 6.3%  . GERD (gastroesophageal reflux disease) 09/05/2015   MGD WITH PPI   . Headache(784.0)    otc meds prn, migraine, NOW 1-2 TIMES ANNUALLY   . Hyperlipidemia, mixed 12/28/2014  . Hypertension    only with preg. 55yrs ago 2002- no meds  . Hypothyroid 06/15/2012  . Obesity 09/05/2015  . Post-operative nausea and vomiting   . SVT (supraventricular tachycardia) ([place])   . Tinea pedis 12/28/2014    Patient's surgical history, family medical history, social history, medications and allergies were all reviewed in Epic    Current Outpatient Medications  Medication Sig Dispense Refill  . Ascorbic Acid (VITAMIN C) 1000 MG tablet Take 1,000 mg by mouth every evening.    Marland Kitchen BIOTIN PO Take 1 tablet by mouth daily.    . Cholecalciferol (VITAMIN D) 50 MCG (2000 UT) tablet Take 2,000 Units by mouth daily.    Marland Kitchen ibuprofen (ADVIL) 800 MG tablet Take 1 tablet (800 mg total) by mouth every 8 (eight) hours as needed for moderate pain. For AFTER surgery 30 tablet 0  . levothyroxine (SYNTHROID, LEVOTHROID) 75 MCG tablet TAKE 1 TABLET BY MOUTH DAILY BEFORE BREAKFAST. (Patient taking differently: Take 75 mcg by mouth daily before breakfast. ) 90 tablet 3  . pantoprazole (PROTONIX) 40 MG tablet Take 1 tablet (40 mg total) by mouth 2 (two) times daily. 180 tablet 5  . venlafaxine XR (EFFEXOR-XR) 75 MG 24 hr capsule TAKE ONE CAPSULE BY MOUTH EVERY DAY WITH BREAKFAST 90 capsule 1   No current facility-administered  medications for this visit.     Physical Exam:     Ht 5\' 5"  (1.651 m)   Wt 225 lb (102.1 kg)   LMP 12/15/2010   BMI 37.44 kg/m   Complete physical exam not completed due to the nature of this telehealth communication.   Gen: Awake, alert, and oriented, and well communicative. HEENT: EOMI, non-icteric sclera, NCAT, MMM Neck: Normal movement of head and neck Pulm: No labored breathing, speaking in full sentences  without conversational dyspnea Derm: No apparent lesions or bruising in visible field MS: Moves all visible extremities without noticeable abnormality Psych: Pleasant, cooperative, normal speech, thought processing seemingly intact   IMPRESSION and PLAN:     1) GERD with Erosive Esophagitis -Resume Protonix 40 mg p.o. twice daily for an additional 4 weeks (just restarted 2 days ago), then plan to titrate down to 40 mg predinner and continue to titrate to lowest effective dose -Discussed ongoing management of reflux, to include ongoing medications versus antireflux surgical options.  We will treat medically as above, but she is interested in antireflux surgical options should she need continued bid dosing -Given current BMI of 37.4, would currently preclude her from TIF.  Alternatively, discussed partial Nissen fundoplication, gastric bypass, etc.  Can discuss in further detail pending response to PPI titration as above  2) History of Tubular Adenomas: -Repeat colonoscopy in 2023  RTC in 3 months or sooner as needed         Lavena Bullion ,DO, FACG 02/27/2019, 10:57 AM

## 2019-02-27 NOTE — Patient Instructions (Signed)
Please follow up with Dr Bryan Lemma in 3 months.  If you are age 51 or older, your body mass index should be between 23-30. Your Body mass index is 37.44 kg/m. If this is out of the aforementioned range listed, please consider follow up with your Primary Care Provider.  If you are age 58 or younger, your body mass index should be between 19-25. Your Body mass index is 37.44 kg/m. If this is out of the aformentioned range listed, please consider follow up with your Primary Care Provider.

## 2019-03-05 ENCOUNTER — Other Ambulatory Visit: Payer: Self-pay | Admitting: Obstetrics and Gynecology

## 2019-03-05 DIAGNOSIS — Z9189 Other specified personal risk factors, not elsewhere classified: Secondary | ICD-10-CM

## 2019-03-09 ENCOUNTER — Other Ambulatory Visit: Payer: Self-pay

## 2019-03-09 ENCOUNTER — Inpatient Hospital Stay: Payer: 59 | Attending: Gynecologic Oncology | Admitting: Gynecologic Oncology

## 2019-03-09 ENCOUNTER — Encounter: Payer: Self-pay | Admitting: Gynecologic Oncology

## 2019-03-09 VITALS — BP 128/81 | HR 82 | Temp 98.2°F | Resp 18 | Ht 65.0 in | Wt 231.0 lb

## 2019-03-09 DIAGNOSIS — Z1501 Genetic susceptibility to malignant neoplasm of breast: Secondary | ICD-10-CM | POA: Diagnosis present

## 2019-03-09 DIAGNOSIS — Z90710 Acquired absence of both cervix and uterus: Secondary | ICD-10-CM | POA: Diagnosis not present

## 2019-03-09 DIAGNOSIS — Z90722 Acquired absence of ovaries, bilateral: Secondary | ICD-10-CM | POA: Diagnosis not present

## 2019-03-09 DIAGNOSIS — Z148 Genetic carrier of other disease: Secondary | ICD-10-CM | POA: Diagnosis not present

## 2019-03-09 DIAGNOSIS — Z1502 Genetic susceptibility to malignant neoplasm of ovary: Secondary | ICD-10-CM | POA: Insufficient documentation

## 2019-03-09 NOTE — Progress Notes (Signed)
Follow-up Note: Gyn-Onc  Consult was initially requested by Dr. Marylynn Pearson  CC:  Chief Complaint  Patient presents with  . Monoallelic mutation of BRIP1 gene    A&P:  [name]  is a very nice 53 y.o. P1 s/p robotic assisted BSO and lysis of adhesions for a hereditary increased risk for ovarian cancer (BRIP gene mutation) on 02/17/19. Marland Kitchen  She is doing well postop and will follow-up with Dr Julien Girt for ongoing wellness care.  Interval Hx:  On 02/17/19 she underwent a robotic assisted bilateral salpingo-oophorectomy and revision of umbilical incision.  Intraoperative findings were significant for a surgically absent uterus, adhesions between the omentum and undersurface of the low transverse incision in her pelvis.  A small incisional hernia at the low transverse incision containing peritoneal fat.  The final pathology revealed benign washings and benign tubes and ovaries bilaterally.    Measurement of disease:  Ca125 used a screening tool . 09/16/2017 = 6  Radiology: . 09/25/2017-transvaginal ultrasound-physician for women of [place]-no adnexal masses.  Current Meds:  Outpatient Encounter Medications as of 03/09/2019  Medication Sig  . Ascorbic Acid (VITAMIN C) 1000 MG tablet Take 1,000 mg by mouth every evening.  Marland Kitchen BIOTIN PO Take 1 tablet by mouth daily.  . Cholecalciferol (VITAMIN D) 50 MCG (2000 UT) tablet Take 2,000 Units by mouth daily.  Marland Kitchen ibuprofen (ADVIL) 800 MG tablet Take 1 tablet (800 mg total) by mouth every 8 (eight) hours as needed for moderate pain. For AFTER surgery  . levothyroxine (SYNTHROID, LEVOTHROID) 75 MCG tablet TAKE 1 TABLET BY MOUTH DAILY BEFORE BREAKFAST. (Patient taking differently: Take 75 mcg by mouth daily before breakfast. )  . pantoprazole (PROTONIX) 40 MG tablet Take 1 tablet (40 mg total) by mouth 2 (two) times daily.  Marland Kitchen venlafaxine XR (EFFEXOR-XR) 75 MG 24 hr capsule TAKE ONE CAPSULE BY MOUTH EVERY DAY WITH BREAKFAST   No  facility-administered encounter medications on file as of 03/09/2019.     Allergy: No Known Allergies  Social Hx:   Social History   Socioeconomic History  . Marital status: Married    Spouse name: Not on file  . Number of children: 1  . Years of education: Not on file  . 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 Financial resource strain: Not on file  . Food insecurity    Worry: Not on file    Inability: Not on file  . Transportation needs    Medical: Not on file    Non-medical: Not on file  Tobacco Use  . Smoking status: Never Smoker  . Smokeless tobacco: Never Used  Substance and Sexual Activity  . Alcohol use: Yes    Comment: occasionally   . Drug use: No  . Sexual activity: Yes    Birth control/protection: Surgical  Lifestyle  . Physical activity    Days per week: Not on file    Minutes per session: Not on file  . Stress: Not on file  Relationships  . Social Herbalist on phone: Not on file    Gets together: Not on file    Attends religious service: Not on file    Active member of club or organization: Not on file    Attends meetings of clubs or organizations: Not on file    Relationship status: Not on file  . Intimate partner violence    Fear of current  or ex partner: Not on file    Emotionally abused: Not on file    Physically abused: Not on file    Forced sexual activity: Not on file  Other Topics Concern  . Not on file  Social History Narrative  . Not on file    Past Surgical Hx:  Past Surgical History:  Procedure Laterality Date  . ABDOMINAL HYSTERECTOMY  03/04/2012   Procedure: HYSTERECTOMY ABDOMINAL;  Surgeon: Marylynn Pearson, MD;  Location: [facility];  Service: Gynecology;  Laterality: N/A;  with lysis of adhesions  . BASAL CELL CARCINOMA EXCISION Left    cheek/face  . CESAREAN SECTION  2002   HELLP Syndrome/ one time  . COLONOSCOPY     SAME TIME AS ENDO   . DILATION AND  CURETTAGE OF UTERUS  2007   Miscarriage  . LAPAROSCOPIC LYSIS OF ADHESIONS N/A 02/17/2019   Procedure: LAPAROSCOPIC LYSIS OF ADHESIONS;  Surgeon: Everitt Amber, MD;  Location: [facility];  Service: Gynecology;  Laterality: N/A;  . LAPAROSCOPIC TUBAL LIGATION  01/03/2011   Procedure: LAPAROSCOPIC TUBAL LIGATION;  Surgeon: Marylynn Pearson;  Location: [facility];  Service: Gynecology;  Laterality: Bilateral;  with filshie clips  . LAPAROSCOPY  03/04/2012   Procedure: LAPAROSCOPY DIAGNOSTIC;  Surgeon: Marylynn Pearson, MD;  Location: [facility];  Service: Gynecology;  Laterality: N/A;  with lysis of adhesions  . ROBOTIC ASSISTED BILATERAL SALPINGO OOPHERECTOMY Bilateral 02/17/2019   Procedure: XI ROBOTIC ASSISTED BILATERAL SALPINGO OOPHORECTOMY;  Surgeon: Everitt Amber, MD;  Location: [facility];  Service: Gynecology;  Laterality: Bilateral;  . SHOULDER ARTHROSCOPY  2012   right  . SVT ABLATION N/A 12/17/2018   Procedure: SVT ABLATION;  Surgeon: Evans Lance, MD;  Location: [facility];  Service: Cardiovascular;  Laterality: N/A;  . TONSILLECTOMY    . UPPER GI ENDOSCOPY      Past Medical Hx:  Past Medical History:  Diagnosis Date  . Arthritis    hips    . BCC (basal cell carcinoma), face    skin   . Concussion with no loss of consciousness 12/28/2014  . Depression with anxiety 10/06/2012  . Diabetes mellitus without complication ([place])    no meds--- reports as borderline , last a1c 01-05-2019 with PCP 6.3%  . GERD (gastroesophageal reflux disease) 09/05/2015   MGD WITH PPI   . Headache(784.0)    otc meds prn, migraine, NOW 1-2 TIMES ANNUALLY   . Hyperlipidemia, mixed 12/28/2014  . Hypertension    only with preg. 28yr ago 2002- no meds  . Hypothyroid 06/15/2012  . Obesity 09/05/2015  . Post-operative nausea and vomiting   . SVT [place]ricular tachycardia) [place]   . Tinea pedis 12/28/2014    Past Gynecological History:   GYNECOLOGIC HISTORY:  Patient's last menstrual period was 12/15/2010. Menarche:  53years old P 1 LMP 2013 at time of hysterectomy Contraceptive oral contraceptives approximately 10 years HRT none  Last Pap states January 2019 however hysterectomy was performed for non-dysplastic reasons.  Family Hx:  Family History  Problem Relation Age of Onset  . Hypertension Mother   . Depression Mother   . Breast cancer Mother 542 . Lung cancer Mother        lung (06-13-11)  . Basal cell carcinoma Brother 40  . Other Maternal Grandmother        spinal stenosis  . Emphysema Maternal Grandfather   . Prostate cancer Maternal Grandfather   . Breast cancer Cousin 478 . Colon cancer Neg Hx   .  Esophageal cancer Neg Hx     Review of Systems:  Review of Systems  Constitutional: Negative.   HENT:  Negative.   Eyes: Negative.   Respiratory: Negative.   Cardiovascular: Negative.   Gastrointestinal: Negative.   Endocrine: Negative.   Genitourinary: Negative.    Musculoskeletal: Negative.   Skin: Negative.   Neurological: Negative.   Hematological: Negative.   Psychiatric/Behavioral: Negative.      Vitals:  Blood pressure 128/81, pulse 82, temperature 98.2 F (36.8 C), temperature source Temporal, resp. rate 18, height _0  (1.651 m), weight 231 lb (104.8 kg), last menstrual period 12/15/2010, SpO2 98 %. Body mass index is 38.44 kg/m.   Physical Exam: ECOG PERFORMANCE STATUS: 0 - Asymptomatic   General :  Well developed, 53 y.o., female in no apparent distress HEENT:  Normocephalic/atraumatic, symmetric, EOMI, eyelids normal Neck:   Supple, no masses.  Lymphatics:  No cervical/ submandibular/ supraclavicular/ infraclavicular/ inguinal adenopathy Respiratory:  Respirations unlabored, no use of accessory muscles CV:   Deferred Breast:  Deferred Musculoskeletal: No CVA tenderness, normal muscle strength. Abdomen:  Overweight, soft, non-tender and nondistended. No evidence of hernia. No masses. Well healed incisions.  Extremities:  No lymphedema, no erythema,  non-tender. Skin:   Normal inspection Neuro/Psych:  No focal motor deficit, no abnormal mental status. Normal gait. Normal affect. Alert and oriented to person, place, and time  Genito Urinary: deferred Bimanual exam:  Uterus: Surgically absent  Adnexa: No masses. Rectovaginal:  Deferred  Oncologic Summary: 1. Genetic predisposition to breast and ovarian cancer    [name], MD  03/09/2019, 2:37 PM  Cc: Marylynn Pearson, MD (Referring OB/GYN) Vivien Rossetti, MD (PCP)

## 2019-03-09 NOTE — Patient Instructions (Signed)
Dr Denman George is clearing you for all activities in 1 week. It is safe to bathe/swim.  Please follow-up with Dr Julien Girt once a year for wellness checks.   Dr Serita Grit office can be reached at [phone] with questions.

## 2019-03-12 ENCOUNTER — Other Ambulatory Visit: Payer: 59

## 2019-03-18 ENCOUNTER — Other Ambulatory Visit: Payer: Self-pay | Admitting: Obstetrics and Gynecology

## 2019-03-18 ENCOUNTER — Ambulatory Visit
Admission: RE | Admit: 2019-03-18 | Discharge: 2019-03-18 | Disposition: A | Discharge status: Home / Self Care | Payer: 59 | Source: Ambulatory Visit | Attending: Obstetrics and Gynecology | Admitting: Obstetrics and Gynecology

## 2019-03-18 ENCOUNTER — Other Ambulatory Visit: Payer: Self-pay

## 2019-03-18 DIAGNOSIS — Z9189 Other specified personal risk factors, not elsewhere classified: Secondary | ICD-10-CM

## 2019-03-18 DIAGNOSIS — N644 Mastodynia: Secondary | ICD-10-CM

## 2019-04-10 ENCOUNTER — Telehealth: Payer: Self-pay | Admitting: Internal Medicine

## 2019-04-10 NOTE — Telephone Encounter (Signed)
Message routed to MD already to advise. Will await response.

## 2019-04-10 NOTE — Telephone Encounter (Signed)
Spoke with Pt.  Advised per last visit with Dr. Lovena Le he was unsure if she had hypertension and had advised Pt to wean off diltiazem.  Then advised if Pt noticed her blood pressure increasing she should either restart diltiazem or another medication.  Pt is to follow up with [facility].  Dr. Stanford Breed is Pt's primary cardiologist.  We discussed if she wanted to restart diltiazem and then schedule f/u with Dr. Stanford Breed or if she would like this nurse to send him a message to see what he would advise she take for hypertension.  Per Pt she would like to wait and see what Dr. Stanford Breed would like her to take.  She asked if she needed to worry while she waited to hear back from Dr. Stanford Breed.  Advised Pt to really decrease her salt over the weekend which should help with her blood pressure while she waits for advisement.  Pt indicates understanding.  Forwarding to Dr. Artemio Aly.

## 2019-04-10 NOTE — Telephone Encounter (Signed)
New Message   Pt c/o BP issue:  1. What are your last 5 BP readings? 144/101, 165/104, 142/101, 150/100, 127/94(this one was a couple of weeks ago) 2. Are you having any other symptoms (ex. Dizziness, headache, blurred vision, passed out)? Some dizziness, slight headache, upon standing she sometimes feels dizzy 3. What is your medication issue? Patient states that she stop taking the dilitazem in late June but she has noticed her BP is going up.

## 2019-04-13 MED ORDER — AMLODIPINE BESYLATE 5 MG PO TABS: 5.0000 mg | ORAL_TABLET | Freq: Every day | ORAL | 11 refills | Status: DC

## 2019-04-13 NOTE — Telephone Encounter (Signed)
Spoke with pt and would like to try another B/P med if possible Pt feels the Diltiazem makes her feel sluggish Will forward to Dr Stanford Breed for review and recommendations ./cy

## 2019-04-13 NOTE — Telephone Encounter (Signed)
Amlodipine 5 mg daily and follow BP Kirk [facility]s

## 2019-04-13 NOTE — Telephone Encounter (Addendum)
Pt aware new script sent to [facility] via epic ./cy

## 2019-04-13 NOTE — Telephone Encounter (Signed)
Resume cardizem and follow BP Kirk Ruths

## 2019-05-12 ENCOUNTER — Other Ambulatory Visit: Payer: Self-pay | Admitting: Chiropractic Medicine

## 2019-05-12 DIAGNOSIS — M25551 Pain in right hip: Secondary | ICD-10-CM

## 2019-05-12 DIAGNOSIS — M25851 Other specified joint disorders, right hip: Secondary | ICD-10-CM

## 2019-05-12 DIAGNOSIS — M25852 Other specified joint disorders, left hip: Secondary | ICD-10-CM

## 2019-05-12 DIAGNOSIS — M25552 Pain in left hip: Secondary | ICD-10-CM

## 2019-05-15 ENCOUNTER — Ambulatory Visit: Payer: 59 | Admitting: Gastroenterology

## 2019-06-02 ENCOUNTER — Emergency Department (HOSPITAL_BASED_OUTPATIENT_CLINIC_OR_DEPARTMENT_OTHER)
Admission: EM | Admit: 2019-06-02 | Discharge: 2019-06-03 | Disposition: A | Discharge status: Home / Self Care | Payer: 59 | Attending: Emergency Medicine | Admitting: Emergency Medicine

## 2019-06-02 ENCOUNTER — Encounter (HOSPITAL_BASED_OUTPATIENT_CLINIC_OR_DEPARTMENT_OTHER): Payer: Self-pay | Admitting: Emergency Medicine

## 2019-06-02 ENCOUNTER — Other Ambulatory Visit: Payer: Self-pay | Admitting: Family Medicine

## 2019-06-02 ENCOUNTER — Telehealth: Payer: 59 | Admitting: Physician Assistant

## 2019-06-02 ENCOUNTER — Other Ambulatory Visit: Payer: Self-pay

## 2019-06-02 DIAGNOSIS — I10 Essential (primary) hypertension: Secondary | ICD-10-CM | POA: Diagnosis not present

## 2019-06-02 DIAGNOSIS — E119 Type 2 diabetes mellitus without complications: Secondary | ICD-10-CM | POA: Diagnosis not present

## 2019-06-02 DIAGNOSIS — R197 Diarrhea, unspecified: Secondary | ICD-10-CM | POA: Diagnosis present

## 2019-06-02 DIAGNOSIS — Z79899 Other long term (current) drug therapy: Secondary | ICD-10-CM | POA: Insufficient documentation

## 2019-06-02 DIAGNOSIS — E039 Hypothyroidism, unspecified: Secondary | ICD-10-CM | POA: Diagnosis not present

## 2019-06-02 LAB — CBC
HCT: 45.1 % (ref 36.0–46.0)
Hemoglobin: 15 g/dL (ref 12.0–15.0)
MCH: 27.5 pg (ref 26.0–34.0)
MCHC: 33.3 g/dL (ref 30.0–36.0)
MCV: 82.8 fL (ref 80.0–100.0)
Platelets: 378 10*3/uL (ref 150–400)
RBC: 5.45 MIL/uL — ABNORMAL HIGH (ref 3.87–5.11)
RDW: 13.2 % (ref 11.5–15.5)
WBC: 14.1 10*3/uL — ABNORMAL HIGH (ref 4.0–10.5)
nRBC: 0 % (ref 0.0–0.2)

## 2019-06-02 LAB — COMPREHENSIVE METABOLIC PANEL
ALT: 25 U/L (ref 0–44)
AST: 23 U/L (ref 15–41)
Albumin: 4.3 g/dL (ref 3.5–5.0)
Alkaline Phosphatase: 79 U/L (ref 38–126)
Anion gap: 12 (ref 5–15)
BUN: 13 mg/dL (ref 6–20)
CO2: 23 mmol/L (ref 22–32)
Calcium: 9.3 mg/dL (ref 8.9–10.3)
Chloride: 100 mmol/L (ref 98–111)
Creatinine, Ser: 0.85 mg/dL (ref 0.44–1.00)
GFR calc Af Amer: >60 mL/min (ref >60)
GFR calc non Af Amer: >60 mL/min (ref >60)
Glucose, Bld: 123 mg/dL — ABNORMAL HIGH (ref 70–99)
Potassium: 3.7 mmol/L (ref 3.5–5.1)
Sodium: 135 mmol/L (ref 135–145)
Total Bilirubin: 0.9 mg/dL (ref 0.3–1.2)
Total Protein: 8 g/dL (ref 6.5–8.1)

## 2019-06-02 LAB — URINALYSIS, ROUTINE W REFLEX MICROSCOPIC
Glucose, UA: NEGATIVE mg/dL
Ketones, ur: NEGATIVE mg/dL
Leukocytes,Ua: NEGATIVE
Nitrite: NEGATIVE
Protein, ur: NEGATIVE mg/dL
Specific Gravity, Urine: >1.03 — ABNORMAL HIGH (ref 1.005–1.030)
pH: 6 (ref 5.0–8.0)

## 2019-06-02 LAB — URINALYSIS, MICROSCOPIC (REFLEX)

## 2019-06-02 LAB — LIPASE, BLOOD: Lipase: 27 U/L (ref 11–51)

## 2019-06-02 MED ORDER — ONDANSETRON 4 MG PO TBDP: 4.0000 mg | ORAL_TABLET | Freq: Three times a day (TID) | ORAL | 0 refills | Status: DC | PRN

## 2019-06-02 MED ORDER — CIPROFLOXACIN HCL 500 MG PO TABS: 500.0000 mg | ORAL_TABLET | Freq: Two times a day (BID) | ORAL | 0 refills | Status: AC

## 2019-06-02 NOTE — ED Notes (Signed)
Pt given graham crackers and peanut butter with diet coke 

## 2019-06-02 NOTE — Progress Notes (Signed)
Based on what you shared with me, I feel your condition warrants further evaluation and I recommend that you be seen for a face to face office visit.   Given your symptoms have been ongoing for almost a week, you are feeling weak, and are experiencing abdominal pain with your symptoms I would recommend seeking further evaluation. It would be reasonable to be seen at your PCP's office, an urgent care, or emergency department. You may need bloodwork, IV fluids, and possibly imaging if your provider feels these are indicated. Some individuals with COVID are also presenting with only GI symptoms, so they may recommend getting tested for this.    NOTE: If you entered your credit card information for this eVisit, you will not be charged. You may see a "hold" on your card for the $35 but that hold will drop off and you will not have a charge processed.   If you are having a true medical emergency please call 911.      For an urgent face to face visit, [facility] has five urgent care centers for your convenience:      NEW:  [facility] [place] [facility] [place] Get Driving Directions [id number] [place], [place] [address] . 10 am - 6pm Monday - Friday    [facility]) Get Driving Directions [id number] [address] [place], [place] [address] . 10 am to 8 pm Monday-Friday . 12 pm to 8 pm [facility] at [facility] Get Driving Directions [id number] [place], [place], [place] [address] . 8 am to 8 pm Monday-Friday . 9 am to 6 pm Saturday . 11 am to 6 pm Sunday     [facility] at [facility] Get Driving Directions  [id number] [address] [place], [place] [address] . 8 am to 8 pm Monday-Friday . 8 am to 4 pm [facility] at [place] Get Driving Directions [id number] [address]  [place], [place] [address] . 12 pm to 6 pm Monday-Friday      Your e-visit answers were reviewed by a board certified advanced clinical practitioner to complete your personal care plan.  Thank you for using e-Visits.   Greater than 5 minutes, yet less than 10 minutes of time have been spent researching, coordinating, and implementing care for this patient today.

## 2019-06-02 NOTE — Discharge Instructions (Signed)
Recommend following up with your primary doctor as well as your gastroenterologist.  Your GI doctor is not able to see you within the next couple days, please call your primary doctor to be evaluated within this week.  Please take antibiotic as prescribed.  Recommend taking Zofran as needed for nausea.  Return to ER if you develop worsening abdominal pain, vomiting, blood in stools or fever.

## 2019-06-02 NOTE — ED Notes (Signed)
Pt attempted to provide a stool specimen but was unsuccessful

## 2019-06-02 NOTE — ED Triage Notes (Signed)
Watery diarrhea x 1 week. States she is having approximately 12 episodes per day.

## 2019-06-02 NOTE — ED Provider Notes (Addendum)
[place] EMERGENCY DEPARTMENT Provider Note   CSN: [id number] Arrival date & time: 06/02/19  1624     History Chief Complaint  Patient presents with  . Diarrhea    Ebony [name] is a 53 y.o. female.  Presents to the emergency department with chief complaint of diarrhea.  Patient states over the past week she has noted worsening diarrhea.  States diarrhea has been up to 12 times per day.  No blood, somewhat foul-smelling but not particularly.  Diarrhea appears watery, very loose.  Has had some abdominal cramping, particularly when having bowel movement.  Not currently having the abdominal pain.  No nausea or vomiting.  She denies any new food exposures, no consumption of unfiltered water.  No recent antibiotic use.  No history of abdominal surgeries.  No recent travel.  No dysuria or hematuria.  HPI     Past Medical History:  Diagnosis Date  . Arthritis    hips    . BCC (basal cell carcinoma), face    skin   . Concussion with no loss of consciousness 12/28/2014  . Depression with anxiety 10/06/2012  . Diabetes mellitus without complication ([place])    no meds--- reports as borderline , last a1c 01-05-2019 with PCP 6.3%  . GERD (gastroesophageal reflux disease) 09/05/2015   MGD WITH PPI   . Headache(784.0)    otc meds prn, migraine, NOW 1-2 TIMES ANNUALLY   . Hyperlipidemia, mixed 12/28/2014  . Hypertension    only with preg. 24yr ago 2002- no meds  . Hypothyroid 06/15/2012  . Obesity 09/05/2015  . Post-operative nausea and vomiting   . [place]) [place]   . Tinea pedis 12/28/2014    Patient Active Problem List   Diagnosis Date Noted  . Monoallelic mutation of BRIP1 gene 03/30/2018  . Ovarian cancer genetic susceptibility 10/28/2017  . BCC (basal cell carcinoma), face 08/30/2017  . Obesity 09/05/2015  . GERD (gastroesophageal reflux disease) 09/05/2015  . Type 2 diabetes mellitus without complication, without long-term current use of  insulin ([place] 06/20/2015  . Hyperlipidemia, mixed 12/28/2014  . Pain of left calf 10/01/2014  . Hip pain, acute 05/27/2013  . SV[place] ([place]ricular tachycardia) ([place] 05/06/2013  . Palpitations 01/11/2013  . Right shoulder pain 01/08/2013  . Neck pain 01/08/2013  . Depression with anxiety 10/06/2012  . Hypothyroid 06/15/2012  . Preventative health care 06/13/2012  . Headache     Past Surgical History:  Procedure Laterality Date  . ABDOMINAL HYSTERECTOMY  03/04/2012   Procedure: HYSTERECTOMY ABDOMINAL;  Surgeon: GMarylynn Pearson MD;  [facility];  Service: Gynecology;  Laterality: N/A;  with lysis of adhesions  . BASAL CELL CARCINOMA EXCISION Left    cheek/face  . CESAREAN SECTION  2002   HELLP Syndrome/ one time  . COLONOSCOPY     SAME TIME AS ENDO   . DILATION AND CURETTAGE OF UTERUS  2007   Miscarriage  . LAPAROSCOPIC LYSIS OF ADHESIONS N/A 02/17/2019   Procedure: LAPAROSCOPIC LYSIS OF ADHESIONS;  Surgeon: REveritt Amber MD;  [facility]: [facility];  Service: Gynecology;  Laterality: N/A;  . LAPAROSCOPIC TUBAL LIGATION  01/03/2011   Procedure: LAPAROSCOPIC TUBAL LIGATION;  Surgeon: GMarylynn Pearson  [facility];  Service: Gynecology;  Laterality: Bilateral;  with filshie clips  . LAPAROSCOPY  03/04/2012   Procedure: LAPAROSCOPY DIAGNOSTIC;  Surgeon: GMarylynn Pearson MD;  [facility];  Service: Gynecology;  Laterality: N/A;  with lysis of adhesions  . ROBOTIC ASSISTED BILATERAL SALPINGO OOPHERECTOMY Bilateral  02/17/2019   Procedure: XI ROBOTIC ASSISTED BILATERAL SALPINGO OOPHORECTOMY;  Surgeon: Everitt Amber, MD;  Location: [facility];  Service: Gynecology;  Laterality: Bilateral;  . SHOULDER ARTHROSCOPY  2012   right  . SVT ABLATION N/A 12/17/2018   Procedure: SVT ABLATION;  Surgeon: Evans Lance, MD;  Location: [facility];  Service: Cardiovascular;  Laterality: N/A;  . TONSILLECTOMY    . UPPER GI ENDOSCOPY       OB History   No obstetric history on file.      Family History  Problem Relation Age of Onset  . Hypertension Mother   . Depression Mother   . Breast cancer Mother 40  . Lung cancer Mother        lung (06-13-11)  . Basal cell carcinoma Brother 40  . Other Maternal Grandmother        spinal stenosis  . Emphysema Maternal Grandfather   . Prostate cancer Maternal Grandfather   . Breast cancer Cousin 59  . Colon cancer Neg Hx   . Esophageal cancer Neg Hx     Social History   Tobacco Use  . Smoking status: Never Smoker  . Smokeless tobacco: Never Used  Substance Use Topics  . Alcohol use: Yes    Comment: occasionally   . Drug use: No    Home Medications Prior to Admission medications   Medication Sig Start Date End Date Taking? Authorizing Provider  amLODipine (NORVASC) 5 MG tablet Take 1 tablet (5 mg total) by mouth daily. 04/13/19 05/13/19  Lelon Perla, MD  Ascorbic Acid (VITAMIN C) 1000 MG tablet Take 1,000 mg by mouth every evening.    [provider]  BIOTIN PO Take 1 tablet by mouth daily.    [provider]  Cholecalciferol (VITAMIN D) 50 MCG (2000 UT) tablet Take 2,000 Units by mouth daily.    [provider]  ciprofloxacin (CIPRO) 500 MG tablet Take 1 tablet (500 mg total) by mouth every 12 (twelve) hours for 3 days. 06/02/19 06/05/19  Lucrezia Starch, MD  ibuprofen (ADVIL) 800 MG tablet Take 1 tablet (800 mg total) by mouth every 8 (eight) hours as needed for moderate pain. For AFTER surgery 02/04/19   Joylene John D, NP  levothyroxine (SYNTHROID) 75 MCG tablet TAKE 1 TABLET BY MOUTH EVERY DAY BEFORE BREAKFAST 06/02/19   Mosie Lukes, MD  ondansetron (ZOFRAN ODT) 4 MG disintegrating tablet Take 1 tablet (4 mg total) by mouth every 8 (eight) hours as needed for nausea or vomiting. 06/02/19   Lucrezia Starch, MD  pantoprazole (PROTONIX) 40 MG tablet Take 1 tablet (40 mg total) by mouth 2 (two) times daily. 02/25/19   Cirigliano, Vito V, DO  venlafaxine XR (EFFEXOR-XR) 75 MG 24 hr  capsule TAKE ONE CAPSULE BY MOUTH EVERY DAY WITH BREAKFAST 02/12/19   Mosie Lukes, MD    Allergies    Patient has no known allergies.  Review of Systems   Review of Systems  Constitutional: Negative for chills and fever.  HENT: Negative for ear pain and sore throat.   Eyes: Negative for pain and visual disturbance.  Respiratory: Negative for cough and shortness of breath.   Cardiovascular: Negative for chest pain and palpitations.  Gastrointestinal: Positive for abdominal pain and diarrhea. Negative for vomiting.  Genitourinary: Negative for dysuria and hematuria.  Musculoskeletal: Negative for arthralgias and back pain.  Skin: Negative for color change and rash.  Neurological: Negative for seizures and syncope.  All other  systems reviewed and are negative.   Physical Exam Updated Vital Signs BP 103/76 (BP Location: Right Arm)   Pulse 98   Temp 98.8 F (37.1 C) (Oral)   Resp 16   Ht 5' 5" (1.651 m)   Wt 102.1 kg   LMP 12/15/2010   SpO2 94%   BMI 37.44 kg/m   Physical Exam Vitals and nursing note reviewed.  Constitutional:      General: She is not in acute distress.    Appearance: She is well-developed.  HENT:     Head: Normocephalic and atraumatic.  Eyes:     Conjunctiva/sclera: Conjunctivae normal.  Cardiovascular:     Rate and Rhythm: Normal rate and regular rhythm.     Heart sounds: No murmur.  Pulmonary:     Effort: Pulmonary effort is normal. No respiratory distress.     Breath sounds: Normal breath sounds.  Abdominal:     General: Bowel sounds are normal. There is no distension.     Palpations: Abdomen is soft.     Tenderness: There is no abdominal tenderness. There is no guarding or rebound.  Musculoskeletal:     Cervical back: Neck supple.  Skin:    General: Skin is warm and dry.     Capillary Refill: Capillary refill takes less than 2 seconds.  Neurological:     General: No focal deficit present.     Mental Status: She is alert.  Psychiatric:         Mood and Affect: Mood normal.        Behavior: Behavior normal.     ED Results / Procedures / Treatments   Labs (all labs ordered are listed, but only abnormal results are displayed) Labs Reviewed  COMPREHENSIVE METABOLIC PANEL - Abnormal; Notable for the following components:      Result Value   Glucose, Bld 123 (*)    All other components within normal limits  CBC - Abnormal; Notable for the following components:   WBC 14.1 (*)    RBC 5.45 (*)    All other components within normal limits  URINALYSIS, ROUTINE W REFLEX MICROSCOPIC - Abnormal; Notable for the following components:   Specific Gravity, Urine >1.030 (*)    Hgb urine dipstick SMALL (*)    Bilirubin Urine SMALL (*)    All other components within normal limits  URINALYSIS, MICROSCOPIC (REFLEX) - Abnormal; Notable for the following components:   Bacteria, UA MANY (*)    All other components within normal limits  GI PATHOGEN PANEL BY PCR, STOOL  C DIFFICILE QUICK SCREEN W PCR REFLEX  LIPASE, BLOOD    EKG None  Radiology No results found.  Procedures Procedures (including critical care time)  Medications Ordered in ED Medications - No data to display  ED Course  I have reviewed the triage vital signs and the nursing notes.  Pertinent labs & imaging results that were available during my care of the patient were reviewed by me and considered in my medical decision making (see chart for details).    MDM Rules/Calculators/A&P                      53 year old lady presents to ER with severe diarrhea for 1 week.  On exam she is remarkably well-appearing with normal vital signs.  Her abdomen is completely benign, no focal tenderness noted.  Normal bowel sounds.  Recommended checking stool panel as well as C. difficile although she does not have any specific C.  difficile risk factors.  No leukocytosis.  Given severity of diarrhea, will empirically treat with ciprofloxacin.  Gave Zofran for symptomatic  control.  She has been evaluated by GI before for gastritis.  I recommended she have close follow-up with both her gastroenterologist and her primary care doctor.  Specifically asked her to get an appointment with one of them within the next couple days to follow-up on the results of her stool panel as I do not anticipate these will result tonight.  Noted bacteria in urine - she has no urinary symptoms, nitrites and leukocytes negative, doubt UTI. She is tolerating p.o. without difficulty, her electrolytes are stable, believe she is appropriate for discharge and outpatient management this time.  Will discharge home.    After the discussed management above, the patient was determined to be safe for discharge.  The patient was in agreement with this plan and all questions regarding their care were answered.  ED return precautions were discussed and the patient will return to the ED with any significant worsening of condition.   Final Clinical Impression(s) / ED Diagnoses Final diagnoses:  Diarrhea, unspecified type    Rx / DC Orders ED Discharge Orders         Ordered    ondansetron (ZOFRAN ODT) 4 MG disintegrating tablet  Every 8 hours PRN     06/02/19 2159    ciprofloxacin (CIPRO) 500 MG tablet  Every 12 hours     06/02/19 2159           Lucrezia Starch, MD 06/02/19 2320    Lucrezia Starch, MD 06/02/19 [id number]

## 2019-06-03 NOTE — ED Notes (Signed)
Pt attempted to give stool specimen without success

## 2019-06-04 ENCOUNTER — Ambulatory Visit
Admission: RE | Admit: 2019-06-04 | Discharge: 2019-06-04 | Disposition: A | Discharge status: Home / Self Care | Payer: 59 | Source: Ambulatory Visit | Attending: Chiropractic Medicine | Admitting: Chiropractic Medicine

## 2019-06-04 ENCOUNTER — Other Ambulatory Visit: Payer: Self-pay

## 2019-06-04 DIAGNOSIS — M25551 Pain in right hip: Secondary | ICD-10-CM

## 2019-06-04 DIAGNOSIS — M25852 Other specified joint disorders, left hip: Secondary | ICD-10-CM

## 2019-06-04 DIAGNOSIS — M25552 Pain in left hip: Secondary | ICD-10-CM

## 2019-06-04 DIAGNOSIS — M25851 Other specified joint disorders, right hip: Secondary | ICD-10-CM

## 2019-06-04 MED ORDER — IOPAMIDOL (ISOVUE-M 200) INJECTION 41%
15.0000 mL | Freq: Once | INTRAMUSCULAR | Status: AC
  Administered 2019-06-04: 15 mL via INTRA_ARTICULAR

## 2019-06-10 ENCOUNTER — Other Ambulatory Visit: Payer: Self-pay

## 2019-06-10 ENCOUNTER — Ambulatory Visit: Payer: 59 | Admitting: Gastroenterology

## 2019-06-10 ENCOUNTER — Encounter: Payer: Self-pay | Admitting: Gastroenterology

## 2019-06-10 VITALS — BP 130/86 | HR 77 | Temp 97.7°F | Ht 65.0 in | Wt 237.1 lb

## 2019-06-10 DIAGNOSIS — Z860100 Personal history of colon polyps, unspecified: Secondary | ICD-10-CM

## 2019-06-10 DIAGNOSIS — Z6839 Body mass index (BMI) 39.0-39.9, adult: Secondary | ICD-10-CM

## 2019-06-10 DIAGNOSIS — K2100 Gastro-esophageal reflux disease with esophagitis, without bleeding: Secondary | ICD-10-CM

## 2019-06-10 DIAGNOSIS — Z7189 Other specified counseling: Secondary | ICD-10-CM

## 2019-06-10 DIAGNOSIS — E669 Obesity, unspecified: Secondary | ICD-10-CM

## 2019-06-10 NOTE — Progress Notes (Signed)
P  Chief Complaint:    GERD  GI History: 54 year old female with GERD complicated by erosive esophagitis noted on EGD in 11/2018.  Index symptoms of HB and regurgitation. Wasinitiallywell controlled with Zantac, changed to Pepcid due to carcinogen profile.  EGD in 11/2018 with LA Grade A esophagitis, treated with Protonix 40 mg p.o. twice daily x4 weeks, with excellent clinical response, and resolution of index reflux symptoms.  GERD-HRQL Questionnaire score: 24 in 05/2018  Endoscopic history: -EGD (11/2018, Dr. Bryan Lemma): LA Grade A esophagitis, otherwise normal -Colonoscopy (11/2018, Dr. Bryan Lemma): 4 tubular adenoma polyps, largest 10 mm.  Tortuous colon.  Repeat in 3 years for ongoing surveillance.  HPI:     Patient is a 54 y.o. female presenting to the Gastroenterology Clinic for follow-up.  Last seen by me in 02/2019 (telehealth visit).  At that time reflux was well controlled since starting Protonix 40 mg bid.  Did not tolerate trial coming off Protonix with immediate recurrence of index symptoms (HB, regurgitation, nocturnal symptoms).  Plan at that time was to resume BID dosing, and titrate down to daily dosing/lowest effective dose.  BMI 37.4 at that time, prohibitive of TIF.  Today, she states she continues to require Protonix BID. Will have nocturnal breakthrough when decreasing to daily dosing.  Otherwise, no dysphagia.  Tolerating all p.o. intake.  She was seen in the ER for acute onset diarrhea x7 days on 06/02/2019.  Aside from leukocytosis (14.1), evaluation largely unrevealing, to include normal CBC, CMP, lipase.  Treated with ciprofloxacin for presumed infectious gastroenteritis, and symptoms have since resolved.  Review of systems:     No chest pain, no SOB, no fevers, no urinary sx   Past Medical History:  Diagnosis Date  . Arthritis    hips    . BCC (basal cell carcinoma), face    skin   . Concussion with no loss of consciousness 12/28/2014  . Depression  with anxiety 10/06/2012  . Diabetes mellitus without complication ([place])    no meds--- reports as borderline , last a1c 01-05-2019 with PCP 6.3%  . GERD (gastroesophageal reflux disease) 09/05/2015   MGD WITH PPI   . Headache(784.0)    otc meds prn, migraine, NOW 1-2 TIMES ANNUALLY   . Hyperlipidemia, mixed 12/28/2014  . Hypertension    only with preg. 23yrs ago 2002- no meds  . Hypothyroid 06/15/2012  . Obesity 09/05/2015  . Post-operative nausea and vomiting   . SVT (supraventricular tachycardia) ([place])   . Tinea pedis 12/28/2014    Patient's surgical history, family medical history, social history, medications and allergies were all reviewed in Epic    Current Outpatient Medications  Medication Sig Dispense Refill  . amLODipine (NORVASC) 5 MG tablet Take 1 tablet (5 mg total) by mouth daily. 30 tablet 11  . Ascorbic Acid (VITAMIN C) 1000 MG tablet Take 1,000 mg by mouth every evening.    . Cholecalciferol (VITAMIN D) 50 MCG (2000 UT) tablet Take 2,000 Units by mouth daily.    Marland Kitchen levothyroxine (SYNTHROID) 75 MCG tablet TAKE 1 TABLET BY MOUTH EVERY DAY BEFORE BREAKFAST 90 tablet 3  . pantoprazole (PROTONIX) 40 MG tablet Take 1 tablet (40 mg total) by mouth 2 (two) times daily. 180 tablet 5  . venlafaxine XR (EFFEXOR-XR) 75 MG 24 hr capsule TAKE ONE CAPSULE BY MOUTH EVERY DAY WITH BREAKFAST 90 capsule 1   No current facility-administered medications for this visit.    Physical Exam:     BP 130/86  Pulse 77   Temp 97.7 F (36.5 C)   Ht 5\' 5"  (1.651 m)   Wt 237 lb 2 oz (107.6 kg)   LMP 12/15/2010   BMI 39.46 kg/m   GENERAL:  Pleasant female in NAD PSYCH: : Cooperative, normal affect EENT:  conjunctiva pink, mucous membranes moist, neck supple without masses SKIN:  turgor, no lesions seen Musculoskeletal:  Normal muscle tone, normal strength NEURO: Alert and oriented x 3, no focal neurologic deficits   IMPRESSION and PLAN:    1) GERD with Erosive Esophagitis -Resume current  PPI -Resume antireflux lifestyle/dietary modifications -No plan for surgical intervention prolonged discussion with patient.  If she does want to proceed with antireflux surgery, current BMI (39.46) would be prohibitive of TIF -She is planning on losing weight as a means to improve her reflux  2) Infectious gastroenteritis: -Resolved with course of Cipro without any recurrence.  No additional work-up needed at this time  3) Chronic acid suppression therapy I have reviewed the indications, risks, and benefits of PPI therapy with the patient today. I have discussed studies that raise ? of increased osteoporosis, dementia, and kidney failure and explained that these studies show very weak associations of unclear significance and not clear cause and effect. We did discuss the potential for vitamin malabsorption, to include magnesium (very rare), calcium (easily modifiable with Calcium Citrate supplement), vitamin B12 (again, correctable with oral B12 supplement), and iron (although rarely clinically significant outside patients who require iron supplementation previously), and can monitor each of these periodically with routine labs. We have agreed to continue PPI treatment in this case.  4) History of colon polyps: -Colonoscopy 2020 with 3 subcentimeter tubular adenomas -Repeat colonoscopy in 2023 for ongoing polyp surveillance  5) Obesity: -Current BMI 39.46 and contributing to reflux as above   RTC in 6-12 months or [place] prn     I spent 30-39 minutes of time, including in depth chart review, independent review of results as outlined above, communicating results with the patient directly, face-to-face time with the patient, coordinating care, and ordering studies and medications as appropriate. Greater than 50% of the time was spent counseling and coordinating care.       [place] ,DO, FACG 06/10/2019, 2:07 PM

## 2019-06-10 NOTE — Patient Instructions (Signed)
Follow up in 6-12 months  It was a pleasure to see you today!  Vito Cirigliano, D.O.

## 2019-06-30 IMAGING — MR MR BREAST BILAT WO/W CM
8 of 12 series · 33 of 48 positions shown · IV contrast (20ml Multihance)
Comparison: None. Correlation made to prior mammograms and
ultrasounds.

CLINICAL DATA: High risk screening MRI for a 51-year-old female
with lifetime risk of breast cancer determined to be 37.6%. The
patient's mother had breast cancer at the age of 50, and a first
cousin diagnosed at the age of 40. The patient is BRIP 1 gene
carrier.

LABS:  Not applicable.
EXAM:
BILATERAL BREAST MRI WITH AND WITHOUT CONTRAST
TECHNIQUE: Multiplanar, multisequence MR images of both breasts were obtained
prior to and following the intravenous administration of 20 ml of
MultiHance.

[Series 2: t2_tirm_tra ipat (a-p) · axial · 3.0mm · 0.70mm/px · 1 of 57 slices shown]
[im 1/57]
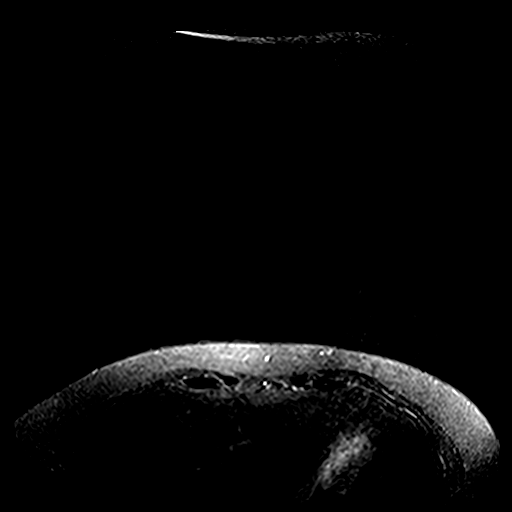

[Series 3: fl3d pre-cm no · axial · non-contrast · 1.2mm · 0.94mm/px · z∈[-96,+95]mm · 5 of 160 slices shown]
[im 1/160]
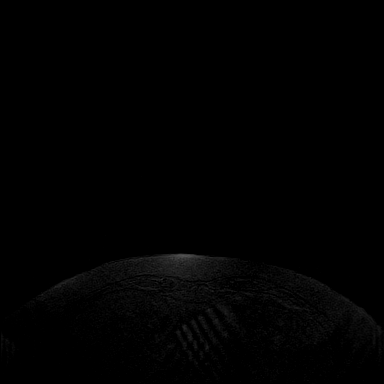
[im 40/160]
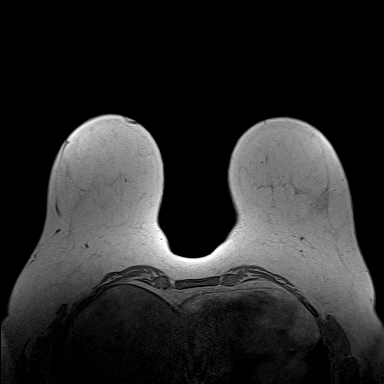
[im 80/160]
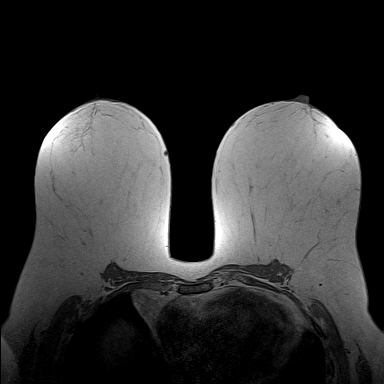
[im 120/160]
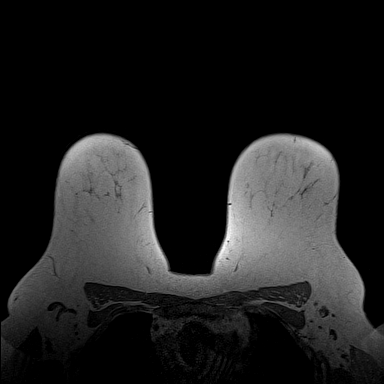
[im 160/160]
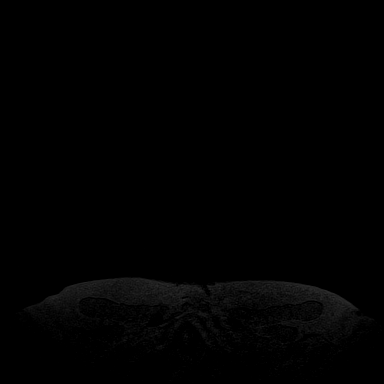

[Series 4: fl3d pre-cm · axial · non-contrast · 1.2mm · 0.94mm/px · z∈[-96,+95]mm · 5 of 160 slices shown]
[im 1/160]
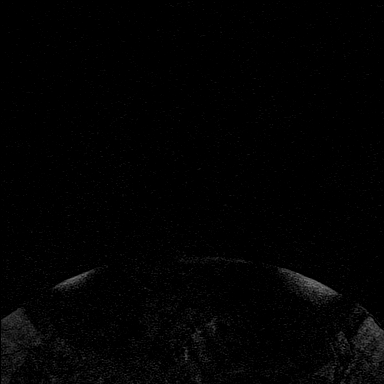
[im 40/160]
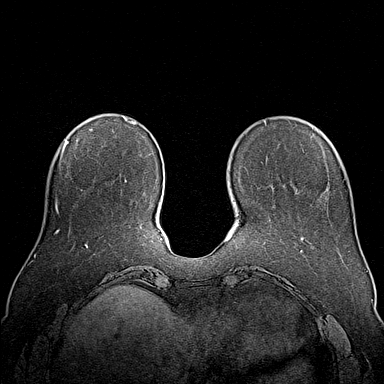
[im 80/160]
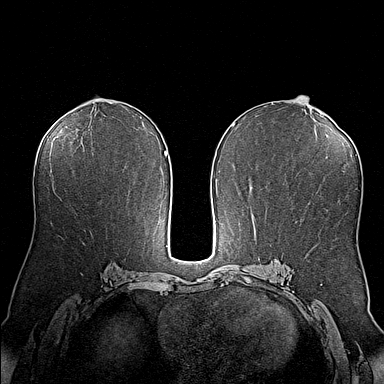
[im 120/160]
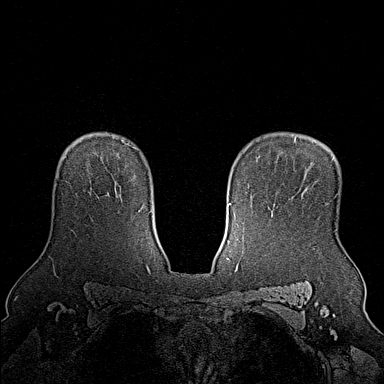
[im 160/160]
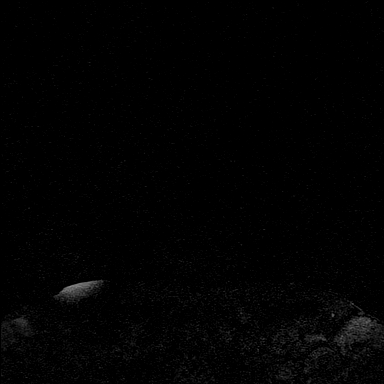

[Series 5: fl3d post-cm 20 · axial · 1.2mm · 0.94mm/px · z∈[-96,+95]mm · 5 of 160 slices shown (1 of 3)]
[im 1/160]
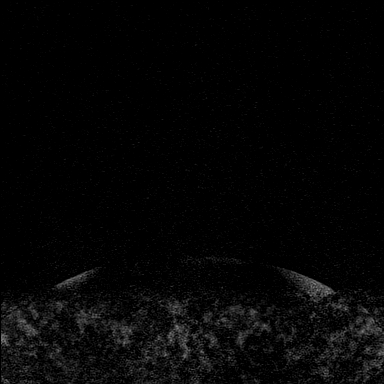
[im 40/160]
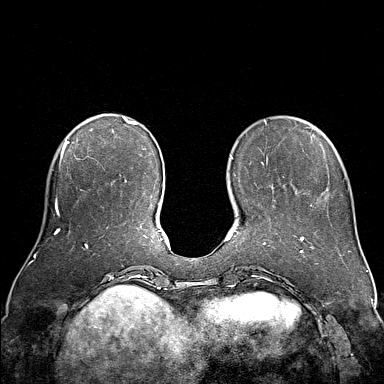
[im 80/160]
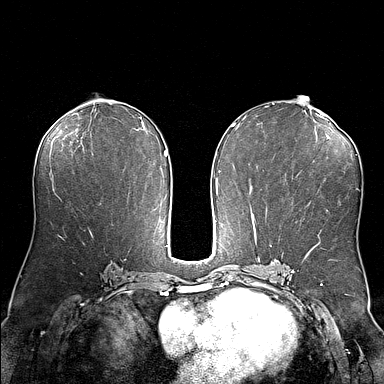
[im 120/160]
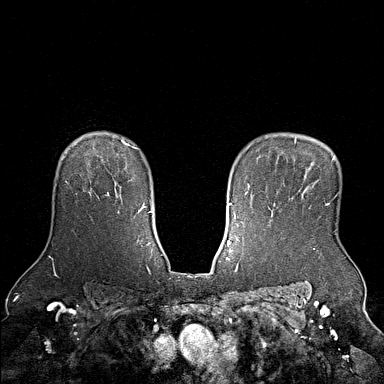
[im 160/160]
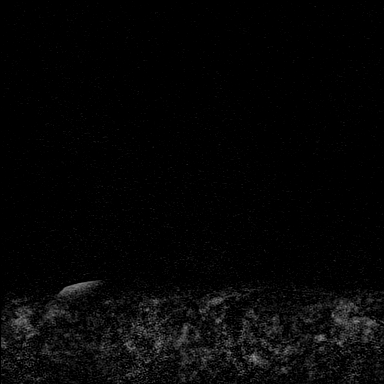

[Series 6: fl3d post-cm 20 · axial · 1.2mm · 0.94mm/px · z∈[-96,+95]mm · 5 of 160 slices shown (2 of 3)]
[im 1/160]
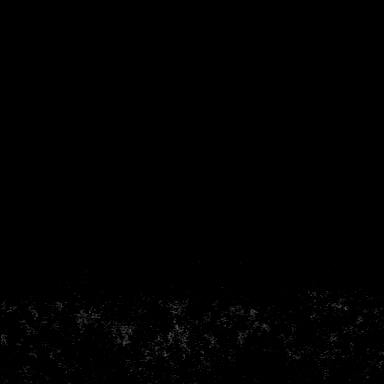
[im 40/160]
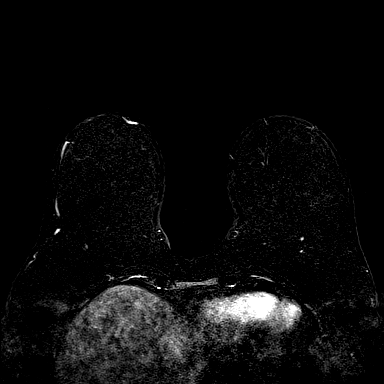
[im 80/160]
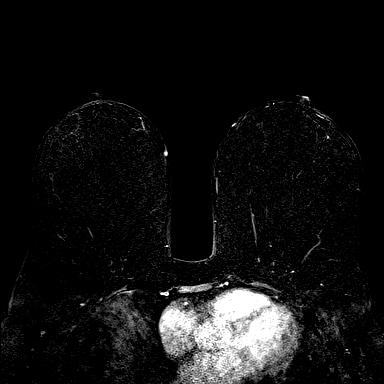
[im 120/160]
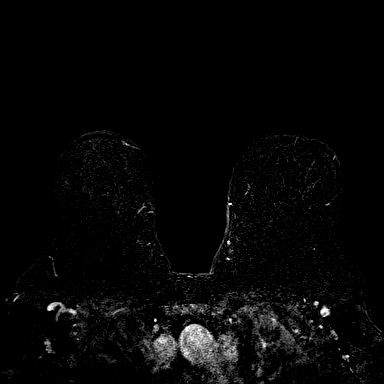
[im 160/160]
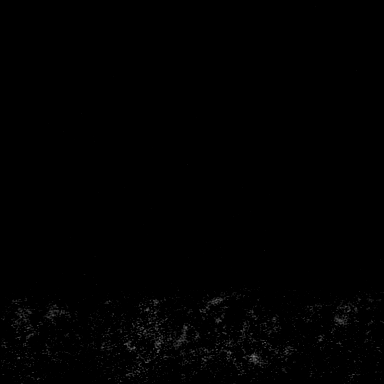

[Series 7: fl3d post-cm 20 · axial · 192.0mm · 0.94mm/px · 1 of 1 slices shown (3 of 3)]
[im 1/1]
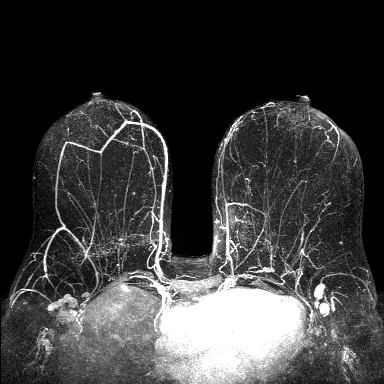

[Series 8: fl3d post-cm 3min · axial · 1.2mm · 0.94mm/px · z∈[-96,+95]mm · 6 of 160 slices shown]
[im 1/160]
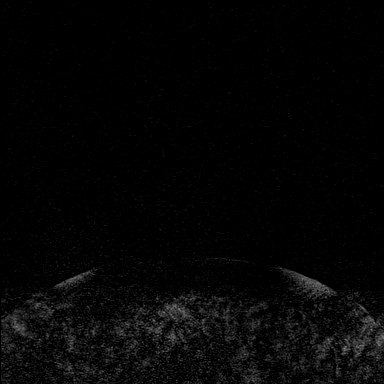
[im 32/160]
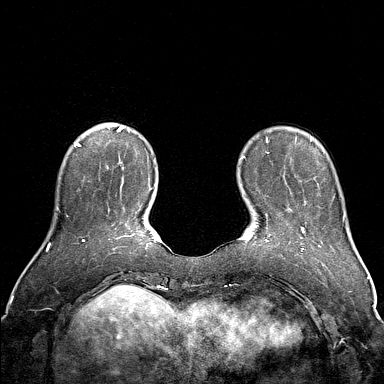
[im 64/160]
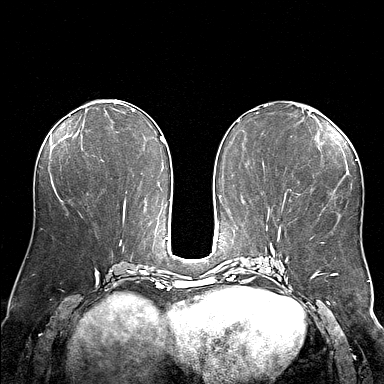
[im 96/160]
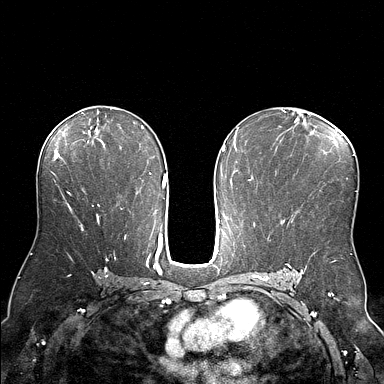
[im 128/160]
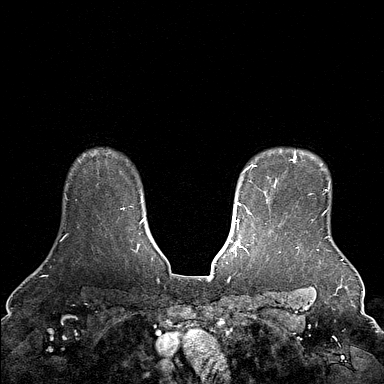
[im 160/160]
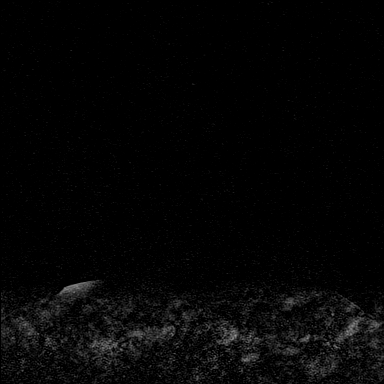

[Series 9: fl3d post-cm 3min_sub · axial · 1.2mm · 0.94mm/px · z∈[-96,+57]mm · 5 of 160 slices shown]
[im 1/160]
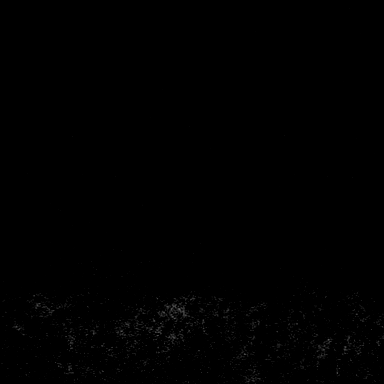
[im 32/160]
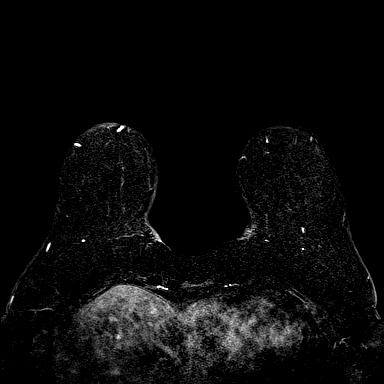
[im 64/160]
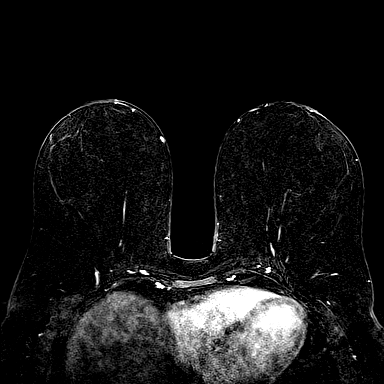
[im 96/160]
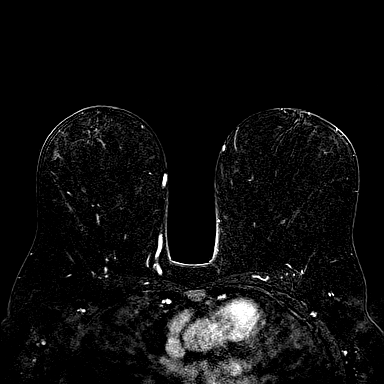
[im 128/160]
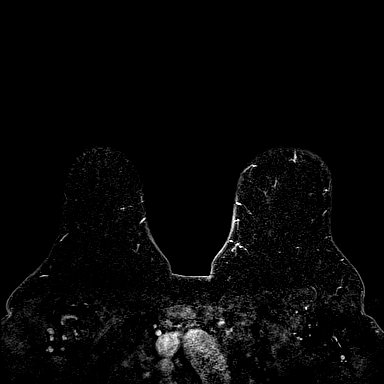

[33 of 48 positions shown; findings below may reference images not displayed]

THREE-DIMENSIONAL MR IMAGE RENDERING ON INDEPENDENT WORKSTATION:

Three-dimensional MR images were rendered by post-processing of the
original MR data on an independent workstation. The
three-dimensional MR images were interpreted, and findings are
reported in the following complete MRI report for this study. Three
dimensional images were evaluated at the independent [employer]
workstation
FINDINGS: Breast composition: b. Scattered fibroglandular tissue.

Background parenchymal enhancement: Minimal

Right breast: No mass or abnormal enhancement.

Left breast: No mass or abnormal enhancement.

Lymph nodes: No abnormal appearing lymph nodes.

Ancillary findings:  None.
IMPRESSION: No MRI evidence of malignancy in the bilateral breasts.

RECOMMENDATION:
1. As per the diagnostic report from June 13, 2017, a bilateral
diagnostic mammogram and right breast ultrasound is recommended in
Monday June, 2018.

2. annual high risk screening MRI is recommended, as per American
Cancer Society guidelines, if the patient has a calculated lifetime
risk of developing breast cancer of greater than 20%, annual
screening MRI of the breasts would be recommended at the time of
screening mammography.

BI-RADS CATEGORY  1: Negative.

## 2019-07-10 ENCOUNTER — Telehealth: Payer: Self-pay | Admitting: *Deleted

## 2019-07-10 NOTE — Telephone Encounter (Signed)
   Primary Cardiologist: Kirk Ruths, MD  Cristopher Peru, MD   Chart reviewed as part of pre-operative protocol coverage. Patient was contacted 07/10/2019 in reference to pre-operative risk assessment for pending surgery as outlined below.  Ebony [name] was last seen on 01/23/2019 by Dr Lovena Le.  Since that day, Ebony [name] has done well, until her hip got bad. DASI score is 6.27 METs.  Therefore, based on ACC/AHA guidelines, the patient would be at acceptable risk for the planned procedure without further cardiovascular testing.   I will route this recommendation to the requesting party via Epic fax function and remove from pre-op pool.  Please call with questions.  Rosaria Ferries, PA-C 07/10/2019, 11:44 AM

## 2019-07-10 NOTE — Telephone Encounter (Signed)
   [facility] Pre-operative Risk Assessment    Request for surgical clearance:  1. What type of surgery is being performed? RIGHT TOTAL HIP ARTHROPLASTY   2. When is this surgery scheduled? 08/25/19   3. What type of clearance is required (medical clearance vs. Pharmacy clearance to hold med vs. Both)? MEDICAL  4. Are there any medications that need to be held prior to surgery and how long? NONE LISTED   5. Practice name and name of physician performing surgery?  [facility]; DR. FRANK ALUISIO  6. What is your office phone number [phone] Joycelyn Schmid    7.   What is your office fax number [phone] ATTN KELLY [place]  8.   Anesthesia type (None, local, MAC, general) ? CHOICE   Julaine Hua 07/10/2019, 8:29 AM  _________________________________________________________________   (provider comments below)

## 2019-07-13 ENCOUNTER — Other Ambulatory Visit: Payer: Self-pay | Admitting: Obstetrics and Gynecology

## 2019-07-13 DIAGNOSIS — Z1509 Genetic susceptibility to other malignant neoplasm: Secondary | ICD-10-CM

## 2019-08-05 ENCOUNTER — Ambulatory Visit
Admission: RE | Admit: 2019-08-05 | Discharge: 2019-08-05 | Disposition: A | Discharge status: Home / Self Care | Payer: 59 | Source: Ambulatory Visit | Attending: Obstetrics and Gynecology | Admitting: Obstetrics and Gynecology

## 2019-08-05 ENCOUNTER — Other Ambulatory Visit: Payer: Self-pay

## 2019-08-05 ENCOUNTER — Other Ambulatory Visit: Payer: Self-pay | Admitting: Obstetrics and Gynecology

## 2019-08-05 DIAGNOSIS — R9389 Abnormal findings on diagnostic imaging of other specified body structures: Secondary | ICD-10-CM

## 2019-08-05 DIAGNOSIS — Z1509 Genetic susceptibility to other malignant neoplasm: Secondary | ICD-10-CM

## 2019-08-05 MED ORDER — GADOBUTROL 1 MMOL/ML IV SOLN
10.0000 mL | Freq: Once | INTRAVENOUS | Status: AC | PRN
  Administered 2019-08-05: 08:00:00 10 mL via INTRAVENOUS

## 2019-08-09 ENCOUNTER — Other Ambulatory Visit: Payer: Self-pay | Admitting: Family Medicine

## 2019-08-13 ENCOUNTER — Telehealth (INDEPENDENT_AMBULATORY_CARE_PROVIDER_SITE_OTHER): Payer: 59 | Admitting: Cardiology

## 2019-08-13 ENCOUNTER — Other Ambulatory Visit: Payer: Self-pay

## 2019-08-13 ENCOUNTER — Encounter: Payer: Self-pay | Admitting: Cardiology

## 2019-08-13 ENCOUNTER — Ambulatory Visit
Admission: RE | Admit: 2019-08-13 | Discharge: 2019-08-13 | Disposition: A | Discharge status: Home / Self Care | Payer: 59 | Source: Ambulatory Visit | Attending: Obstetrics and Gynecology | Admitting: Obstetrics and Gynecology

## 2019-08-13 VITALS — BP 120/86 | HR 72 | Ht 65.0 in | Wt 225.0 lb

## 2019-08-13 DIAGNOSIS — R9389 Abnormal findings on diagnostic imaging of other specified body structures: Secondary | ICD-10-CM

## 2019-08-13 DIAGNOSIS — I10 Essential (primary) hypertension: Secondary | ICD-10-CM | POA: Insufficient documentation

## 2019-08-13 DIAGNOSIS — E6609 Other obesity due to excess calories: Secondary | ICD-10-CM | POA: Diagnosis not present

## 2019-08-13 DIAGNOSIS — Z6837 Body mass index (BMI) 37.0-37.9, adult: Secondary | ICD-10-CM

## 2019-08-13 DIAGNOSIS — I471 Supraventricular tachycardia: Secondary | ICD-10-CM | POA: Diagnosis not present

## 2019-08-13 MED ORDER — GADOBUTROL 1 MMOL/ML IV SOLN
9.0000 mL | Freq: Once | INTRAVENOUS | Status: AC | PRN
  Administered 2019-08-13: 9 mL via INTRAVENOUS

## 2019-08-13 NOTE — Progress Notes (Signed)
Virtual Visit via Video Note   This visit type was conducted due to national recommendations for restrictions regarding the COVID-19 Pandemic (e.g. social distancing) in an effort to limit this patient's exposure and mitigate transmission in our community.  Due to her co-morbid illnesses, this patient is at least at moderate risk for complications without adequate follow up.  This format is felt to be most appropriate for this patient at this time.  All issues noted in this document were discussed and addressed.  A limited physical exam was performed with this format.  Please refer to the patient's chart for her consent to telehealth for [facility].   The patient was identified using 2 identifiers.  Date:  08/13/2019   ID:  Ebony [name], DOB Mar 24, 1966, MRN [id number]  Patient Location: Home Provider Location: Home  PCP:  Mosie Lukes, MD  Cardiologist:  Kirk Ruths, MD  Electrophysiologist:  Cristopher Peru, MD   Evaluation Performed:  Follow-Up Visit  Chief Complaint:  none  History of Present Illness:    Ebony [name] is a 54 y.o. female with history of AVNRT status post radiofrequency ablation in 2020 by Dr. Lovena Le, hypertension with mild LVH and normal LVF by echo, and obesity with a BMI of 37.  The patient was contacted today for routine follow-up.  She has done well since her last office visit.  She has had no further tachycardia episodes.  She does say she notes an occasional irregular beat when she is laying down at night, this sounds like PACs.  Her blood pressure is controlled on her current medications and she has no adverse side effects from her medication.  The patient does not have symptoms concerning for COVID-19 infection (fever, chills, cough, or new shortness of breath).    Past Medical History:  Diagnosis Date  . Arthritis    hips    . BCC (basal cell carcinoma), face    skin   . Concussion with no loss of consciousness 12/28/2014  . Depression  with anxiety 10/06/2012  . Diabetes mellitus without complication ([place])    no meds--- reports as borderline , last a1c 01-05-2019 with PCP 6.3%  . GERD (gastroesophageal reflux disease) 09/05/2015   MGD WITH PPI   . Headache(784.0)    otc meds prn, migraine, NOW 1-2 TIMES ANNUALLY   . Hyperlipidemia, mixed 12/28/2014  . Hypertension    only with preg. 71yrs ago 2002- no meds  . Hypothyroid 06/15/2012  . Obesity 09/05/2015  . Post-operative nausea and vomiting   . SVT (supraventricular tachycardia) ([place])   . Tinea pedis 12/28/2014   Past Surgical History:  Procedure Laterality Date  . ABDOMINAL HYSTERECTOMY  03/04/2012   Procedure: HYSTERECTOMY ABDOMINAL;  Surgeon: Marylynn Pearson, MD;  Location: [facility];  Service: Gynecology;  Laterality: N/A;  with lysis of adhesions  . BASAL CELL CARCINOMA EXCISION Left    cheek/face  . CESAREAN SECTION  2002   HELLP Syndrome/ one time  . COLONOSCOPY     SAME TIME AS ENDO   . DILATION AND CURETTAGE OF UTERUS  2007   Miscarriage  . LAPAROSCOPIC LYSIS OF ADHESIONS N/A 02/17/2019   Procedure: LAPAROSCOPIC LYSIS OF ADHESIONS;  Surgeon: Everitt Amber, MD;  Location: [facility];  Service: Gynecology;  Laterality: N/A;  . LAPAROSCOPIC TUBAL LIGATION  01/03/2011   Procedure: LAPAROSCOPIC TUBAL LIGATION;  Surgeon: Marylynn Pearson;  Location: [facility];  Service: Gynecology;  Laterality: Bilateral;  with filshie clips  . LAPAROSCOPY  03/04/2012  Procedure: LAPAROSCOPY DIAGNOSTIC;  Surgeon: Marylynn Pearson, MD;  Location: [facility];  Service: Gynecology;  Laterality: N/A;  with lysis of adhesions  . ROBOTIC ASSISTED BILATERAL SALPINGO OOPHERECTOMY Bilateral 02/17/2019   Procedure: XI ROBOTIC ASSISTED BILATERAL SALPINGO OOPHORECTOMY;  Surgeon: Everitt Amber, MD;  Location: [facility];  Service: Gynecology;  Laterality: Bilateral;  . SHOULDER ARTHROSCOPY  2012   right  . SVT ABLATION N/A 12/17/2018   Procedure: SVT ABLATION;  Surgeon: [place] Lance, MD;  Location: [facility];   Service: Cardiovascular;  Laterality: N/A;  . TONSILLECTOMY    . UPPER GI ENDOSCOPY       Current Meds  Medication Sig  . Ascorbic Acid (VITAMIN C) 1000 MG tablet Take 1,000 mg by mouth every evening.  . Cholecalciferol (VITAMIN D) 50 MCG (2000 UT) tablet Take 2,000 Units by mouth daily.  Marland Kitchen levothyroxine (SYNTHROID) 75 MCG tablet TAKE 1 TABLET BY MOUTH EVERY DAY BEFORE BREAKFAST  . pantoprazole (PROTONIX) 40 MG tablet Take 1 tablet (40 mg total) by mouth 2 (two) times daily.  Marland Kitchen venlafaxine XR (EFFEXOR-XR) 75 MG 24 hr capsule TAKE ONE CAPSULE BY MOUTH EVERY DAY WITH BREAKFAST     Allergies:   Patient has no known allergies.   Social History   Tobacco Use  . Smoking status: Never Smoker  . Smokeless tobacco: Never Used  Substance Use Topics  . Alcohol use: Yes    Comment: occasionally   . Drug use: No     Family Hx: The patient's family history includes Basal cell carcinoma (age of onset: 76) in her brother; Breast cancer (age of onset: 36) in her cousin; Breast cancer (age of onset: 27) in her mother; Depression in her mother; Emphysema in her maternal grandfather; Hypertension in her mother; Lung cancer in her mother; Other in her maternal grandmother; Prostate cancer in her maternal grandfather. There is no history of Colon cancer or Esophageal cancer.  ROS:   Please see the history of present illness.    All other systems reviewed and are negative.   Prior CV studies:   The following studies were reviewed today: Echo Dec 2019  Labs/Other Tests and Data Reviewed:    EKG:  An ECG dated 01/23/2019 was personally reviewed today and demonstrated:  NSR, HR 69  Recent Labs: 01/05/2019: TSH 1.94 06/02/2019: ALT 25; BUN 13; Creatinine, Ser 0.85; Hemoglobin 15.0; Platelets 378; Potassium 3.7; Sodium 135   Recent Lipid Panel Lab Results  Component Value Date/Time   CHOL 208 (H) 01/05/2019 02:17 PM   TRIG 164.0 (H) 01/05/2019 02:17 PM   HDL 55.30 01/05/2019 02:17 PM   CHOLHDL  4 01/05/2019 02:17 PM   LDLCALC 119 (H) 01/05/2019 02:17 PM    Wt Readings from Last 3 Encounters:  08/13/19 225 lb (102.1 kg)  06/10/19 237 lb 2 oz (107.6 kg)  06/02/19 225 lb (102.1 kg)     Objective:    Vital Signs:  BP 120/86   Pulse 72   Ht 5\' 5"  (1.651 m)   Wt 225 lb (102.1 kg)   LMP 12/15/2010   BMI 37.44 kg/m    VITAL SIGNS:  reviewed  ASSESSMENT & PLAN:    AVNRT- S/P RFA 12/17/2018- no recurrence  HTN- Controlled  Obesity-  BMI 37- she has lost weight since her LOV    COVID-19 Education: The signs and symptoms of COVID-19 were discussed with the patient and how to seek care for testing (follow up with PCP or arrange E-visit).  The importance of social distancing was discussed today.  Time:   Today, I have spent 10 minutes with the patient with telehealth technology discussing the above problems.     Medication Adjustments/Labs and Tests Ordered: Current medicines are reviewed at length with the patient today.  Concerns regarding medicines are outlined above.   Tests Ordered: No orders of the defined types were placed in this encounter.   Medication Changes: No orders of the defined types were placed in this encounter.   Follow Up:  Either In Person or Virtual one year with dr Stanford Breed  Signed, Kerin Ransom, PA-C  08/13/2019 11:50 AM    [place]

## 2019-09-03 LAB — HM DIABETES EYE EXAM

## 2019-09-15 ENCOUNTER — Encounter: Payer: Self-pay | Admitting: *Deleted

## 2020-01-05 ENCOUNTER — Encounter: Payer: Self-pay | Admitting: Family Medicine

## 2020-01-06 ENCOUNTER — Other Ambulatory Visit: Payer: Self-pay | Admitting: Family Medicine

## 2020-01-06 DIAGNOSIS — E119 Type 2 diabetes mellitus without complications: Secondary | ICD-10-CM

## 2020-01-06 DIAGNOSIS — E039 Hypothyroidism, unspecified: Secondary | ICD-10-CM

## 2020-01-06 DIAGNOSIS — E782 Mixed hyperlipidemia: Secondary | ICD-10-CM

## 2020-01-06 DIAGNOSIS — R002 Palpitations: Secondary | ICD-10-CM

## 2020-01-06 DIAGNOSIS — D72829 Elevated white blood cell count, unspecified: Secondary | ICD-10-CM

## 2020-01-07 ENCOUNTER — Other Ambulatory Visit: Payer: 59

## 2020-01-11 ENCOUNTER — Encounter: Payer: 59 | Admitting: Family Medicine

## 2020-01-14 ENCOUNTER — Other Ambulatory Visit (INDEPENDENT_AMBULATORY_CARE_PROVIDER_SITE_OTHER): Payer: 59

## 2020-01-14 ENCOUNTER — Other Ambulatory Visit: Payer: Self-pay

## 2020-01-14 DIAGNOSIS — E782 Mixed hyperlipidemia: Secondary | ICD-10-CM

## 2020-01-14 DIAGNOSIS — E039 Hypothyroidism, unspecified: Secondary | ICD-10-CM

## 2020-01-14 DIAGNOSIS — D72829 Elevated white blood cell count, unspecified: Secondary | ICD-10-CM

## 2020-01-14 DIAGNOSIS — E119 Type 2 diabetes mellitus without complications: Secondary | ICD-10-CM

## 2020-01-15 LAB — LIPID PANEL
Cholesterol: 176 mg/dL (ref 0–200)
HDL: 48.2 mg/dL (ref >39.00)
LDL Cholesterol: 100 mg/dL — ABNORMAL HIGH (ref 0–99)
NonHDL: 127.76
Total CHOL/HDL Ratio: 4
Triglycerides: 139 mg/dL (ref 0.0–149.0)
VLDL: 27.8 mg/dL (ref 0.0–40.0)

## 2020-01-15 LAB — CBC WITH DIFFERENTIAL/PLATELET
Basophils Absolute: 0.1 10*3/uL (ref 0.0–0.1)
Basophils Relative: 0.8 % (ref 0.0–3.0)
Eosinophils Absolute: 0.2 10*3/uL (ref 0.0–0.7)
Eosinophils Relative: 2.5 % (ref 0.0–5.0)
HCT: 37.6 % (ref 36.0–46.0)
Hemoglobin: 12.2 g/dL (ref 12.0–15.0)
Lymphocytes Relative: 30.9 % (ref 12.0–46.0)
Lymphs Abs: 3 10*3/uL (ref 0.7–4.0)
MCHC: 32.4 g/dL (ref 30.0–36.0)
MCV: 77.7 fl — ABNORMAL LOW (ref 78.0–100.0)
Monocytes Absolute: 0.6 10*3/uL (ref 0.1–1.0)
Monocytes Relative: 6 % (ref 3.0–12.0)
Neutro Abs: 5.9 10*3/uL (ref 1.4–7.7)
Neutrophils Relative %: 59.8 % (ref 43.0–77.0)
Platelets: 358 10*3/uL (ref 150.0–400.0)
RBC: 4.84 Mil/uL (ref 3.87–5.11)
RDW: 16.1 % — ABNORMAL HIGH (ref 11.5–15.5)
WBC: 9.8 10*3/uL (ref 4.0–10.5)

## 2020-01-15 LAB — COMPREHENSIVE METABOLIC PANEL
ALT: 13 U/L (ref 0–35)
AST: 18 U/L (ref 0–37)
Albumin: 4.1 g/dL (ref 3.5–5.2)
Alkaline Phosphatase: 82 U/L (ref 39–117)
BUN: 16 mg/dL (ref 6–23)
CO2: 29 mEq/L (ref 19–32)
Calcium: 9.7 mg/dL (ref 8.4–10.5)
Chloride: 103 mEq/L (ref 96–112)
Creatinine, Ser: 0.91 mg/dL (ref 0.40–1.20)
GFR: 64.93 mL/min (ref >60.00)
Glucose, Bld: 94 mg/dL (ref 70–99)
Potassium: 4.1 mEq/L (ref 3.5–5.1)
Sodium: 140 mEq/L (ref 135–145)
Total Bilirubin: 0.3 mg/dL (ref 0.2–1.2)
Total Protein: 7.2 g/dL (ref 6.0–8.3)

## 2020-01-15 LAB — HEMOGLOBIN A1C: Hgb A1c MFr Bld: 6.7 % — ABNORMAL HIGH (ref 4.6–6.5)

## 2020-01-15 LAB — TSH: TSH: 1.59 u[IU]/mL (ref 0.35–4.50)

## 2020-01-20 ENCOUNTER — Ambulatory Visit (INDEPENDENT_AMBULATORY_CARE_PROVIDER_SITE_OTHER): Payer: 59 | Admitting: Medical

## 2020-01-20 ENCOUNTER — Encounter: Payer: Self-pay | Admitting: Medical

## 2020-01-20 ENCOUNTER — Other Ambulatory Visit: Payer: Self-pay

## 2020-01-20 VITALS — BP 113/74 | HR 80 | Temp 98.4°F | Resp 18 | Ht 65.0 in | Wt 220.4 lb

## 2020-01-20 DIAGNOSIS — Z0000 Encounter for general adult medical examination without abnormal findings: Secondary | ICD-10-CM | POA: Diagnosis not present

## 2020-01-20 NOTE — Progress Notes (Signed)
Subjective:    Patient ID: Ebony [name], female    DOB: [birth year], 54 y.o.   MRN: [id number]  HPI  Pt in for cpe. She is fasting.  Pt works at [employer]. Pt not exercising a lot since her hip replacement. Pt is still doing PT so she can exercise. Pt states moderate healthy diet. Non smoker, occasional social alcohol use. Not much caffeine.  Up to date on colonoscopy. Pap smear in 2020. Mammogram done in 202.     Pt has gotten j and j vaccine.  Labs already done and pcp reviewed/notified pt.    Review of Systems  Constitutional: Negative for chills, fatigue and fever.  HENT: Negative for congestion.   Respiratory: Negative for cough, chest tightness, shortness of breath and wheezing.   Cardiovascular: Negative for chest pain and palpitations.  Gastrointestinal: Negative for abdominal pain.  Musculoskeletal: Negative for back pain.  Skin: Negative for rash.  Neurological: Negative for dizziness, weakness and headaches.  Hematological: Negative for adenopathy. Does not bruise/bleed easily.  Psychiatric/Behavioral: Negative for behavioral problems. The patient is not nervous/anxious.    Past Medical History:  Diagnosis Date  . Arthritis    hips    . BCC (basal cell carcinoma), face    skin   . Concussion with no loss of consciousness 12/28/2014  . Depression with anxiety 10/06/2012  . Diabetes mellitus without complication ([place])    no meds--- reports as borderline , last a1c 01-05-2019 with PCP 6.3%  . GERD (gastroesophageal reflux disease) 09/05/2015   MGD WITH PPI   . Headache(784.0)    otc meds prn, migraine, NOW 1-2 TIMES ANNUALLY   . Hyperlipidemia, mixed 12/28/2014  . Hypertension    only with preg. 67yrs ago 2002- no meds  . Hypothyroid 06/15/2012  . Obesity 09/05/2015  . Post-operative nausea and vomiting   . SVT (supraventricular tachycardia) ([place])   . Tinea pedis 12/28/2014     Social History   Socioeconomic History  . Marital status: Married    Spouse  name: Not on file  . Number of children: 1  . Years of education: Not on file  . 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 Smoking status: Never Smoker  . Smokeless tobacco: Never Used  Vaping Use  . Vaping Use: Never used  Substance and Sexual Activity  . Alcohol use: Yes    Comment: occasionally   . Drug use: No  . Sexual activity: Yes    Birth control/protection: Surgical  Other Topics Concern  . Not on file  Social History Narrative  . Not on file   Social Determinants of Health   Financial Resource Strain:   . Difficulty of Paying Living Expenses:   Food Insecurity:   . Worried About Charity fundraiser in the Last Year:   . Arboriculturist in the Last Year:   Transportation Needs:   . Film/video editor (Medical):   Marland Kitchen Lack of Transportation (Non-Medical):   Physical Activity:   . Days of Exercise per Week:   . Minutes of Exercise per Session:   Stress:   . Feeling of Stress :   Social Connections:   . Frequency of Communication with Friends and Family:   . Frequency of Social Gatherings with Friends and Family:   . Attends Religious Services:   . Active Member of Clubs or Organizations:   . Attends Archivist  Meetings:   Marland Kitchen Marital Status:   Intimate Partner Violence:   . Fear of Current or Ex-Partner:   . Emotionally Abused:   Marland Kitchen Physically Abused:   . Sexually Abused:     Past Surgical History:  Procedure Laterality Date  . ABDOMINAL HYSTERECTOMY  03/04/2012   Procedure: HYSTERECTOMY ABDOMINAL;  Surgeon: Marylynn Pearson, MD;  Location: [facility];  Service: Gynecology;  Laterality: N/A;  with lysis of adhesions  . BASAL CELL CARCINOMA EXCISION Left    cheek/face  . CESAREAN SECTION  2002   HELLP Syndrome/ one time  . COLONOSCOPY     SAME TIME AS ENDO   . DILATION AND CURETTAGE OF UTERUS  2007   Miscarriage  . LAPAROSCOPIC LYSIS OF ADHESIONS N/A 02/17/2019   Procedure: LAPAROSCOPIC  LYSIS OF ADHESIONS;  Surgeon: Everitt Amber, MD;  Location: [facility];  Service: Gynecology;  Laterality: N/A;  . LAPAROSCOPIC TUBAL LIGATION  01/03/2011   Procedure: LAPAROSCOPIC TUBAL LIGATION;  Surgeon: Marylynn Pearson;  Location: [facility];  Service: Gynecology;  Laterality: Bilateral;  with filshie clips  . LAPAROSCOPY  03/04/2012   Procedure: LAPAROSCOPY DIAGNOSTIC;  Surgeon: Marylynn Pearson, MD;  Location: [facility];  Service: Gynecology;  Laterality: N/A;  with lysis of adhesions  . ROBOTIC ASSISTED BILATERAL SALPINGO OOPHERECTOMY Bilateral 02/17/2019   Procedure: XI ROBOTIC ASSISTED BILATERAL SALPINGO OOPHORECTOMY;  Surgeon: Everitt Amber, MD;  Location: [facility];  Service: Gynecology;  Laterality: Bilateral;  . SHOULDER ARTHROSCOPY  2012   right  . SVT ABLATION N/A 12/17/2018   Procedure: SVT ABLATION;  Surgeon: [place] Lance, MD;  Location: [facility];  Service: Cardiovascular;  Laterality: N/A;  . TONSILLECTOMY    . UPPER GI ENDOSCOPY      Family History  Problem Relation Age of Onset  . Hypertension Mother   . Depression Mother   . Breast cancer Mother 53  . Lung cancer Mother        lung (06-13-11)  . Basal cell carcinoma Brother 40  . Other Maternal Grandmother        spinal stenosis  . Emphysema Maternal Grandfather   . Prostate cancer Maternal Grandfather   . Breast cancer Cousin 78  . Colon cancer Neg Hx   . Esophageal cancer Neg Hx     No Known Allergies  Current Outpatient Medications on File Prior to Visit  Medication Sig Dispense Refill  . amLODipine (NORVASC) 5 MG tablet Take 1 tablet (5 mg total) by mouth daily. 30 tablet 11  . Ascorbic Acid (VITAMIN C) 1000 MG tablet Take 1,000 mg by mouth every evening.    . Cholecalciferol (VITAMIN D) 50 MCG (2000 UT) tablet Take 2,000 Units by mouth daily.    Marland Kitchen levothyroxine (SYNTHROID) 75 MCG tablet TAKE 1 TABLET BY MOUTH EVERY DAY BEFORE BREAKFAST 90 tablet 3  . pantoprazole (PROTONIX) 40 MG tablet Take 1 tablet (40 mg total)  by mouth 2 (two) times daily. 180 tablet 5  . venlafaxine XR (EFFEXOR-XR) 75 MG 24 hr capsule TAKE ONE CAPSULE BY MOUTH EVERY DAY WITH BREAKFAST 90 capsule 1   No current facility-administered medications on file prior to visit.    BP 113/74   Pulse 80   Temp 98.4 F (36.9 C) (Oral)   Resp 18   Ht 5\' 5"  (1.651 m)   Wt 220 lb 6.4 oz (100 kg)   LMP 12/15/2010   SpO2 98%   BMI 36.68 kg/m       Objective:  Physical Exam  General Mental Status- Alert. General Appearance- Not in acute distress.   Skin General: Color- Normal Color. Moisture- Normal Moisture.  Neck Carotid Arteries- Normal color. Moisture- Normal Moisture. No carotid bruits. No JVD.  Chest and Lung Exam Auscultation: Breath Sounds:-Normal.  Cardiovascular Auscultation:Rythm- Regular. Murmurs & Other Heart Sounds:Auscultation of the heart reveals- No Murmurs.  Abdomen Inspection:-Inspeection Normal. Palpation/Percussion:Note:No mass. Palpation and Percussion of the abdomen reveal- Non Tender, Non Distended + BS, no rebound or guarding.   Neurologic Cranial Nerve exam:- CN III-XII intact(No nystagmus), symmetric smile. Strength:- 5/5 equal and symmetric strength both upper and lower extremities.  Derm- no worrisome moles or lesions. Checked annually by derm. Every January.     Assessment & Plan:  Normal cpe today. Follow advise of pcp on lab work.  Recommend flu vaccine in October but sooner if cdc recommend sooner or if early flu season. Consider getting booster covid.(cdc studying mixing covid vaccines)  Recommend exercise and healthy diet.   Follow up 1 yr or as needed.   Mackie Pai, PA-C

## 2020-01-20 NOTE — Patient Instructions (Addendum)
Normal cpe today. Follow advise of pcp on lab work.  Recommend flu vaccine in October but sooner if cdc recommend sooner or if early flu season. Consider getting booster covid.(cdc studying mixing covid vaccines)  Recommend exercise and healthy diet.   Follow up 1 yr or as needed.      Preventive Care 18-52 Years Old, Female Preventive care refers to visits with your health care provider and lifestyle choices that can promote health and wellness. This includes:  A yearly physical exam. This may also be called an annual well check.  Regular dental visits and eye exams.  Immunizations.  Screening for certain conditions.  Healthy lifestyle choices, such as eating a healthy diet, getting regular exercise, not using drugs or products that contain nicotine and tobacco, and limiting alcohol use. What can I expect for my preventive care visit? Physical exam Your health care provider will check your:  Height and weight. This may be used to calculate body mass index (BMI), which tells if you are at a healthy weight.  Heart rate and blood pressure.  Skin for abnormal spots. Counseling Your health care provider may ask you questions about your:  Alcohol, tobacco, and drug use.  Emotional well-being.  Home and relationship well-being.  Sexual activity.  Eating habits.  Work and work Statistician.  Method of birth control.  Menstrual cycle.  Pregnancy history. What immunizations do I need?  Influenza (flu) vaccine  This is recommended every year. Tetanus, diphtheria, and pertussis (Tdap) vaccine  You may need a Td booster every 10 years. Varicella (chickenpox) vaccine  You may need this if you have not been vaccinated. Zoster (shingles) vaccine  You may need this after age 35. Measles, mumps, and rubella (MMR) vaccine  You may need at least one dose of MMR if you were born in 1957 or later. You may also need a second dose. Pneumococcal conjugate (PCV13)  vaccine  You may need this if you have certain conditions and were not previously vaccinated. Pneumococcal polysaccharide (PPSV23) vaccine  You may need one or two doses if you smoke cigarettes or if you have certain conditions. Meningococcal conjugate (MenACWY) vaccine  You may need this if you have certain conditions. Hepatitis A vaccine  You may need this if you have certain conditions or if you travel or work in places where you may be exposed to hepatitis A. Hepatitis B vaccine  You may need this if you have certain conditions or if you travel or work in places where you may be exposed to hepatitis B. Haemophilus influenzae type b (Hib) vaccine  You may need this if you have certain conditions. Human papillomavirus (HPV) vaccine  If recommended by your health care provider, you may need three doses over 6 months. You may receive vaccines as individual doses or as more than one vaccine together in one shot (combination vaccines). Talk with your health care provider about the risks and benefits of combination vaccines. What tests do I need? Blood tests  Lipid and cholesterol levels. These may be checked every 5 years, or more frequently if you are over 29 years old.  Hepatitis C test.  Hepatitis B test. Screening  Lung cancer screening. You may have this screening every year starting at age 54 if you have a 30-pack-year history of smoking and currently smoke or have quit within the past 15 years.  Colorectal cancer screening. All adults should have this screening starting at age 45 and continuing until age 54. Your health care  provider may recommend screening at age 37 if you are at increased risk. You will have tests every 1-10 years, depending on your results and the type of screening test.  Diabetes screening. This is done by checking your blood sugar (glucose) after you have not eaten for a while (fasting). You may have this done every 1-3 years.  Mammogram. This may be  done every 1-2 years. Talk with your health care provider about when you should start having regular mammograms. This may depend on whether you have a family history of breast cancer.  BRCA-related cancer screening. This may be done if you have a family history of breast, ovarian, tubal, or peritoneal cancers.  Pelvic exam and Pap test. This may be done every 3 years starting at age 7. Starting at age 29, this may be done every 5 years if you have a Pap test in combination with an HPV test. Other tests  Sexually transmitted disease (STD) testing.  Bone density scan. This is done to screen for osteoporosis. You may have this scan if you are at high risk for osteoporosis. Follow these instructions at home: Eating and drinking  Eat a diet that includes fresh fruits and vegetables, whole grains, lean protein, and low-fat dairy.  Take vitamin and mineral supplements as recommended by your health care provider.  Do not drink alcohol if: ? Your health care provider tells you not to drink. ? You are pregnant, may be pregnant, or are planning to become pregnant.  If you drink alcohol: ? Limit how much you have to 0-1 drink a day. ? Be aware of how much alcohol is in your drink. In the [place] one drink equals one 12 oz bottle of beer (355 mL), one 5 oz glass of wine (148 mL), or one 1 oz glass of hard liquor (44 mL). Lifestyle  Take daily care of your teeth and gums.  Stay active. Exercise for at least 30 minutes on 5 or more days each week.  Do not use any products that contain nicotine or tobacco, such as cigarettes, e-cigarettes, and chewing tobacco. If you need help quitting, ask your health care provider.  If you are sexually active, practice safe sex. Use a condom or other form of birth control (contraception) in order to prevent pregnancy and STIs (sexually transmitted infections).  If told by your health care provider, take low-dose aspirin daily starting at age 51. What's  next?  Visit your health care provider once a year for a well check visit.  Ask your health care provider how often you should have your eyes and teeth checked.  Stay up to date on all vaccines. This information is not intended to replace advice given to you by your health care provider. Make sure you discuss any questions you have with your health care provider. Document Revised: 01/30/2018 Document Reviewed: 01/30/2018 Elsevier Patient Education  2020 [employer].

## 2020-02-05 ENCOUNTER — Telehealth: Payer: Self-pay

## 2020-02-05 NOTE — Telephone Encounter (Signed)
[employer] Form completed and pt. Notified that it is ready for pick-up.

## 2020-02-11 ENCOUNTER — Other Ambulatory Visit: Payer: Self-pay | Admitting: Family Medicine

## 2020-02-18 ENCOUNTER — Other Ambulatory Visit: Payer: Self-pay

## 2020-02-18 ENCOUNTER — Emergency Department (HOSPITAL_BASED_OUTPATIENT_CLINIC_OR_DEPARTMENT_OTHER): Payer: 59

## 2020-02-18 ENCOUNTER — Encounter (HOSPITAL_BASED_OUTPATIENT_CLINIC_OR_DEPARTMENT_OTHER): Payer: Self-pay | Admitting: *Deleted

## 2020-02-18 ENCOUNTER — Emergency Department (HOSPITAL_BASED_OUTPATIENT_CLINIC_OR_DEPARTMENT_OTHER)
Admission: EM | Admit: 2020-02-18 | Discharge: 2020-02-18 | Disposition: A | Discharge status: Home / Self Care | Payer: 59 | Attending: Emergency Medicine | Admitting: Emergency Medicine

## 2020-02-18 DIAGNOSIS — I10 Essential (primary) hypertension: Secondary | ICD-10-CM | POA: Insufficient documentation

## 2020-02-18 DIAGNOSIS — E039 Hypothyroidism, unspecified: Secondary | ICD-10-CM | POA: Insufficient documentation

## 2020-02-18 DIAGNOSIS — E119 Type 2 diabetes mellitus without complications: Secondary | ICD-10-CM | POA: Insufficient documentation

## 2020-02-18 DIAGNOSIS — Z79899 Other long term (current) drug therapy: Secondary | ICD-10-CM | POA: Diagnosis not present

## 2020-02-18 DIAGNOSIS — K529 Noninfective gastroenteritis and colitis, unspecified: Secondary | ICD-10-CM | POA: Insufficient documentation

## 2020-02-18 DIAGNOSIS — Z79890 Hormone replacement therapy (postmenopausal): Secondary | ICD-10-CM | POA: Diagnosis not present

## 2020-02-18 DIAGNOSIS — Z8543 Personal history of malignant neoplasm of ovary: Secondary | ICD-10-CM | POA: Insufficient documentation

## 2020-02-18 DIAGNOSIS — R109 Unspecified abdominal pain: Secondary | ICD-10-CM | POA: Diagnosis present

## 2020-02-18 LAB — CBC
HCT: 42.2 % (ref 36.0–46.0)
Hemoglobin: 13.7 g/dL (ref 12.0–15.0)
MCH: 25.4 pg — ABNORMAL LOW (ref 26.0–34.0)
MCHC: 32.5 g/dL (ref 30.0–36.0)
MCV: 78.3 fL — ABNORMAL LOW (ref 80.0–100.0)
Platelets: 409 10*3/uL — ABNORMAL HIGH (ref 150–400)
RBC: 5.39 MIL/uL — ABNORMAL HIGH (ref 3.87–5.11)
RDW: 15.1 % (ref 11.5–15.5)
WBC: 10.3 10*3/uL (ref 4.0–10.5)
nRBC: 0 % (ref 0.0–0.2)

## 2020-02-18 LAB — URINALYSIS, ROUTINE W REFLEX MICROSCOPIC
Bilirubin Urine: NEGATIVE
Glucose, UA: NEGATIVE mg/dL
Ketones, ur: NEGATIVE mg/dL
Leukocytes,Ua: NEGATIVE
Nitrite: NEGATIVE
Protein, ur: NEGATIVE mg/dL
Specific Gravity, Urine: 1.02 (ref 1.005–1.030)
pH: 6 (ref 5.0–8.0)

## 2020-02-18 LAB — COMPREHENSIVE METABOLIC PANEL
ALT: 20 U/L (ref 0–44)
AST: 23 U/L (ref 15–41)
Albumin: 4.1 g/dL (ref 3.5–5.0)
Alkaline Phosphatase: 74 U/L (ref 38–126)
Anion gap: 11 (ref 5–15)
BUN: 13 mg/dL (ref 6–20)
CO2: 22 mmol/L (ref 22–32)
Calcium: 9.5 mg/dL (ref 8.9–10.3)
Chloride: 102 mmol/L (ref 98–111)
Creatinine, Ser: 0.79 mg/dL (ref 0.44–1.00)
GFR calc Af Amer: >60 mL/min (ref >60)
GFR calc non Af Amer: >60 mL/min (ref >60)
Glucose, Bld: 153 mg/dL — ABNORMAL HIGH (ref 70–99)
Potassium: 3.7 mmol/L (ref 3.5–5.1)
Sodium: 135 mmol/L (ref 135–145)
Total Bilirubin: 0.6 mg/dL (ref 0.3–1.2)
Total Protein: 8.3 g/dL — ABNORMAL HIGH (ref 6.5–8.1)

## 2020-02-18 LAB — URINALYSIS, MICROSCOPIC (REFLEX)

## 2020-02-18 LAB — LIPASE, BLOOD: Lipase: 36 U/L (ref 11–51)

## 2020-02-18 MED ORDER — LACTATED RINGERS IV BOLUS
1000.0000 mL | Freq: Once | INTRAVENOUS | Status: AC
  Administered 2020-02-18: 1000 mL via INTRAVENOUS

## 2020-02-18 MED ORDER — IOHEXOL 300 MG/ML  SOLN
100.0000 mL | Freq: Once | INTRAMUSCULAR | Status: AC | PRN
  Administered 2020-02-18: 100 mL via INTRAVENOUS

## 2020-02-18 MED ORDER — CIPROFLOXACIN HCL 500 MG PO TABS: 500.0000 mg | ORAL_TABLET | Freq: Two times a day (BID) | ORAL | 0 refills | Status: AC

## 2020-02-18 MED FILL — CIPROFLOXACIN HCL 500 MG TA: 500 | 10 days supply | Qty: 20 | Fill #0

## 2020-02-18 NOTE — ED Provider Notes (Signed)
[facility] EMERGENCY DEPARTMENT Provider Note   CSN: [id number] Arrival date & time: 02/18/20  1306     History Chief Complaint  Patient presents with  . Abdominal Pain  . Diarrhea    Ebony [name] is a 54 y.o. female.    Ebony [name] is a 53 yr old female with PMH of diabetes, depression, hyperlipidemia, hypertension, hypothyroidism, SVT, tubular adenoma presents today with 6-day history of diarrhea.  Prior to this patient was in her usual state of health her symptoms were started when she had eaten sushi last Thursday night (fish, shrimp and avocado).  She has experienced 6-7 episodes of diarrhea every day since then.  Her diarrhea looks like vomit and contains food contents.   She has associated lower abdominal cramps that started 3 days ago.  2/10 severity.  Constant and feels more " uncomfortable" but than a pain which is worse on breathing in.  Denies unintentional weight loss, fevers, nausea, vomiting rectal bleeding, melena, pain on defecation, dizziness, chest pain, dyspnea, cough, headache or vision changes.  She took Imodium throughout the last week. The diarrhea has now slowed down naturally and today she has gone 3 times.  Patient has been able to eat and drink, however does experience a " churning sensation" in her stomach after eating and experiences diarrhea 1 hour later.  Of note, pt had a similar admission 8 months ago where she was admitted to the ED and discharged with ciprofloxacin for infectious diarrhea. Patient is known to GI following her over 61s colonoscopy and for GERD her last colonoscopy in 2020 showed tubular adenomas.  Patient works for [employer] part time. Drinks EtOH socially. Denies smoking/ illicit drugs.  Lives with husband at home who is in good health.           Past Medical History:  Diagnosis Date  . Arthritis    hips    . BCC (basal cell carcinoma), face    skin   . Concussion with no loss of consciousness 12/28/2014  .  Depression with anxiety 10/06/2012  . Diabetes mellitus without complication ([place])    no meds--- reports as borderline , last a1c 01-05-2019 with PCP 6.3%  . GERD (gastroesophageal reflux disease) 09/05/2015   MGD WITH PPI   . Headache(784.0)    otc meds prn, migraine, NOW 1-2 TIMES ANNUALLY   . Hyperlipidemia, mixed 12/28/2014  . Hypertension    only with preg. 54yr ago 2002- no meds  . Hypothyroid 06/15/2012  . Obesity 09/05/2015  . Post-operative nausea and vomiting   . SVT (supraventricular tachycardia) ([facility]   . Tinea pedis 12/28/2014    Patient Active Problem List   Diagnosis Date Noted  . Essential hypertension 08/13/2019  . Monoallelic mutation of BRIP1 gene 03/30/2018  . Ovarian cancer genetic susceptibility 10/28/2017  . BCC (basal cell carcinoma), face 08/30/2017  . Obesity 09/05/2015  . GERD (gastroesophageal reflux disease) 09/05/2015  . Type 2 diabetes mellitus without complication, without long-term current use of insulin ([facility] 06/20/2015  . Hyperlipidemia, mixed 12/28/2014  . Pain of left calf 10/01/2014  . Hip pain, acute 05/27/2013  . SVT (supraventricular tachycardia) ([facility] 05/06/2013  . Palpitations 01/11/2013  . Right shoulder pain 01/08/2013  . Neck pain 01/08/2013  . Depression with anxiety 10/06/2012  . Hypothyroid 06/15/2012  . Preventative health care 06/13/2012  . Headache     Past Surgical History:  Procedure Laterality Date  . ABDOMINAL HYSTERECTOMY  03/04/2012   Procedure: HYSTERECTOMY ABDOMINAL;  Surgeon: Marylynn Pearson, MD;  Location: [facility];  Service: Gynecology;  Laterality: N/A;  with lysis of adhesions  . BASAL CELL CARCINOMA EXCISION Left    cheek/face  . CESAREAN SECTION  2002   HELLP Syndrome/ one time  . COLONOSCOPY     SAME TIME AS ENDO   . DILATION AND CURETTAGE OF UTERUS  2007   Miscarriage  . LAPAROSCOPIC LYSIS OF ADHESIONS N/A 02/17/2019   Procedure: LAPAROSCOPIC LYSIS OF ADHESIONS;  Surgeon: Everitt Amber, MD;  Location: [facility];   Service: Gynecology;  Laterality: N/A;  . LAPAROSCOPIC TUBAL LIGATION  01/03/2011   Procedure: LAPAROSCOPIC TUBAL LIGATION;  Surgeon: Marylynn Pearson;  Location: [facility];  Service: Gynecology;  Laterality: Bilateral;  with filshie clips  . LAPAROSCOPY  03/04/2012   Procedure: LAPAROSCOPY DIAGNOSTIC;  Surgeon: Marylynn Pearson, MD;  Location: [facility];  Service: Gynecology;  Laterality: N/A;  with lysis of adhesions  . ROBOTIC ASSISTED BILATERAL SALPINGO OOPHERECTOMY Bilateral 02/17/2019   Procedure: XI ROBOTIC ASSISTED BILATERAL SALPINGO OOPHORECTOMY;  Surgeon: Everitt Amber, MD;  Location: [facility];  Service: Gynecology;  Laterality: Bilateral;  . SHOULDER ARTHROSCOPY  2012   right  . SVT ABLATION N/A 12/17/2018   Procedure: SVT ABLATION;  Surgeon: Evans Lance, MD;  Location: [facility];  Service: Cardiovascular;  Laterality: N/A;  . TONSILLECTOMY    . UPPER GI ENDOSCOPY       OB History   No obstetric history on file.     Family History  Problem Relation Age of Onset  . Hypertension Mother   . Depression Mother   . Breast cancer Mother 69  . Lung cancer Mother        lung (06-13-11)  . Basal cell carcinoma Brother 40  . Other Maternal Grandmother        spinal stenosis  . Emphysema Maternal Grandfather   . Prostate cancer Maternal Grandfather   . Breast cancer Cousin 16  . Colon cancer Neg Hx   . Esophageal cancer Neg Hx     Social History   Tobacco Use  . Smoking status: Never Smoker  . Smokeless tobacco: Never Used  Vaping Use  . Vaping Use: Never used  Substance Use Topics  . Alcohol use: Yes    Comment: occasionally   . Drug use: No    Home Medications Prior to Admission medications   Medication Sig Start Date End Date Taking? Authorizing Provider  amLODipine (NORVASC) 5 MG tablet Take 1 tablet (5 mg total) by mouth daily. 04/13/19 02/18/20 Yes Lelon Perla, MD  Ascorbic Acid (VITAMIN C) 1000 MG tablet Take 1,000 mg by mouth every evening.   Yes [provider]  Cholecalciferol (VITAMIN D) 50 MCG (2000 UT) tablet Take 2,000 Units by mouth daily.   Yes [provider]  levothyroxine (SYNTHROID) 75 MCG tablet TAKE 1 TABLET BY MOUTH EVERY DAY BEFORE BREAKFAST 06/02/19  Yes Mosie Lukes, MD  pantoprazole (PROTONIX) 40 MG tablet Take 1 tablet (40 mg total) by mouth 2 (two) times daily. 02/25/19  Yes Cirigliano, Vito V, DO  venlafaxine XR (EFFEXOR-XR) 75 MG 24 hr capsule TAKE ONE CAPSULE BY MOUTH EVERY DAY WITH BREAKFAST 02/11/20  Yes Mosie Lukes, MD    Allergies    Patient has no known allergies.  Review of Systems   Review of Systems  Constitutional: Negative for activity change, appetite change and fever.  HENT: Negative for congestion and rhinorrhea.   Respiratory: Negative for  cough, shortness of breath and wheezing.   Cardiovascular: Negative for chest pain and palpitations.  Gastrointestinal: Positive for abdominal pain and diarrhea. Negative for blood in stool, constipation, nausea and rectal pain.  Genitourinary: Negative for dysuria, frequency and hematuria.  Neurological: Negative for dizziness and light-headedness.  Hematological: Negative for adenopathy.    Physical Exam Updated Vital Signs BP 123/74   Pulse (!) 104   Temp 98.3 F (36.8 C) (Oral)   Resp 18   Ht _0  (1.651 m)   Wt 99.8 kg   LMP 12/15/2010   SpO2 96%   BMI 36.61 kg/m   Physical Exam Constitutional:      General: She is not in acute distress.    Appearance: She is well-developed. She is obese. She is not ill-appearing.  HENT:     Head: Normocephalic and atraumatic.  Cardiovascular:     Rate and Rhythm: Regular rhythm. Tachycardia present.     Heart sounds: Normal heart sounds, S1 normal and S2 normal.  Pulmonary:     Effort: Pulmonary effort is normal.     Breath sounds: Normal breath sounds and air entry.  Abdominal:     General: Abdomen is flat. Bowel sounds are increased.     Palpations: Abdomen is soft.     Tenderness:  There is abdominal tenderness in the right lower quadrant, suprapubic area and left lower quadrant.  Skin:    General: Skin is warm.  Neurological:     Mental Status: She is alert.     ED Results / Procedures / Treatments   Labs (all labs ordered are listed, but only abnormal results are displayed) Labs Reviewed  COMPREHENSIVE METABOLIC PANEL - Abnormal; Notable for the following components:      Result Value   Glucose, Bld 153 (*)    Total Protein 8.3 (*)    All other components within normal limits  CBC - Abnormal; Notable for the following components:   RBC 5.39 (*)    MCV 78.3 (*)    MCH 25.4 (*)    Platelets 409 (*)    All other components within normal limits  URINALYSIS, ROUTINE W REFLEX MICROSCOPIC - Abnormal; Notable for the following components:   APPearance CLOUDY (*)    Hgb urine dipstick TRACE (*)    All other components within normal limits  URINALYSIS, MICROSCOPIC (REFLEX) - Abnormal; Notable for the following components:   Bacteria, UA MANY (*)    All other components within normal limits  GASTROINTESTINAL PANEL BY PCR, STOOL (REPLACES STOOL CULTURE)  LIPASE, BLOOD    EKG None  Radiology No results found.  Procedures Procedures (including critical care time)  Medications Ordered in ED Medications  iohexol (OMNIPAQUE) 300 MG/ML solution 100 mL (has no administration in time range)  lactated ringers bolus 1,000 mL (1,000 mLs Intravenous New Bag/Given 02/18/20 1412)    ED Course  I have reviewed the triage vital signs and the nursing notes.  Pertinent labs & imaging results that were available during my care of the patient were reviewed by me and considered in my medical decision making (see chart for details).    MDM Rules/Calculators/A&P                           Ebony [name] is a 54 yr old female with PMH of diabetes, depression, hyperlipidemia, hypertension, hypothyroidism, SVT, tubular adenoma presents today with 6-day history of diarrhea  following eating take out. Examination: Abdomen  nondistended, soft, tenderness throughout pelvis but no guarding. Cardiac and pulmonary exam within normal limits.  Vital signs: BP 123/74, HR 104, temp 98.3, 96% on air.  CBC and CMP within normal limits except mildly elevated glucose at 153.  Lipase 36. UA shows many bacteria and trace hemoglobin. Unclear etiology of symptoms, most likely differential is gastroenteritis . Pancreatitis unlikely given normal lipase. Also considered appendicitis but would expect more localized tenderness, leukocytosis +/- urinary sx. Also considered GI malignancy given history of tubular adenomas on previous colonoscopy and recurrent symptoms although this is lower than the differential.  Discussed with attending Dr. Ralene Bathe and ordered CT abdomen pelvis which is pending.   Signed out to incoming Attending Dr Ayesha Rumpf at 3:00pm   Final Clinical Impression(s) / ED Diagnoses Final diagnoses:  None    Rx / DC Orders ED Discharge Orders    None       Lattie Haw, MD 02/18/20 [place], [place], MD 02/19/20 [phone]

## 2020-02-18 NOTE — ED Notes (Signed)
Hat, specimen cup and spoon given for at home sample collection

## 2020-02-18 NOTE — ED Provider Notes (Signed)
Care of the patient assumed at the change of shift. She has a history of presumed infectious colitis treated with cipro. Had a colonoscopy last year with no signs of IBD. Here with several days of NB diarrhea and abdominal cramping Physical Exam  BP 124/80 (BP Location: Right Arm)   Pulse 83   Temp 98.3 F (36.8 C)   Resp 17   Ht 5\' 5"  (1.651 m)   Wt 99.8 kg   LMP 12/15/2010   SpO2 98%   BMI 36.61 kg/m   Physical Exam Benign No distress ED Course/Procedures     Procedures  MDM  Labs and CT reviewed, concerning for recurrence of presumed infectious colitis. She has not had a BM since arrival. Will Rx Cipro as this has helped in the past. Refer back to GI, given materials for home stool collection.        Truddie Hidden, MD 02/18/20 [phone]

## 2020-02-18 NOTE — ED Triage Notes (Signed)
Abdominal pain and diarrhea x 6 days. No vomiting.

## 2020-02-19 ENCOUNTER — Other Ambulatory Visit: Payer: Self-pay | Admitting: Family Medicine

## 2020-02-19 ENCOUNTER — Telehealth: Payer: Self-pay | Admitting: Gastroenterology

## 2020-02-19 NOTE — Telephone Encounter (Signed)
Spoke to patient. She will bring her stool sample to the lab today

## 2020-02-20 LAB — GASTROINTESTINAL PANEL BY PCR, STOOL (REPLACES STOOL CULTURE)

## 2020-03-11 ENCOUNTER — Ambulatory Visit: Payer: 59 | Admitting: Gastroenterology

## 2020-03-25 ENCOUNTER — Ambulatory Visit (INDEPENDENT_AMBULATORY_CARE_PROVIDER_SITE_OTHER): Payer: 59 | Admitting: Gastroenterology

## 2020-03-25 ENCOUNTER — Encounter: Payer: Self-pay | Admitting: Gastroenterology

## 2020-03-25 VITALS — BP 122/84 | HR 110 | Ht 65.0 in | Wt 221.0 lb

## 2020-03-25 DIAGNOSIS — K219 Gastro-esophageal reflux disease without esophagitis: Secondary | ICD-10-CM | POA: Diagnosis not present

## 2020-03-25 DIAGNOSIS — R197 Diarrhea, unspecified: Secondary | ICD-10-CM

## 2020-03-25 DIAGNOSIS — A09 Infectious gastroenteritis and colitis, unspecified: Secondary | ICD-10-CM | POA: Diagnosis not present

## 2020-03-25 NOTE — Patient Instructions (Signed)
If you are age 52 or older, your body mass index should be between 23-30. Your Body mass index is 36.78 kg/m. If this is out of the aforementioned range listed, please consider follow up with your Primary Care Provider.  If you are age 50 or younger, your body mass index should be between 19-25. Your Body mass index is 36.78 kg/m. If this is out of the aformentioned range listed, please consider follow up with your Primary Care Provider.   Due to recent changes in healthcare laws, you may see the results of your imaging and laboratory studies on MyChart before your provider has had a chance to review them.  We understand that in some cases there may be results that are confusing or concerning to you. Not all laboratory results come back in the same time frame and the provider may be waiting for multiple results in order to interpret others.  Please give [name] 48 hours in order for your provider to thoroughly review all the results before contacting the office for clarification of your results.   Follow up as needed.

## 2020-03-25 NOTE — Progress Notes (Signed)
P  Chief Complaint:   Loose stools, diarrhea, ER follow-up  GI History: 54 year old female with a history of diabetes, depression, hyperlipidemia, HTN, hypothyroidism, SVT, follows in the GI clinic for GERD complicated by erosive esophagitis noted on EGD in 11/2018.Index symptoms ofHB and regurgitation. Wasinitiallywell controlled with Zantac, changed to Pepcid due to carcinogen profile. EGD in 11/2018 with LA Grade A esophagitis, treated with Protonix 40 mg p.o. twice daily x4 weeks,with excellent clinical response, and resolution of index reflux symptoms.  GERD-HRQL Questionnaire score: 24in 05/2018  Endoscopic history: -EGD (11/2018, Dr. Bryan [name]): LA Grade A esophagitis, otherwise normal -Colonoscopy (11/2018, Dr. Bryan [name]): 4 tubular adenoma polyps, largest 10 mm. Tortuous colon. Repeat in 3 years for ongoing surveillance.  HPI:     Patient is a 54 y.o. female presenting to the Gastroenterology Clinic for ER follow-up.  Last seen by me in the clinic on 06/10/2019 for ongoing evaluation of GERD as above.   Went to the ER on 02/18/2020 with 1 week of watery diarrhea, lower abdominal cramping.  No nausea/vomiting, medic easier, melena.  Had tried treating at home with Imodium with improvement but without resolution so she went to the ER.  Evaluation notable for the following: -CT abdomen/pelvis: Hepatic steatosis, normal upper GI tract.  Long segment mural thickening in the mid transverse colon to the level of the rectum with pericolonic stranding and increased vascularity adjacent to the mesentery consistent with colitis -Normal WBC, PLT 409 -CMP, lipase, UA -Treated with LR -Diagnosed with suspected infectious gastroenteritis/colitis and discharged with ciprofloxacin with plan for stool studies collected -GI PCR panel negative/normal   Similar presentation and 05/2019 with evaluation only notable for WBC 14.1, otherwise normal CBC, CMP, lipase-treated with ciprofloxacin  with resolution at that time.  Today, states sxs have resolved fully and back to her usual state of health. No complaints today.  Separately, reports that her reflux is well controlled after titrating down to Protonix 40 mg/day.  No breakthrough symptoms at daily dosing.   Review of systems:     No chest pain, no SOB, no fevers, no urinary sx   Past Medical History:  Diagnosis Date   Arthritis    hips     BCC (basal cell carcinoma), face    skin    Concussion with no loss of consciousness 12/28/2014   Depression with anxiety 10/06/2012   Diabetes mellitus without complication ([place])    no meds--- reports as borderline , last a1c 01-05-2019 with PCP 6.3%   GERD (gastroesophageal reflux disease) 09/05/2015   MGD WITH PPI    Headache(784.0)    otc meds prn, migraine, NOW 1-2 TIMES ANNUALLY    Hyperlipidemia, mixed 12/28/2014   Hypertension    only with preg. 53yrs ago 2002- no meds   Hypothyroid 06/15/2012   Obesity 09/05/2015   Post-operative nausea and vomiting    SVT (supraventricular tachycardia) ([place])    Tinea pedis 12/28/2014    Patient's surgical history, family medical history, social history, medications and allergies were all reviewed in Epic    Current Outpatient Medications  Medication Sig Dispense Refill   amLODipine (NORVASC) 5 MG tablet Take 1 tablet (5 mg total) by mouth daily. 30 tablet 11   Ascorbic Acid (VITAMIN C) 1000 MG tablet Take 1,000 mg by mouth every evening.     Cholecalciferol (VITAMIN D) 50 MCG (2000 UT) tablet Take 2,000 Units by mouth daily.     levothyroxine (SYNTHROID) 75 MCG tablet TAKE 1 TABLET BY MOUTH EVERY  DAY BEFORE BREAKFAST 90 tablet 3   pantoprazole (PROTONIX) 40 MG tablet Take 40 mg by mouth daily.     venlafaxine XR (EFFEXOR-XR) 75 MG 24 hr capsule TAKE ONE CAPSULE BY MOUTH EVERY DAY WITH BREAKFAST 90 capsule 1   No current facility-administered medications for this visit.    Physical Exam:     BP 122/84 (BP  Location: Right Arm, Patient Position: Sitting, Cuff Size: Normal)    Pulse (!) 110    Ht 5\' 5"  (1.651 m)    Wt 221 lb (100.2 kg)    LMP 12/15/2010    SpO2 97%    BMI 36.78 kg/m   GENERAL:  Pleasant female in NAD PSYCH: : Cooperative, normal affect Musculoskeletal:  Normal muscle tone, normal strength NEURO: Alert and oriented x 3, no focal neurologic deficits   IMPRESSION and PLAN:    1) Diarrhea 2) Colitis on CT -Symptoms have completely resolved.  Not entirely clear what the etiology was for this event and a similar event 05/2019.  Infectious work-up negative, but resolved with course of antibiotics.  Otherwise colonoscopy 2020 without mucosal/luminal pathology -No further work-up needed at this time  3) GERD with history of erosive esophagitis -Well-controlled on Protonix 40 mg/day -Resume antireflux lifestyle/dietary modifications  RTC prn      Ebony [name] ,DO, FACG 03/25/2020, 3:34 PM

## 2020-04-08 ENCOUNTER — Other Ambulatory Visit: Payer: Self-pay | Admitting: Cardiology

## 2020-04-14 ENCOUNTER — Other Ambulatory Visit: Payer: Self-pay | Admitting: Obstetrics and Gynecology

## 2020-04-14 DIAGNOSIS — Z1509 Genetic susceptibility to other malignant neoplasm: Secondary | ICD-10-CM

## 2020-04-19 ENCOUNTER — Other Ambulatory Visit: Payer: Self-pay | Admitting: Gastroenterology

## 2020-04-19 DIAGNOSIS — K219 Gastro-esophageal reflux disease without esophagitis: Secondary | ICD-10-CM

## 2020-07-04 ENCOUNTER — Telehealth: Payer: Self-pay | Admitting: Family Medicine

## 2020-07-04 NOTE — Telephone Encounter (Signed)
Paperwork placed in yellow bin °

## 2020-07-04 NOTE — Telephone Encounter (Signed)
Patient came in and dropped of form to be filled out by [facility]  Patient would like to be called when ready to be picked up  Papers put into blyth bin up front

## 2020-07-06 NOTE — Telephone Encounter (Signed)
Paperwork place upfront for pick up. Pt knows paperwork is done and ready for pick up

## 2020-07-07 ENCOUNTER — Other Ambulatory Visit: Payer: Self-pay | Admitting: Family Medicine

## 2020-07-12 ENCOUNTER — Emergency Department (HOSPITAL_BASED_OUTPATIENT_CLINIC_OR_DEPARTMENT_OTHER): Payer: 59

## 2020-07-12 ENCOUNTER — Encounter (HOSPITAL_BASED_OUTPATIENT_CLINIC_OR_DEPARTMENT_OTHER): Payer: Self-pay | Admitting: *Deleted

## 2020-07-12 ENCOUNTER — Emergency Department (HOSPITAL_BASED_OUTPATIENT_CLINIC_OR_DEPARTMENT_OTHER)
Admission: EM | Admit: 2020-07-12 | Discharge: 2020-07-12 | Disposition: A | Discharge status: Home / Self Care | Payer: 59 | Attending: Emergency Medicine | Admitting: Emergency Medicine

## 2020-07-12 DIAGNOSIS — Z79899 Other long term (current) drug therapy: Secondary | ICD-10-CM | POA: Insufficient documentation

## 2020-07-12 DIAGNOSIS — T84020A Dislocation of internal right hip prosthesis, initial encounter: Secondary | ICD-10-CM | POA: Insufficient documentation

## 2020-07-12 DIAGNOSIS — Z85828 Personal history of other malignant neoplasm of skin: Secondary | ICD-10-CM | POA: Insufficient documentation

## 2020-07-12 DIAGNOSIS — E039 Hypothyroidism, unspecified: Secondary | ICD-10-CM | POA: Insufficient documentation

## 2020-07-12 DIAGNOSIS — E119 Type 2 diabetes mellitus without complications: Secondary | ICD-10-CM | POA: Diagnosis not present

## 2020-07-12 DIAGNOSIS — S73004A Unspecified dislocation of right hip, initial encounter: Secondary | ICD-10-CM

## 2020-07-12 DIAGNOSIS — U071 COVID-19: Secondary | ICD-10-CM | POA: Insufficient documentation

## 2020-07-12 DIAGNOSIS — I10 Essential (primary) hypertension: Secondary | ICD-10-CM | POA: Diagnosis not present

## 2020-07-12 DIAGNOSIS — R52 Pain, unspecified: Secondary | ICD-10-CM

## 2020-07-12 DIAGNOSIS — M25551 Pain in right hip: Secondary | ICD-10-CM | POA: Diagnosis present

## 2020-07-12 LAB — CBC
HCT: 38.4 % (ref 36.0–46.0)
Hemoglobin: 12.7 g/dL (ref 12.0–15.0)
MCH: 25.9 pg — ABNORMAL LOW (ref 26.0–34.0)
MCHC: 33.1 g/dL (ref 30.0–36.0)
MCV: 78.2 fL — ABNORMAL LOW (ref 80.0–100.0)
Platelets: 333 10*3/uL (ref 150–400)
RBC: 4.91 MIL/uL (ref 3.87–5.11)
RDW: 14.6 % (ref 11.5–15.5)
WBC: 8.2 10*3/uL (ref 4.0–10.5)
nRBC: 0 % (ref 0.0–0.2)

## 2020-07-12 LAB — BASIC METABOLIC PANEL
Anion gap: 12 (ref 5–15)
BUN: 13 mg/dL (ref 6–20)
CO2: 23 mmol/L (ref 22–32)
Calcium: 9.2 mg/dL (ref 8.9–10.3)
Chloride: 103 mmol/L (ref 98–111)
Creatinine, Ser: 0.74 mg/dL (ref 0.44–1.00)
GFR, Estimated: >60 mL/min (ref >60)
Glucose, Bld: 116 mg/dL — ABNORMAL HIGH (ref 70–99)
Potassium: 3.5 mmol/L (ref 3.5–5.1)
Sodium: 138 mmol/L (ref 135–145)

## 2020-07-12 MED ORDER — ONDANSETRON HCL 4 MG/2ML IJ SOLN
4.0000 mg | Freq: Once | INTRAMUSCULAR | Status: AC
  Administered 2020-07-12: 4 mg via INTRAVENOUS
  Filled 2020-07-12: qty 2

## 2020-07-12 MED ORDER — PROPOFOL 10 MG/ML IV BOLUS
0.5000 mg/kg | Freq: Once | INTRAVENOUS | Status: AC
  Administered 2020-07-12: 48.8 mg via INTRAVENOUS
  Filled 2020-07-12: qty 20

## 2020-07-12 MED ORDER — HYDROMORPHONE HCL 1 MG/ML IJ SOLN
0.5000 mg | Freq: Once | INTRAMUSCULAR | Status: AC
  Administered 2020-07-12: 0.5 mg via INTRAVENOUS
  Filled 2020-07-12: qty 1

## 2020-07-12 MED ORDER — PROPOFOL 10 MG/ML IV BOLUS
INTRAVENOUS | Status: AC | PRN
  Administered 2020-07-12: 50 mg via INTRAVENOUS
  Administered 2020-07-12: 30 mg via INTRAVENOUS

## 2020-07-12 NOTE — ED Notes (Signed)
Pt drinking diet coke at this time

## 2020-07-12 NOTE — ED Triage Notes (Signed)
Pt. Has had R hip replacement on March 23,2021

## 2020-07-12 NOTE — ED Notes (Signed)
Pt arrived ems, per ems pt was sitting and felt rt hip pop out  Had hip replacement about 2 months ago,  Has happened in past but could get it to pop back in place   Pt received 250 mcg of fentanyl iv and zofran 4 mg iv  Prior to meds pain was 10/10  After meds pain 6/10

## 2020-07-12 NOTE — ED Notes (Signed)
Patient verbalizes understanding of discharge instructions. Opportunity for questioning and answers were provided. Armband removed by staff, pt discharged from ED in wheelchair to home with crutches with family as driver.

## 2020-07-12 NOTE — Sedation Documentation (Signed)
Pt husband at bedside and will be driving patient home, educated that patient can not drive and should no drink any alcohol.

## 2020-07-12 NOTE — ED Provider Notes (Signed)
[facility] EMERGENCY DEPARTMENT Provider Note   CSN: [id number] Arrival date & time: 07/12/20  1657     History Chief Complaint  Patient presents with  . Hip Pain    Ebony [name] is a 55 y.o. female who presents emergency department chief complaint of right hip pain.  She is status post right  total total hip arthroplasty 2 months ago.  Patient has noticed some feelings of clicking or popping in the hip and has been seeing PT who thought she had a snapping tendon syndrome..  Today she states that she was sitting on a chair with her right leg folded up underneath her when she went to move she felt her hip pop however it did not pop back into place and she had immediate severe pain and inability to move the hip.  She denies any numbness or tingling.  She has had 250 mg of fentanyl given by EMS prior to arrival.  HPI     Past Medical History:  Diagnosis Date  . Arthritis    hips    . BCC (basal cell carcinoma), face    skin   . Concussion with no loss of consciousness 12/28/2014  . Depression with anxiety 10/06/2012  . Diabetes mellitus without complication ([place])    no meds--- reports as borderline , last a1c 01-05-2019 with PCP 6.3%  . GERD (gastroesophageal reflux disease) 09/05/2015   MGD WITH PPI   . Headache(784.0)    otc meds prn, migraine, NOW 1-2 TIMES ANNUALLY   . Hyperlipidemia, mixed 12/28/2014  . Hypertension    only with preg. 44yr ago 2002- no meds  . Hypothyroid 06/15/2012  . Obesity 09/05/2015  . Post-operative nausea and vomiting   . SVT (supraventricular tachycardia) ([place]   . Tinea pedis 12/28/2014    Patient Active Problem List   Diagnosis Date Noted  . Essential hypertension 08/13/2019  . Monoallelic mutation of BRIP1 gene 03/30/2018  . Ovarian cancer genetic susceptibility 10/28/2017  . BCC (basal cell carcinoma), face 08/30/2017  . Obesity 09/05/2015  . GERD (gastroesophageal reflux disease) 09/05/2015  . Type 2 diabetes mellitus without  complication, without long-term current use of insulin ([place] 06/20/2015  . Hyperlipidemia, mixed 12/28/2014  . Pain of left calf 10/01/2014  . Hip pain, acute 05/27/2013  . SVT (supraventricular tachycardia) ([place] 05/06/2013  . Palpitations 01/11/2013  . Right shoulder pain 01/08/2013  . Neck pain 01/08/2013  . Depression with anxiety 10/06/2012  . Hypothyroid 06/15/2012  . Preventative health care 06/13/2012  . Headache     Past Surgical History:  Procedure Laterality Date  . ABDOMINAL HYSTERECTOMY  03/04/2012   Procedure: HYSTERECTOMY ABDOMINAL;  Surgeon: GMarylynn Pearson MD;  [facility];  Service: Gynecology;  Laterality: N/A;  with lysis of adhesions  . BASAL CELL CARCINOMA EXCISION Left    cheek/face  . CESAREAN SECTION  2002   HELLP Syndrome/ one time  . COLONOSCOPY     SAME TIME AS ENDO   . DILATION AND CURETTAGE OF UTERUS  2007   Miscarriage  . LAPAROSCOPIC LYSIS OF ADHESIONS N/A 02/17/2019   Procedure: LAPAROSCOPIC LYSIS OF ADHESIONS;  Surgeon: REveritt Amber MD;  [facility];  Service: Gynecology;  Laterality: N/A;  . LAPAROSCOPIC TUBAL LIGATION  01/03/2011   Procedure: LAPAROSCOPIC TUBAL LIGATION;  Surgeon: GMarylynn Pearson  [facility];  Service: Gynecology;  Laterality: Bilateral;  with filshie clips  . LAPAROSCOPY  03/04/2012   Procedure: LAPAROSCOPY DIAGNOSTIC;  Surgeon: GMarylynn Pearson MD;  Location: [facility];  Service: Gynecology;  Laterality: N/A;  with lysis of adhesions  . ROBOTIC ASSISTED BILATERAL SALPINGO OOPHERECTOMY Bilateral 02/17/2019   Procedure: XI ROBOTIC ASSISTED BILATERAL SALPINGO OOPHORECTOMY;  Surgeon: Everitt Amber, MD;  Location: [facility];  Service: Gynecology;  Laterality: Bilateral;  . SHOULDER ARTHROSCOPY  2012   right  . SVT ABLATION N/A 12/17/2018   Procedure: SVT ABLATION;  Surgeon: Evans Lance, MD;  Location: [facility];  Service: Cardiovascular;  Laterality: N/A;  . TONSILLECTOMY    . UPPER GI ENDOSCOPY       OB  History   No obstetric history on file.     Family History  Problem Relation Age of Onset  . Hypertension Mother   . Depression Mother   . Breast cancer Mother 23  . Lung cancer Mother        lung (06-13-11)  . Basal cell carcinoma Brother 40  . Other Maternal Grandmother        spinal stenosis  . Emphysema Maternal Grandfather   . Prostate cancer Maternal Grandfather   . Breast cancer Cousin 38  . Colon cancer Neg Hx   . Esophageal cancer Neg Hx   . Pancreatic cancer Neg Hx   . Stomach cancer Neg Hx     Social History   Tobacco Use  . Smoking status: Never Smoker  . Smokeless tobacco: Never Used  Vaping Use  . Vaping Use: Never used  Substance Use Topics  . Alcohol use: Yes    Comment: occasionally   . Drug use: No    Home Medications Prior to Admission medications   Medication Sig Start Date End Date Taking? Authorizing Provider  amLODipine (NORVASC) 5 MG tablet TAKE 1 TABLET BY MOUTH EVERY DAY 04/08/20   Lelon Perla, MD  Ascorbic Acid (VITAMIN C) 1000 MG tablet Take 1,000 mg by mouth every evening.    [provider]  Cholecalciferol (VITAMIN D) 50 MCG (2000 UT) tablet Take 2,000 Units by mouth daily.    [provider]  levothyroxine (SYNTHROID) 75 MCG tablet TAKE 1 TABLET BY MOUTH EVERY DAY BEFORE BREAKFAST 07/07/20   Mosie Lukes, MD  pantoprazole (PROTONIX) 40 MG tablet TAKE 1 TABLET BY MOUTH TWICE A DAY 04/19/20   Cirigliano, Vito V, DO  venlafaxine XR (EFFEXOR-XR) 75 MG 24 hr capsule TAKE ONE CAPSULE BY MOUTH EVERY DAY WITH BREAKFAST 02/11/20   Mosie Lukes, MD    Allergies    Patient has no known allergies.  Review of Systems   Review of Systems Ten systems reviewed and are negative for acute change, except as noted in the HPI.   Physical Exam Updated Vital Signs BP 128/80 (BP Location: Right Arm)   Pulse 77   Temp 98.5 F (36.9 C) (Oral)   Resp 18   Ht 5' 5" (1.651 m)   Wt 97.5 kg   LMP 12/15/2010   SpO2 98%   BMI  35.78 kg/m   Physical Exam Vitals and nursing note reviewed.  Constitutional:      General: She is not in acute distress.    Appearance: She is well-developed and well-nourished. She is not diaphoretic.  HENT:     Head: Normocephalic and atraumatic.  Eyes:     General: No scleral icterus.    Conjunctiva/sclera: Conjunctivae normal.  Cardiovascular:     Rate and Rhythm: Normal rate and regular rhythm.     Heart sounds: Normal heart sounds. No murmur heard.  No friction rub. No gallop.   Pulmonary:     Effort: Pulmonary effort is normal. No respiratory distress.     Breath sounds: Normal breath sounds.  Abdominal:     General: Bowel sounds are normal. There is no distension.     Palpations: Abdomen is soft. There is no mass.     Tenderness: There is no abdominal tenderness. There is no guarding.  Musculoskeletal:     Cervical back: Normal range of motion.     Comments: R hip leg appears shortened. NVI 2+ dp/pt pulse Unable to tolerate any movement of the r hip passive or active  Skin:    General: Skin is warm and dry.  Neurological:     Mental Status: She is alert and oriented to person, place, and time.  Psychiatric:        Behavior: Behavior normal.     ED Results / Procedures / Treatments   Labs (all labs ordered are listed, but only abnormal results are displayed) Labs Reviewed  SARS CORONAVIRUS 2 (TAT 6-24 HRS)  CBC  BASIC METABOLIC PANEL    EKG None  Radiology No results found.  Procedures Reduction of dislocation  Date/Time: 07/12/2020 8:25 PM Performed by: Margarita Mail, PA-C Authorized by: Margarita Mail, PA-C  Consent: Written consent obtained. Risks and benefits: risks, benefits and alternatives were discussed Consent given by: patient Patient understanding: patient states understanding of the procedure being performed Patient consent: the patient's understanding of the procedure matches consent given Procedure consent: procedure consent  matches procedure scheduled Relevant documents: relevant documents present and verified Test results: test results available and properly labeled Site marked: the operative site was marked Imaging studies: imaging studies available Required items: required blood products, implants, devices, and special equipment available Patient identity confirmed: verbally with patient and arm band Time out: Immediately prior to procedure a "time out" was called to verify the correct patient, procedure, equipment, support staff and site/side marked as required. Preparation: Patient was prepped and draped in the usual sterile fashion.  Sedation: Patient sedated: yes (See sedation not by Dr. Lajean Saver)  Patient tolerance: patient tolerated the procedure well with no immediate complications Comments: Patient hip successfully reduced       Medications Ordered in ED Medications  propofol (DIPRIVAN) 10 mg/mL bolus/IV push 48.8 mg (has no administration in time range)  HYDROmorphone (DILAUDID) injection 0.5 mg (0.5 mg Intravenous Given 07/12/20 1759)  ondansetron (ZOFRAN) injection 4 mg (4 mg Intravenous Given 07/12/20 1758)    ED Course  I have reviewed the triage vital signs and the nursing notes.  Pertinent labs & imaging results that were available during my care of the patient were reviewed by me and considered in my medical decision making (see chart for details).    MDM Rules/Calculators/A&P                          55 year old female here with right hip pain.  She had a dislocation of her right hip prosthesis.  The right hip was successfully reduced after conscious sedation procedure.  Patient placed in knee immobilizer and given crutches.  She has no pain at this time.  She was allowed to regain full consciousness.  Her husband is driving her home.  She appears otherwise appropriate for discharge at this time.  She is to follow closely with Dr. Wynelle Link as directed. Final Clinical Impression(s)  / ED Diagnoses Final diagnoses:  Pain    Rx /  DC Orders ED Discharge Orders    None       Margarita Mail, PA-C 07/12/20 2030    Lajean Saver, MD 07/12/20 2109

## 2020-07-12 NOTE — ED Provider Notes (Signed)
.  Sedation  Date/Time: 07/12/2020 8:25 PM Performed by: Lajean Saver, MD Authorized by: Lajean Saver, MD   Consent:    Consent obtained:  Verbal and written   Consent given by:  Patient Universal protocol:    Procedure explained and questions answered to patient or proxy's satisfaction: yes     Relevant documents present and verified: yes     Test results available: yes     Imaging studies available: yes     Required blood products, implants, devices, and special equipment available: yes     Site/side marked: yes     Immediately prior to procedure, a time out was called: yes     Patient identity confirmed:  Arm band and provided demographic data Indications:    Procedure necessitating sedation performed by:  Physician performing sedation Pre-sedation assessment:    Time since last food or drink:  3   ASA classification: class 1 - normal, healthy patient     Mallampati score:  II - soft palate, uvula, fauces visible   Pre-sedation assessments completed and reviewed: airway patency, cardiovascular function, hydration status, mental status, nausea/vomiting, pain level, respiratory function and temperature     Pre-sedation assessment completed:  07/12/2020 6:00 PM Immediate pre-procedure details:    Reassessment: Patient reassessed immediately prior to procedure     Reviewed: vital signs and relevant labs/tests     Verified: bag valve mask available, emergency equipment available, intubation equipment available, IV patency confirmed, oxygen available, reversal medications available and suction available   Procedure details (see MAR for exact dosages):    Preoxygenation:  Nasal cannula   Sedation:  Propofol   Intended level of sedation: deep   Analgesia:  Fentanyl   Intra-procedure monitoring:  Blood pressure monitoring, cardiac monitor, continuous capnometry, continuous pulse oximetry, frequent vital sign checks and frequent LOC assessments   Intra-procedure events: none     Total  Provider sedation time (minutes):  20 Post-procedure details:    Post-sedation assessment completed:  07/12/2020 7:00 PM   Attendance: Constant attendance by certified staff until patient recovered     Recovery: Patient returned to pre-procedure baseline     Post-sedation assessments completed and reviewed: airway patency, cardiovascular function, hydration status, mental status, nausea/vomiting, pain level, respiratory function and temperature     Patient is stable for discharge or admission: yes     Procedure completion:  Tolerated well, no immediate complications      Lajean Saver, MD 07/12/20 2028

## 2020-07-12 NOTE — Sedation Documentation (Signed)
XR at bedside

## 2020-07-12 NOTE — Discharge Instructions (Addendum)
Do not bear weight on the right leg until you have seen Dr. Wynelle Link and can follow-up for further evaluation.  Do not lift anything over 10 pounds.  Try to keep your knee immobilizer on at all times.  You may remove it for bathing but keep your leg straight and again do not bear weight on the leg.  Flexing your knee and bending your hip can cause it to dislocate again so it is very important to keep the leg straight. You may use Motrin or Tylenol for pain relief if you are having some soreness tomorrow.  He may also apply ice to the hip. Return for the reasons listed below. Get help right away if you: Feel like your hip has dislocated again. Have severe pain in your hip or groin. Have numbness or weakness in your leg. Cannot move any part of your hip or leg.

## 2020-07-12 NOTE — Sedation Documentation (Signed)
Provider attempting to place hip at this time

## 2020-07-12 NOTE — Sedation Documentation (Signed)
Pt denies pain at this time

## 2020-07-12 NOTE — ED Notes (Signed)
Pt still speaking to staff, states she still has pain in right hip, will not rate on numerical scale

## 2020-07-13 ENCOUNTER — Ambulatory Visit: Payer: 59 | Admitting: Medical

## 2020-07-13 LAB — SARS CORONAVIRUS 2 (TAT 6-24 HRS): SARS Coronavirus 2: POSITIVE — AB

## 2020-07-14 ENCOUNTER — Telehealth: Payer: Self-pay

## 2020-07-14 NOTE — Telephone Encounter (Signed)
Called to discuss with patient about COVID-19 symptoms and the use of one of the available treatments for those with mild to moderate Covid symptoms and at a high risk of hospitalization.  Pt appears to qualify for outpatient treatment due to co-morbid conditions and/or a member of an at-risk group in accordance with the FDA Emergency Use Authorization.    Symptom onset: Reports no symptoms Vaccinated: No Booster? No Immunocompromised? No Qualifiers: HTN, Obesity  Pt. Reports she had "COVID in January and I think I'm still showing positive from that." No symptoms.  Ebony [name]

## 2020-08-11 ENCOUNTER — Other Ambulatory Visit: Payer: Self-pay | Admitting: Family Medicine

## 2020-08-16 NOTE — ED Provider Notes (Signed)
.  Sedation  Date/Time: 08/16/2020 5:02 PM Performed by: Lajean Saver, MD Authorized by: Lajean Saver, MD   Consent:    Consent obtained:  Verbal   Consent given by:  Patient   Risks discussed:  Allergic reaction, dysrhythmia, inadequate sedation, nausea, prolonged hypoxia resulting in organ damage, prolonged sedation necessitating reversal, respiratory compromise necessitating ventilatory assistance and intubation and vomiting   Alternatives discussed:  Analgesia without sedation, anxiolysis and regional anesthesia Universal protocol:    Procedure explained and questions answered to patient or proxy's satisfaction: yes     Relevant documents present and verified: yes     Test results available: yes     Imaging studies available: yes     Required blood products, implants, devices, and special equipment available: yes     Site/side marked: yes     Immediately prior to procedure, a time out was called: yes     Patient identity confirmed:  Verbally with patient, arm band and hospital-assigned identification number Indications:    Procedure performed:  Dislocation reduction   Procedure necessitating sedation performed by:  Physician performing sedation Pre-sedation assessment:    Time since last food or drink:  2   ASA classification: class 1 - normal, healthy patient     Mouth opening:  3 or more finger widths   Thyromental distance:  4 finger widths   Mallampati score:  I - soft palate, uvula, fauces, pillars visible   Neck mobility: normal     Pre-sedation assessments completed and reviewed: airway patency, cardiovascular function, hydration status, mental status, nausea/vomiting, pain level, respiratory function and temperature   Immediate pre-procedure details:    Reassessment: Patient reassessed immediately prior to procedure     Reviewed: vital signs, relevant labs/tests and NPO status     Verified: bag valve mask available, emergency equipment available, intubation equipment  available, IV patency confirmed, oxygen available and suction available   Procedure details (see MAR for exact dosages):    Preoxygenation:  Nasal cannula   Sedation:  Propofol   Intended level of sedation: deep   Intra-procedure monitoring:  Blood pressure monitoring, cardiac monitor, continuous pulse oximetry, frequent LOC assessments, frequent vital sign checks and continuous capnometry   Intra-procedure events: none     Total Provider sedation time (minutes):  25 Post-procedure details:    Post-sedation assessment completed:  07/12/2020 7:30 PM   Attendance: Constant attendance by certified staff until patient recovered     Recovery: Patient returned to pre-procedure baseline     Post-sedation assessments completed and reviewed: airway patency, cardiovascular function, hydration status, mental status, nausea/vomiting, pain level, respiratory function and temperature     Patient is stable for discharge or admission: yes     Procedure completion:  Tolerated well, no immediate complications      Lajean Saver, MD 08/16/20 1703

## 2020-08-18 ENCOUNTER — Ambulatory Visit
Admission: RE | Admit: 2020-08-18 | Discharge: 2020-08-18 | Disposition: A | Discharge status: Home / Self Care | Payer: 59 | Source: Ambulatory Visit | Attending: Obstetrics and Gynecology | Admitting: Obstetrics and Gynecology

## 2020-08-18 DIAGNOSIS — Z1509 Genetic susceptibility to other malignant neoplasm: Secondary | ICD-10-CM

## 2020-08-18 MED ORDER — GADOBUTROL 1 MMOL/ML IV SOLN
9.0000 mL | Freq: Once | INTRAVENOUS | Status: AC | PRN
  Administered 2020-08-18: 9 mL via INTRAVENOUS

## 2020-08-19 ENCOUNTER — Other Ambulatory Visit: Payer: Self-pay | Admitting: Obstetrics and Gynecology

## 2020-08-19 DIAGNOSIS — R9389 Abnormal findings on diagnostic imaging of other specified body structures: Secondary | ICD-10-CM

## 2020-08-29 ENCOUNTER — Ambulatory Visit
Admission: RE | Admit: 2020-08-29 | Discharge: 2020-08-29 | Disposition: A | Discharge status: Home / Self Care | Payer: 59 | Source: Ambulatory Visit | Attending: Obstetrics and Gynecology | Admitting: Obstetrics and Gynecology

## 2020-08-29 ENCOUNTER — Other Ambulatory Visit: Payer: Self-pay

## 2020-08-29 ENCOUNTER — Ambulatory Visit: Admission: RE | Admit: 2020-08-29 | Payer: 59 | Source: Ambulatory Visit

## 2020-08-29 DIAGNOSIS — R9389 Abnormal findings on diagnostic imaging of other specified body structures: Secondary | ICD-10-CM

## 2020-08-31 ENCOUNTER — Other Ambulatory Visit: Payer: Self-pay

## 2020-08-31 ENCOUNTER — Ambulatory Visit
Admission: RE | Admit: 2020-08-31 | Discharge: 2020-08-31 | Disposition: A | Discharge status: Home / Self Care | Payer: 59 | Source: Ambulatory Visit | Attending: Obstetrics and Gynecology | Admitting: Obstetrics and Gynecology

## 2020-08-31 ENCOUNTER — Ambulatory Visit: Admission: RE | Admit: 2020-08-31 | Payer: 59 | Source: Ambulatory Visit

## 2020-08-31 ENCOUNTER — Other Ambulatory Visit: Payer: Self-pay | Admitting: Obstetrics and Gynecology

## 2020-08-31 DIAGNOSIS — R9389 Abnormal findings on diagnostic imaging of other specified body structures: Secondary | ICD-10-CM

## 2020-08-31 MED ORDER — GADOBUTROL 1 MMOL/ML IV SOLN
10.0000 mL | Freq: Once | INTRAVENOUS | Status: AC | PRN
  Administered 2020-08-31: 10 mL via INTRAVENOUS

## 2021-01-16 ENCOUNTER — Other Ambulatory Visit: Payer: Self-pay | Admitting: Obstetrics and Gynecology

## 2021-01-16 DIAGNOSIS — N6029 Fibroadenosis of unspecified breast: Secondary | ICD-10-CM

## 2021-01-16 DIAGNOSIS — N6019 Diffuse cystic mastopathy of unspecified breast: Secondary | ICD-10-CM

## 2021-02-16 ENCOUNTER — Other Ambulatory Visit: Payer: Self-pay | Admitting: Family Medicine

## 2021-02-28 ENCOUNTER — Other Ambulatory Visit: Payer: Self-pay

## 2021-02-28 ENCOUNTER — Ambulatory Visit
Admission: RE | Admit: 2021-02-28 | Discharge: 2021-02-28 | Disposition: A | Discharge status: Home / Self Care | Payer: 59 | Source: Ambulatory Visit | Attending: Obstetrics and Gynecology | Admitting: Obstetrics and Gynecology

## 2021-02-28 DIAGNOSIS — N6019 Diffuse cystic mastopathy of unspecified breast: Secondary | ICD-10-CM

## 2021-02-28 DIAGNOSIS — N6029 Fibroadenosis of unspecified breast: Secondary | ICD-10-CM

## 2021-02-28 MED ORDER — GADOBUTROL 1 MMOL/ML IV SOLN
10.0000 mL | Freq: Once | INTRAVENOUS | Status: AC | PRN
  Administered 2021-02-28: 10 mL via INTRAVENOUS

## 2021-04-13 ENCOUNTER — Telehealth: Payer: Self-pay

## 2021-04-13 NOTE — Telephone Encounter (Signed)
[facility] - Client Client Site [facility] - Night Physician Penni Homans - MD Contact Type Call Who Is Calling Patient / Member / Family / Caregiver Caller Name [place] Phone Number [phone] Patient Name Ebony [name] Patient DOB December 08, 1965 Call Type Message Only Information Provided Reason for Call Request to Schedule Office Appointment Initial Comment Caller states that she is calling to schedule an appointment. Patient request to speak to RN No Additional Comment Caller declined triage and just asked that a message be sent to the office requesting a call back to schedule her physical and lab work that she is due for.

## 2021-04-14 NOTE — Telephone Encounter (Signed)
scheduled

## 2021-04-20 ENCOUNTER — Other Ambulatory Visit: Payer: Self-pay | Admitting: Cardiology

## 2021-05-18 ENCOUNTER — Other Ambulatory Visit: Payer: Self-pay | Admitting: Cardiology

## 2021-05-18 ENCOUNTER — Other Ambulatory Visit: Payer: Self-pay | Admitting: Family Medicine

## 2021-06-05 ENCOUNTER — Other Ambulatory Visit: Payer: Self-pay | Admitting: Cardiology

## 2021-06-13 ENCOUNTER — Other Ambulatory Visit: Payer: Self-pay | Admitting: Cardiology

## 2021-06-28 ENCOUNTER — Telehealth: Payer: Self-pay | Admitting: Cardiology

## 2021-06-28 MED ORDER — AMLODIPINE BESYLATE 5 MG PO TABS: ORAL_TABLET | ORAL | 0 refills | Status: DC

## 2021-06-28 NOTE — Telephone Encounter (Signed)
Medication has been sent in 

## 2021-06-28 NOTE — Telephone Encounter (Signed)
*  STAT* If patient is at the pharmacy, call can be transferred to refill team.   1. Which medications need to be refilled? (please list name of each medication and dose if known)  amLODipine (NORVASC) 5 MG tablet  2. Which pharmacy/location (including street and city if local pharmacy) is medication to be sent to? [facility], [place] [address]  3. Do they need a 30 day or 90 day supply?   Patient has 2 tablets remaining and is scheduled for 03/20 with Almyra Deforest, PA. She is requesting enough medication to last her until this appointment.

## 2021-06-29 ENCOUNTER — Ambulatory Visit: Payer: 59 | Admitting: Family Medicine

## 2021-07-14 ENCOUNTER — Other Ambulatory Visit: Payer: Self-pay | Admitting: Gastroenterology

## 2021-07-14 DIAGNOSIS — K219 Gastro-esophageal reflux disease without esophagitis: Secondary | ICD-10-CM

## 2021-07-22 ENCOUNTER — Other Ambulatory Visit: Payer: Self-pay | Admitting: Family Medicine

## 2021-07-23 ENCOUNTER — Other Ambulatory Visit: Payer: Self-pay | Admitting: Cardiology

## 2021-08-09 ENCOUNTER — Other Ambulatory Visit (HOSPITAL_BASED_OUTPATIENT_CLINIC_OR_DEPARTMENT_OTHER): Payer: Self-pay

## 2021-08-09 ENCOUNTER — Ambulatory Visit: Payer: 59 | Admitting: Medical

## 2021-08-09 VITALS — BP 125/75 | HR 77 | Temp 97.7°F | Resp 18 | Ht 65.0 in | Wt 217.0 lb

## 2021-08-09 DIAGNOSIS — G4700 Insomnia, unspecified: Secondary | ICD-10-CM | POA: Diagnosis not present

## 2021-08-09 DIAGNOSIS — F418 Other specified anxiety disorders: Secondary | ICD-10-CM | POA: Diagnosis not present

## 2021-08-09 MED ORDER — CLONAZEPAM 0.5 MG PO TABS: 0.5000 mg | ORAL_TABLET | Freq: Two times a day (BID) | ORAL | 0 refills | Status: DC | PRN

## 2021-08-09 MED ORDER — CLONAZEPAM 0.5 MG PO TABS
0.5000 mg | ORAL_TABLET | Freq: Two times a day (BID) | ORAL | 0 refills | Status: DC | PRN
Start: 2021-08-09 — End: 2021-08-17
  Filled 2021-08-09: qty 14, 7d supply, fill #0

## 2021-08-09 NOTE — Patient Instructions (Addendum)
Recent severe anxiety, insomnia and lesser degree depressed mood.  Continue Effexor.  Prescription of clonazepam 0.5 mg to use twice daily as needed anxiety.  Hopefully with nighttime use of clonazepam you will also be able to sleep better.  If not then please let me know by this Friday.  In that event would add on trazodone. ? ?Do recommend attending grief counseling. ? ?If signs symptoms worsen or change let us know. ? ?After 1 week use of clonazepam but could switch you to BuSpar and provide you with limited number of clonazepam for breakthrough anxiety/panic attack. ? ?Follow-up in 1 week video visit or sooner if needed. ?

## 2021-08-09 NOTE — Progress Notes (Signed)
? ?Subjective:  ? ? Patient ID: Ebony [name], female    DOB: 16-Sep-1965, 56 y.o.   MRN: [id number] ? ?HPI ? ?Pt in for anxiety and insomnia. ? ?She states around December she went home and her dad was hospitalized and then just recently he passed away in last month.  ? ?Mom passed away 9 years ago. Her passing was more peaceful. ? ?Dad passing describes as traumatic.  ? ?Pt has 2 siblings.  ? ?She states she has not been someone in past that struggled with anxiety. Describes being in constant state of alert. Very restless. Sleeping at most one hour at night. Hx of depression but states he issue is more extreme anxiety. Her level of anxiety feels like has 9 cups of coffee.  ? ?Pt is working part time.  ? ?She has appointment for grief counseling.  ? ?Pt GAD-7 score was 21 today. ? ? ?Review of Systems  ?Constitutional:  Negative for chills and fever.  ?Cardiovascular:  Negative for chest pain and palpitations.  ?Gastrointestinal:  Negative for abdominal pain.  ?Skin:  Negative for rash.  ?Psychiatric/Behavioral:  Positive for dysphoric mood. Negative for sleep disturbance and suicidal ideas. The patient is nervous/anxious.   ? ?Past Medical History:  ?Diagnosis Date  ? Arthritis   ? hips    ? BCC (basal cell carcinoma), face   ? skin   ? Concussion with no loss of consciousness 12/28/2014  ? Depression with anxiety 10/06/2012  ? Diabetes mellitus without complication ([place])   ? no meds--- reports as borderline , last a1c 01-05-2019 with PCP 6.3%  ? GERD (gastroesophageal reflux disease) 09/05/2015  ? MGD WITH PPI   ? Headache(784.0)   ? otc meds prn, migraine, NOW 1-2 TIMES ANNUALLY   ? Hyperlipidemia, mixed 12/28/2014  ? Hypertension   ? only with preg. 67yr ago 2002- no meds  ? Hypothyroid 06/15/2012  ? Obesity 09/05/2015  ? Post-operative nausea and vomiting   ? SVT (supraventricular tachycardia) ([place]   ? Tinea pedis 12/28/2014  ? ?  ?Social History  ? ?Socioeconomic History  ? Marital status: Married  ?  Spouse name:  Not on file  ? Number of children: 1  ? Years of education: Not on file  ? Highest education level: Not on file  ?Occupational History  ?  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 Smoking status: Never  ? Smokeless tobacco: Never  ?Vaping Use  ? Vaping Use: Never used  ?Substance and Sexual Activity  ? Alcohol use: Yes  ?  Comment: occasionally   ? Drug use: No  ? Sexual activity: Yes  ?  Birth control/protection: Surgical  ?Other Topics Concern  ? Not on file  ?Social History Narrative  ? Not on file  ? ?Social Determinants of Health  ? ?Financial Resource Strain: Not on file  ?Food Insecurity: Not on file  ?Transportation Needs: Not on file  ?Physical Activity: Not on file  ?Stress: Not on file  ?Social Connections: Not on file  ?Intimate Partner Violence: Not on file  ? ? ?Past Surgical History:  ?Procedure Laterality Date  ? ABDOMINAL HYSTERECTOMY  03/04/2012  ? Procedure: HYSTERECTOMY ABDOMINAL;  Surgeon: GMarylynn Pearson MD;  [facility];  Service: Gynecology;  Laterality: N/A;  with lysis of adhesions  ? BASAL CELL CARCINOMA EXCISION Left   ? cheek/face  ? CESAREAN SECTION  2002  ? HELLP Syndrome/ one time  ? COLONOSCOPY    ? SAME TIME AS  ENDO   ? DILATION AND CURETTAGE OF UTERUS  2007  ? Miscarriage  ? LAPAROSCOPIC LYSIS OF ADHESIONS N/A 02/17/2019  ? Procedure: LAPAROSCOPIC LYSIS OF ADHESIONS;  Surgeon: Everitt Amber, MD;  [facility];  Service: Gynecology;  Laterality: N/A;  ? LAPAROSCOPIC TUBAL LIGATION  01/03/2011  ? Procedure: LAPAROSCOPIC TUBAL LIGATION;  Surgeon: Marylynn Pearson;  Location: [facility];  Service: Gynecology;  Laterality: Bilateral;  with filshie clips  ? LAPAROSCOPY  03/04/2012  ? Procedure: LAPAROSCOPY DIAGNOSTIC;  Surgeon: Marylynn Pearson, MD;  Location: [facility];  Service: Gynecology;  Laterality: N/A;  with lysis of adhesions  ? ROBOTIC ASSISTED BILATERAL SALPINGO OOPHERECTOMY Bilateral 02/17/2019  ? Procedure: XI ROBOTIC ASSISTED BILATERAL SALPINGO OOPHORECTOMY;  Surgeon: Everitt Amber, MD;   Location: [facility];  Service: Gynecology;  Laterality: Bilateral;  ? SHOULDER ARTHROSCOPY  2012  ? right  ? SVT ABLATION N/A 12/17/2018  ? Procedure: SVT ABLATION;  Surgeon: Evans Lance, MD;  Location: [facility];  Service: Cardiovascular;  Laterality: N/A;  ? TONSILLECTOMY    ? UPPER GI ENDOSCOPY    ? ? ?Family History  ?Problem Relation Age of Onset  ? Hypertension Mother   ? Depression Mother   ? Breast cancer Mother 32  ? Lung cancer Mother   ?     lung (06-13-11)  ? Basal cell carcinoma Brother 40  ? Other Maternal Grandmother   ?     spinal stenosis  ? Emphysema Maternal Grandfather   ? Prostate cancer Maternal Grandfather   ? Breast cancer Cousin 80  ? Colon cancer Neg Hx   ? Esophageal cancer Neg Hx   ? Pancreatic cancer Neg Hx   ? Stomach cancer Neg Hx   ? ? ?No Known Allergies ? ?Current Outpatient Medications on File Prior to Visit  ?Medication Sig Dispense Refill  ? amLODipine (NORVASC) 5 MG tablet TAKE 1 TABLET BY MOUTH DAILY. NEED TO SCHEDULE ANNUAL APPOINTMENT FOR FUTURE REFILLS. FIRST ATTEMPT. 30 tablet 1  ? Ascorbic Acid (VITAMIN C) 1000 MG tablet Take 1,000 mg by mouth every evening.    ? Cholecalciferol (VITAMIN D) 50 MCG (2000 UT) tablet Take 2,000 Units by mouth daily.    ? levothyroxine (SYNTHROID) 75 MCG tablet TAKE 1 TABLET BY MOUTH EVERY DAY BEFORE BREAKFAST 90 tablet 3  ? pantoprazole (PROTONIX) 40 MG tablet TAKE 1 TABLET BY MOUTH TWICE A DAY 180 tablet 5  ? venlafaxine XR (EFFEXOR-XR) 75 MG 24 hr capsule TAKE 1 CAPSULE BY MOUTH DAILY WITH BREAKFAST. NEEDS OFFICE VISIT BEFORE ANY FURTHER REFILLS 90 capsule 0  ? ?No current facility-administered medications on file prior to visit.  ? ? ?BP 125/75   Pulse 77   Temp 97.7 ?F (36.5 ?C)   Resp 18   Ht '5\' 5"'$  (1.651 m)   Wt 217 lb (98.4 kg)   LMP 12/15/2010   SpO2 96%   BMI 36.11 kg/m?  ?  ? ?   ?Objective:  ? Physical Exam ? ?General ?Mental Status- Alert. General Appearance- Not in acute distress. But anxous and cries some during  interview. ?. ? ?Chest and Lung Exam ?Auscultation: ?Breath Sounds:-Normal. ? ?Cardiovascular ?Auscultation:Rythm- Regular. ?Murmurs & Other Heart Sounds:Auscultation of the heart reveals- No Murmurs. ? ? ? ?Neurologic ?Cranial Nerve exam:- CN III-XII intact(No nystagmus), symmetric smile. ?Strength:- 5/5 equal and symmetric strength both upper and lower extremities.  ? ? ?   ?Assessment & Plan:  ? ?Patient Instructions  ?Recent severe anxiety, insomnia  and lesser degree depressed mood.  Continue Effexor.  Prescription of clonazepam 0.5 mg to use twice daily as needed anxiety.  Hopefully with nighttime use of clonazepam you will also be able to sleep better.  If not then please let me know by this Friday.  In that event would add on trazodone. ? ?Do recommend attending grief counseling. ? ?If signs symptoms worsen or change let us know. ? ?After 1 week use of clonazepam but could switch you to BuSpar and provide you with limited number of clonazepam for breakthrough anxiety/panic attack. ? ?Follow-up in 1 week video visit or sooner if needed.  ?

## 2021-08-15 ENCOUNTER — Other Ambulatory Visit: Payer: Self-pay | Admitting: Cardiology

## 2021-08-17 ENCOUNTER — Telehealth: Payer: Self-pay | Admitting: Medical

## 2021-08-17 ENCOUNTER — Other Ambulatory Visit: Payer: Self-pay

## 2021-08-17 ENCOUNTER — Telehealth (INDEPENDENT_AMBULATORY_CARE_PROVIDER_SITE_OTHER): Payer: 59 | Admitting: Medical

## 2021-08-17 DIAGNOSIS — G4700 Insomnia, unspecified: Secondary | ICD-10-CM

## 2021-08-17 DIAGNOSIS — F418 Other specified anxiety disorders: Secondary | ICD-10-CM

## 2021-08-17 MED ORDER — CLONAZEPAM 0.5 MG PO TABS: 0.5000 mg | ORAL_TABLET | Freq: Two times a day (BID) | ORAL | 0 refills | Status: DC | PRN

## 2021-08-17 NOTE — Patient Instructions (Addendum)
Recent severe anxiety with depressed mood.  However anxiety much worse.  In addition insomnia.  When on clonazepam over the past week anxiety was much/improved.  Now over the last 24 hours well medication having recurrent moderate to severe anxiety as before.  Do recommend that you continue Effexor and do think continuing clonazepam twice daily would be beneficial.  I gave you additional 7 tablet prescription and we will send message to your PCP confirming that she is in agreement with you being on monthly clonazepam.  If she agrees we will have the sign controlled medication contract and UDS.  We discussed on how he could taper off medication in the future. ? ?Glad to hear that you are not getting scheduled for counseling. ? ?Follow-up in approximately 1 month or sooner if needed ?

## 2021-08-17 NOTE — Telephone Encounter (Signed)
Dr. Charlett Blake, ? ?I sent you note today on pt. if you would see last visit note as well.  Patient is extremely anxious and recently benefited a lot with 1 week of low-dose clonazepam in addition to her Effexor.  She has been off the medication for 1 day and extreme anxiety is returning.  Wanted your clearance/okay giving her monthly prescription.  If you are okay with that then I would have her sign contract and come in for UDS ? ?Thanks, ? ?Percell Miller ?

## 2021-08-17 NOTE — Telephone Encounter (Signed)
Let pt know Dr. Charlett Blake in agreement with [name] prescriing clonazepam on montly basis. Can you get her scheduled within a wee for nurse visit to sign contract and give uds. ?

## 2021-08-17 NOTE — Progress Notes (Signed)
? ?  Subjective:  ? ? Patient ID: Ebony [name], female    DOB: Mar 27, 1966, 56 y.o.   MRN: [id number] ? ?HPI ?Virtual Visit via Video Note ? ?I connected with Ebony [name] on 08/17/21 at  9:20 AM EDT by a video enabled telemedicine application and verified that I am speaking with the correct person using two identifiers. ? ?Location: ?Patient: home ?Provider: office ?  ?I discussed the limitations of evaluation and management by telemedicine and the availability of in person appointments. The patient expressed understanding and agreed to proceed. ? ?History of Present Illness: ? ? Pt has recent severe anxiety. See last note. ? ?Pt states she feels better overall. Pt had used clonazepam bid. She has been off med now for one day. She states med decreased anxiety a lot and was able to sleep over past week. ? ?Now after not being on medicine feeling the same previously. Nervous, jittery and insomnia. Some social anxiety as well. ? ? ?Observations/Objective: ?General-no acute distress, pleasant, oriented. ?Lungs- on inspection lungs appear unlabored. ?Neck- no tracheal deviation or jvd on inspection. ?Neuro- gross motor function appears intact.  ? ?Assessment and Plan: ?Patient Instructions  ?Recent severe anxiety with depressed mood.  However anxiety much worse.  In addition insomnia.  When on clonazepam over the past week anxiety was much/improved.  Now over the last 24 hours well medication having recurrent moderate to severe anxiety as before.  Do recommend that you continue Effexor and do think continuing clonazepam twice daily would be beneficial.  I gave you additional 7 tablet prescription and we will send message to your PCP confirming that she is in agreement with you being on monthly clonazepam.  If she agrees we will have the sign controlled medication contract and UDS.  We discussed on how he could taper off medication in the future. ? ?Glad to hear that you are not getting scheduled for  counseling. ? ?Follow-up in approximately 1 month or sooner if needed  ? ?Mackie Pai, PA-C  ? ?Follow Up Instructions: ? ?  ?I discussed the assessment and treatment plan with the patient. The patient was provided an opportunity to ask questions and all were answered. The patient agreed with the plan and demonstrated an understanding of the instructions. ?  ?The patient was advised to call back or seek an in-person evaluation if the symptoms worsen or if the condition fails to improve as anticipated. ? ?Time spent with patient today was   minutes which consisted of chart revdiew, discussing diagnosis, work up treatment and documentation.  ? ? ?Mackie Pai, PA-C  ? ? ?Review of Systems ? ?   ?Objective:  ? Physical Exam ? ? ? ? ?   ?Assessment & Plan:  ? ? ?

## 2021-08-17 NOTE — Telephone Encounter (Signed)
Called pt and lab appt made 

## 2021-08-17 NOTE — Telephone Encounter (Signed)
Dr. Charlett Blake, ? ?I thank you office note regarding patient's visit today.  This is for extreme anxiety recently since her dad passed.  She describes sad situation where he suffered ?

## 2021-08-17 NOTE — Telephone Encounter (Signed)
Pt called and phone number asked for an operator extension , messaged pt via mychart ?

## 2021-08-19 ENCOUNTER — Other Ambulatory Visit: Payer: Self-pay | Admitting: Family Medicine

## 2021-08-21 ENCOUNTER — Other Ambulatory Visit: Payer: Self-pay

## 2021-08-21 ENCOUNTER — Ambulatory Visit: Payer: 59 | Admitting: Physician Assistant

## 2021-08-21 ENCOUNTER — Encounter: Payer: Self-pay | Admitting: Physician Assistant

## 2021-08-21 VITALS — BP 114/86 | HR 78 | Ht 65.0 in | Wt 222.0 lb

## 2021-08-21 DIAGNOSIS — I10 Essential (primary) hypertension: Secondary | ICD-10-CM | POA: Diagnosis not present

## 2021-08-21 DIAGNOSIS — I471 Supraventricular tachycardia: Secondary | ICD-10-CM | POA: Diagnosis not present

## 2021-08-21 DIAGNOSIS — E039 Hypothyroidism, unspecified: Secondary | ICD-10-CM

## 2021-08-21 MED ORDER — AMLODIPINE BESYLATE 5 MG PO TABS: 5.0000 mg | ORAL_TABLET | Freq: Every day | ORAL | 3 refills | Status: DC

## 2021-08-21 NOTE — Progress Notes (Signed)
?Cardiology Office Note:   ? ?Date:  08/23/2021  ? ?ID:  Ebony [name], DOB 11-Jan-1966, MRN [id number] ? ?PCP:  Mosie Lukes, MD ?  ?[facility] Providers ?Cardiologist:  Kirk Ruths, MD ?Electrophysiologist:  Cristopher Peru, MD    ? ?Referring MD: Mosie Lukes, MD  ? ?Chief Complaint  ?Patient presents with  ? Follow-up  ?  Seen for Dr. Stanford Breed  ? ? ?History of Present Illness:   ? ?Ebony [name] is a 56 y.o. female with a hx of AVNRT s/p radiofrequency ablation on 12/17/2018 by Dr. Lovena Le, hypertension, hyperlipidemia, hypothyroidism and obesity.  Echocardiogram in 2014 showed normal EF, grade 2 DD, mild LAE.  Heart monitor in October 2014 showed intermittent SVT.  She had a history of fatigue on metoprolol.  Although there was initial concern of bicuspid aortic valve, however repeat echocardiogram in December 2019 demonstrated trileaflet aortic valve, EF 60 to 65%, trivial mitral valve prolapse involving the anterior leaflet. ? ?Patient presents today for delayed 1 year follow-up, she denies any chest discomfort or worsening dyspnea.  She occasionally has brief palpitation that only last a few seconds at a time, however no prolonged episode.  Diltiazem was stopped by Dr. Lovena Le in August 2020 after the ablation procedure.  Overall, she has been doing well from the cardiac perspective.  I refilled her amlodipine.  She asked about potentially coming off of the blood pressure medication.  For someone of her age, would recommend keeping her systolic blood pressure in the 110s to 120s range.  She may do a trial off of the amlodipine, if after 2 weeks, her systolic blood pressure is 130 or higher, she really should be restarted on amlodipine. ? ? ?Past Medical History:  ?Diagnosis Date  ? Arthritis   ? hips    ? BCC (basal cell carcinoma), face   ? skin   ? Concussion with no loss of consciousness 12/28/2014  ? Depression with anxiety 10/06/2012  ? Diabetes mellitus without complication ([place])   ? no  meds--- reports as borderline , last a1c 01-05-2019 with PCP 6.3%  ? GERD (gastroesophageal reflux disease) 09/05/2015  ? MGD WITH PPI   ? Headache(784.0)   ? otc meds prn, migraine, NOW 1-2 TIMES ANNUALLY   ? Hyperlipidemia, mixed 12/28/2014  ? Hypertension   ? only with preg. 11yr ago 2002- no meds  ? Hypothyroid 06/15/2012  ? Obesity 09/05/2015  ? Post-operative nausea and vomiting   ? SVT (supraventricular tachycardia) ([place]   ? Tinea pedis 12/28/2014  ? ? ?Past Surgical History:  ?Procedure Laterality Date  ? ABDOMINAL HYSTERECTOMY  03/04/2012  ? Procedure: HYSTERECTOMY ABDOMINAL;  Surgeon: GMarylynn Pearson MD;  [facility];  Service: Gynecology;  Laterality: N/A;  with lysis of adhesions  ? BASAL CELL CARCINOMA EXCISION Left   ? cheek/face  ? CESAREAN SECTION  2002  ? HELLP Syndrome/ one time  ? COLONOSCOPY    ? SAME TIME AS ENDO   ? DILATION AND CURETTAGE OF UTERUS  2007  ? Miscarriage  ? LAPAROSCOPIC LYSIS OF ADHESIONS N/A 02/17/2019  ? Procedure: LAPAROSCOPIC LYSIS OF ADHESIONS;  Surgeon: REveritt Amber MD;  [facility];  Service: Gynecology;  Laterality: N/A;  ? LAPAROSCOPIC TUBAL LIGATION  01/03/2011  ? Procedure: LAPAROSCOPIC TUBAL LIGATION;  Surgeon: GMarylynn Pearson  [facility];  Service: Gynecology;  Laterality: Bilateral;  with filshie clips  ? LAPAROSCOPY  03/04/2012  ? Procedure: LAPAROSCOPY DIAGNOSTIC;  Surgeon: GMarylynn Pearson MD;  Location: [facility];  Service: Gynecology;  Laterality: N/A;  with lysis of adhesions  ? ROBOTIC ASSISTED BILATERAL SALPINGO OOPHERECTOMY Bilateral 02/17/2019  ? Procedure: XI ROBOTIC ASSISTED BILATERAL SALPINGO OOPHORECTOMY;  Surgeon: Everitt Amber, MD;  Location[facility];  Service: Gynecology;  Laterality: Bilateral;  ? SHOULDER ARTHROSCOPY  2012  ? right  ? SVT ABLATION N/A 12/17/2018  ? Procedure: SVT ABLATION;  Surgeon: Evans Lance, MD;  Location: [facility];  Service: Cardiovascular;  Laterality: N/A;  ? TONSILLECTOMY    ? UPPER GI ENDOSCOPY     ? ? ?Current Medications: ?Current Meds  ?Medication Sig  ? Ascorbic Acid (VITAMIN C) 1000 MG tablet Take 1,000 mg by mouth every evening.  ? Cholecalciferol (VITAMIN D) 50 MCG (2000 UT) tablet Take 2,000 Units by mouth daily.  ? clonazePAM (KLONOPIN) 0.5 MG tablet Take 1 tablet (0.5 mg total) by mouth 2 (two) times daily as needed for anxiety.  ? clonazePAM (KLONOPIN) 0.5 MG tablet Take 1 tablet (0.5 mg total) by mouth 2 (two) times daily as needed for anxiety.  ? clonazePAM (KLONOPIN) 0.5 MG tablet Take 1 tablet (0.5 mg total) by mouth 2 (two) times daily as needed for anxiety.  ? levothyroxine (SYNTHROID) 75 MCG tablet TAKE 1 TABLET BY MOUTH EVERY DAY BEFORE BREAKFAST  ? pantoprazole (PROTONIX) 40 MG tablet TAKE 1 TABLET BY MOUTH TWICE A DAY  ? venlafaxine XR (EFFEXOR-XR) 75 MG 24 hr capsule TAKE 1 CAPSULE BY MOUTH DAILY WITH BREAKFAST. NEEDS OFFICE VISIT BEFORE ANY FURTHER REFILLS  ? [DISCONTINUED] amLODipine (NORVASC) 5 MG tablet TAKE 1 TABLET BY MOUTH DAILY. NEED TO SCHEDULE ANNUAL APPOINTMENT FOR FUTURE REFILLS. FIRST ATTEMPT.  ?  ? ?Allergies:   Patient has no known allergies.  ? ?Social History  ? ?Socioeconomic History  ? Marital status: Married  ?  Spouse name: Not on file  ? Number of children: 1  ? Years of education: Not on file  ? Highest education level: Not on file  ?Occupational History  ?  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 Smoking status: Never  ? Smokeless tobacco: Never  ?Vaping Use  ? Vaping Use: Never used  ?Substance and Sexual Activity  ? Alcohol use: Yes  ?  Comment: occasionally   ? Drug use: No  ? Sexual activity: Yes  ?  Birth control/protection: Surgical  ?Other Topics Concern  ? Not on file  ?Social History Narrative  ? Not on file  ? ?Social Determinants of Health  ? ?Financial Resource Strain: Not on file  ?Food Insecurity: Not on file  ?Transportation Needs: Not on file  ?Physical Activity: Not on file  ?Stress: Not on file  ?Social Connections: Not on file  ?  ? ?Family  History: ?The patient's family history includes Basal cell carcinoma (age of onset: 36) in her brother; Breast cancer (age of onset: 68) in her cousin; Breast cancer (age of onset: 39) in her mother; Depression in her mother; Emphysema in her maternal grandfather; Hypertension in her mother; Lung cancer in her mother; Other in her maternal grandmother; Prostate cancer in her maternal grandfather. There is no history of Colon cancer, Esophageal cancer, Pancreatic cancer, or Stomach cancer. ? ?ROS:   ?Please see the history of present illness.    ? All other systems reviewed and are negative. ? ?EKGs/Labs/Other Studies Reviewed:   ? ?The following studies were reviewed today: ? ?Echo 05/14/2018 ?LV EF: 60% -   65%  ? ?-------------------------------------------------------------------  ?  Indications:      (I47.1).  ? ?-------------------------------------------------------------------  ?History:   PMH:  SVT. Acquired from the patient and from the  ?patient&'s chart.  Risk factors:  Hypertension. Diabetes mellitus.  ?Obese. Dyslipidemia.  ? ?-------------------------------------------------------------------  ?Study Conclusions  ? ?- Left ventricle: The cavity size was normal. Wall thickness was  ?  increased in a pattern of mild LVH. Systolic function was normal.  ?  The estimated ejection fraction was in the range of 60% to 65%.  ?  Wall motion was normal; there were no regional wall motion  ?  abnormalities. Left ventricular diastolic function parameters  ?  were normal.  ?- Mitral valve: Trivial, late systolicprolapse, involving the  ?  anterior leaflet.  ?- Right ventricle: Systolic function was normal.  ?- Atrial septum: No defect or patent foramen ovale was identified.  ? ?Impressions:  ? ?- Normal LV EF. Minor prolapse of anterior mitral valve leaflet  ?  without regurgitation.  ? ?EKG:  EKG is ordered today.  The ekg ordered today demonstrates normal sinus rhythm, no significant ST-T wave changes ? ?Recent  Labs: ?No results found for requested labs within last 8760 hours.  ?Recent Lipid Panel ?   ?Component Value Date/Time  ? CHOL 176 01/14/2020 1412  ? TRIG 139.0 01/14/2020 1412  ? HDL 48.20 01/14/2020 1412  ? CHOLHDL 4 08/12/202

## 2021-08-21 NOTE — Patient Instructions (Signed)
Medication Instructions:  ?Your physician recommends that you continue on your current medications as directed. Please refer to the Current Medication list given to you today. ? ?*If you need a refill on your cardiac medications before your next appointment, please call your pharmacy* ? ?Lab Work: ?NONE ordered at this time of appointment  ? ?If you have labs (blood work) drawn today and your tests are completely normal, you will receive your results only by: ?MyChart Message (if you have MyChart) OR ?A paper copy in the mail ?If you have any lab test that is abnormal or we need to change your treatment, we will call you to review the results. ? ?Testing/Procedures: ?NONE ordered at this time of appointment  ? ?Follow-Up: ?At [facility], you and your health needs are our priority.  As part of our continuing mission to provide you with exceptional heart care, we have created designated Provider Care Teams.  These Care Teams include your primary Cardiologist (physician) and Advanced Practice Providers (APPs -  Physician Assistants and Nurse Practitioners) who all work together to provide you with the care you need, when you need it. ? ?Your next appointment:   ?1 year(s) ? ?The format for your next appointment:   ?In Person ? ?Provider:   ?Kirk Ruths, MD   ? ?Other Instructions ? ? ?

## 2021-08-23 ENCOUNTER — Encounter: Payer: Self-pay | Admitting: Physician Assistant

## 2021-08-23 ENCOUNTER — Other Ambulatory Visit: Payer: 59

## 2021-08-23 DIAGNOSIS — F418 Other specified anxiety disorders: Secondary | ICD-10-CM

## 2021-08-23 DIAGNOSIS — Z79899 Other long term (current) drug therapy: Secondary | ICD-10-CM

## 2021-08-25 LAB — DRUG MONITORING PANEL 376104, URINE
Alphahydroxyalprazolam: NEGATIVE ng/mL (ref <25)
Alphahydroxymidazolam: NEGATIVE ng/mL (ref <50)
Alphahydroxytriazolam: NEGATIVE ng/mL (ref <50)
Aminoclonazepam: 378 ng/mL — ABNORMAL HIGH (ref <25)
Amphetamines: NEGATIVE ng/mL (ref <500)
Barbiturates: NEGATIVE ng/mL (ref <300)
Benzodiazepines: POSITIVE ng/mL — AB (ref <100)
Cocaine Metabolite: NEGATIVE ng/mL (ref <150)
Desmethyltramadol: NEGATIVE ng/mL (ref <100)
Hydroxyethylflurazepam: NEGATIVE ng/mL (ref <50)
Lorazepam: NEGATIVE ng/mL (ref <50)
Nordiazepam: NEGATIVE ng/mL (ref <50)
Opiates: NEGATIVE ng/mL (ref <100)
Oxazepam: NEGATIVE ng/mL (ref <50)
Oxycodone: NEGATIVE ng/mL (ref <100)
Temazepam: NEGATIVE ng/mL (ref <50)
Tramadol: NEGATIVE ng/mL (ref <100)

## 2021-08-25 LAB — DM TEMPLATE

## 2021-08-26 ENCOUNTER — Other Ambulatory Visit: Payer: Self-pay | Admitting: Family Medicine

## 2021-08-26 MED ORDER — CLONAZEPAM 0.5 MG PO TABS: 0.5000 mg | ORAL_TABLET | Freq: Every day | ORAL | 3 refills | Status: DC | PRN

## 2021-10-13 ENCOUNTER — Encounter: Payer: Self-pay | Admitting: Family Medicine

## 2021-10-13 ENCOUNTER — Other Ambulatory Visit: Payer: Self-pay | Admitting: Family Medicine

## 2021-10-13 MED ORDER — CLONAZEPAM 0.5 MG PO TABS: 0.5000 mg | ORAL_TABLET | Freq: Two times a day (BID) | ORAL | 3 refills | Status: DC | PRN

## 2021-10-25 LAB — HM MAMMOGRAPHY

## 2021-10-26 LAB — HM PAP SMEAR: HPV, high-risk: NEGATIVE

## 2021-11-08 ENCOUNTER — Encounter: Payer: Self-pay | Admitting: Gastroenterology

## 2021-11-15 ENCOUNTER — Other Ambulatory Visit: Payer: Self-pay | Admitting: Family Medicine

## 2021-11-15 ENCOUNTER — Other Ambulatory Visit: Payer: 59

## 2021-11-20 ENCOUNTER — Ambulatory Visit: Payer: 59 | Admitting: Family Medicine

## 2021-12-04 ENCOUNTER — Other Ambulatory Visit (INDEPENDENT_AMBULATORY_CARE_PROVIDER_SITE_OTHER): Payer: 59

## 2021-12-04 ENCOUNTER — Telehealth: Payer: Self-pay | Admitting: *Deleted

## 2021-12-04 ENCOUNTER — Ambulatory Visit (AMBULATORY_SURGERY_CENTER): Payer: Self-pay | Admitting: *Deleted

## 2021-12-04 VITALS — Ht 65.0 in | Wt 206.8 lb

## 2021-12-04 DIAGNOSIS — E119 Type 2 diabetes mellitus without complications: Secondary | ICD-10-CM

## 2021-12-04 DIAGNOSIS — E039 Hypothyroidism, unspecified: Secondary | ICD-10-CM

## 2021-12-04 DIAGNOSIS — E782 Mixed hyperlipidemia: Secondary | ICD-10-CM | POA: Diagnosis not present

## 2021-12-04 DIAGNOSIS — I10 Essential (primary) hypertension: Secondary | ICD-10-CM

## 2021-12-04 DIAGNOSIS — Z86010 Personal history of colonic polyps: Secondary | ICD-10-CM

## 2021-12-04 LAB — CBC
HCT: 38.8 % (ref 36.0–46.0)
Hemoglobin: 13.1 g/dL (ref 12.0–15.0)
MCHC: 33.7 g/dL (ref 30.0–36.0)
MCV: 79.4 fl (ref 78.0–100.0)
Platelets: 307 10*3/uL (ref 150.0–400.0)
RBC: 4.89 Mil/uL (ref 3.87–5.11)
RDW: 15.3 % (ref 11.5–15.5)
WBC: 8.7 10*3/uL (ref 4.0–10.5)

## 2021-12-04 LAB — COMPREHENSIVE METABOLIC PANEL
ALT: 25 U/L (ref 0–35)
AST: 25 U/L (ref 0–37)
Albumin: 4.5 g/dL (ref 3.5–5.2)
Alkaline Phosphatase: 74 U/L (ref 39–117)
BUN: 21 mg/dL (ref 6–23)
CO2: 25 mEq/L (ref 19–32)
Calcium: 9.8 mg/dL (ref 8.4–10.5)
Chloride: 102 mEq/L (ref 96–112)
Creatinine, Ser: 0.77 mg/dL (ref 0.40–1.20)
GFR: 87.8 mL/min (ref >60.00)
Glucose, Bld: 91 mg/dL (ref 70–99)
Potassium: 4.1 mEq/L (ref 3.5–5.1)
Sodium: 137 mEq/L (ref 135–145)
Total Bilirubin: 0.4 mg/dL (ref 0.2–1.2)
Total Protein: 7.4 g/dL (ref 6.0–8.3)

## 2021-12-04 LAB — LIPID PANEL
Cholesterol: 149 mg/dL (ref 0–200)
HDL: 41.4 mg/dL (ref >39.00)
LDL Cholesterol: 81 mg/dL (ref 0–99)
NonHDL: 107.38
Total CHOL/HDL Ratio: 4
Triglycerides: 131 mg/dL (ref 0.0–149.0)
VLDL: 26.2 mg/dL (ref 0.0–40.0)

## 2021-12-04 LAB — HEMOGLOBIN A1C: Hgb A1c MFr Bld: 5.9 % (ref 4.6–6.5)

## 2021-12-04 LAB — TSH: TSH: 2.66 u[IU]/mL (ref 0.35–5.50)

## 2021-12-04 MED ORDER — NA SULFATE-K SULFATE-MG SULF 17.5-3.13-1.6 GM/177ML PO SOLN: 1.0000 | Freq: Once | ORAL | 0 refills | Status: AC

## 2021-12-04 NOTE — Progress Notes (Signed)
No egg or soy allergy known to patient  No issues known to pt with past sedation with any surgeries or procedures Patient denies ever being told they had issues or difficulty with intubation  No FH of Malignant Hyperthermia Pt is not on diet pills Pt is not on  home 02  Pt is not on blood thinners  Pt denies issues with constipation  No A fib or A flutter Have any cardiac testing pending--  SUPREP Coupon to pt in PV today , Code to Pharmacy and  NO PA's for preps discussed with pt In PV today  Discussed with pt there will be an out-of-pocket cost for prep and that varies from $0 to 70 +  dollars - pt verbalized understanding  Pt instructed to use Singlecare.com or GoodRx for a price reduction on prep

## 2021-12-04 NOTE — Telephone Encounter (Signed)
General labs and A1C ordered. Thanks!

## 2021-12-04 NOTE — Telephone Encounter (Signed)
Patient has an appointment on 12/07/21 for follow up.  She has not been in for a while. She came in to have blood work drawn but we need orders.  What would you like to order for her?  Blood was drawn already for general labs.

## 2021-12-07 ENCOUNTER — Ambulatory Visit: Payer: 59 | Admitting: Family Medicine

## 2021-12-07 DIAGNOSIS — E119 Type 2 diabetes mellitus without complications: Secondary | ICD-10-CM | POA: Diagnosis not present

## 2021-12-07 DIAGNOSIS — M542 Cervicalgia: Secondary | ICD-10-CM | POA: Diagnosis not present

## 2021-12-07 DIAGNOSIS — E039 Hypothyroidism, unspecified: Secondary | ICD-10-CM | POA: Diagnosis not present

## 2021-12-07 DIAGNOSIS — E782 Mixed hyperlipidemia: Secondary | ICD-10-CM

## 2021-12-07 DIAGNOSIS — Z6837 Body mass index (BMI) 37.0-37.9, adult: Secondary | ICD-10-CM

## 2021-12-07 DIAGNOSIS — I10 Essential (primary) hypertension: Secondary | ICD-10-CM

## 2021-12-07 DIAGNOSIS — F418 Other specified anxiety disorders: Secondary | ICD-10-CM

## 2021-12-07 DIAGNOSIS — E6609 Other obesity due to excess calories: Secondary | ICD-10-CM

## 2021-12-07 MED ORDER — TIZANIDINE HCL 2 MG PO TABS: 1.0000 mg | ORAL_TABLET | Freq: Every evening | ORAL | 1 refills | Status: DC | PRN

## 2021-12-07 NOTE — Progress Notes (Signed)
Subjective:    Patient ID: Ebony [name], female    DOB: 05/03/1966, 56 y.o.   MRN: [id number]  No chief complaint on file.   HPI Patient is in today for a follow up on chronic medical concerns. No recent febrile illness or acute concerns. She is tolerating Mounjaro and has had some success with weight loss. Her hgba1c has improved as well. No complaints of polyuria or polydipsia. Denies CP/palp/SOB/HA/congestion/fevers/GI or GU c/o. Taking meds as prescribed. Her son is doing well and that stress is improved as he has recovered after his brain surgeries.   Past Medical History:  Diagnosis Date   Arthritis    hips     BCC (basal cell carcinoma), face    skin    Concussion with no loss of consciousness 12/28/2014   Depression with anxiety 10/06/2012   Diabetes mellitus without complication ([place])    no meds--- reports as borderline , last a1c 01-05-2019 with PCP 6.3%   GERD (gastroesophageal reflux disease) 09/05/2015   MGD WITH PPI    Headache(784.0)    otc meds prn, migraine, NOW 1-2 TIMES ANNUALLY    Hyperlipidemia, mixed 12/28/2014   NOT ON ANY MEDICATIONS UPDATED 12/04/21   Hypertension    only with preg. 58yr ago 2002- no meds   Hypothyroid 06/15/2012   Obesity 09/05/2015   Post-operative nausea and vomiting    SVT (supraventricular tachycardia) ([place]    Tinea pedis 12/28/2014    Past Surgical History:  Procedure Laterality Date   ABDOMINAL HYSTERECTOMY  03/04/2012   Procedure: HYSTERECTOMY ABDOMINAL;  Surgeon: GMarylynn Pearson MD;  [facility];  Service: Gynecology;  Laterality: N/A;  with lysis of adhesions   BASAL CELL CARCINOMA EXCISION Left    cheek/face   CESAREAN SECTION  2002   HELLP Syndrome/ one time   COLONOSCOPY     SAME TIME AS ENDO    DILATION AND CURETTAGE OF UTERUS  2007   Miscarriage   HAND SURGERY Right    RING FINGER BROKEN 09/2021   LAPAROSCOPIC LYSIS OF ADHESIONS N/A 02/17/2019   Procedure: LAPAROSCOPIC LYSIS OF ADHESIONS;  Surgeon:  REveritt Amber MD;  [facility];  Service: Gynecology;  Laterality: N/A;   LAPAROSCOPIC TUBAL LIGATION  01/03/2011   Procedure: LAPAROSCOPIC TUBAL LIGATION;  Surgeon: GMarylynn Pearson  [facility];  Service: Gynecology;  Laterality: Bilateral;  with filshie clips   LAPAROSCOPY  03/04/2012   Procedure: LAPAROSCOPY DIAGNOSTIC;  Surgeon: GMarylynn Pearson MD;  [facility];  Service: Gynecology;  Laterality: N/A;  with lysis of adhesions   POLYPECTOMY     ROBOTIC ASSISTED BILATERAL SALPINGO OOPHERECTOMY Bilateral 02/17/2019   Procedure: XI ROBOTIC ASSISTED BILATERAL SALPINGO OOPHORECTOMY;  Surgeon: REveritt Amber MD;  Location: [facility];  Service: Gynecology;  Laterality: Bilateral;   SHOULDER ARTHROSCOPY  2012   right   SVT ABLATION N/A 12/17/2018   Procedure: SVT ABLATION;  Surgeon: TEvans Lance MD;  [facility];  Service: Cardiovascular;  Laterality: N/A;   TONSILLECTOMY     TOTAL HIP ARTHROPLASTY Right    FEB 2021   UPPER GI ENDOSCOPY      Family History  Problem Relation Age of Onset   Hypertension Mother    Depression Mother    Breast cancer Mother 557  Lung cancer Mother        lung (06-13-11)   Basal cell carcinoma Brother 433  Other Maternal Grandmother  spinal stenosis   Emphysema Maternal Grandfather    Prostate cancer Maternal Grandfather    Breast cancer Cousin 39   Colon cancer Neg Hx    Esophageal cancer Neg Hx    Pancreatic cancer Neg Hx    Stomach cancer Neg Hx    Colon polyps Neg Hx    Crohn's disease Neg Hx    Rectal cancer Neg Hx     Social History   Socioeconomic History   Marital status: Married    Spouse name: Not on file   Number of children: 1   Years of education: Not on file   Highest education level: Not on file  Occupational History    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  Tobacco Use   Smoking status: Never    Passive exposure: Past (BOTH PARENTS SMOKED)   Smokeless tobacco: Never  Vaping Use   Vaping Use: Never used   Substance and Sexual Activity   Alcohol use: Yes    Comment: occasionally    Drug use: No   Sexual activity: Yes    Birth control/protection: Surgical  Other Topics Concern   Not on file  Social History Narrative   Not on file   Social Determinants of Health   Financial Resource Strain: Not on file  Food Insecurity: Not on file  Transportation Needs: Not on file  Physical Activity: Not on file  Stress: Not on file  Social Connections: Not on file  Intimate Partner Violence: Not on file    Outpatient Medications Prior to Visit  Medication Sig Dispense Refill   amLODipine (NORVASC) 5 MG tablet Take 1 tablet (5 mg total) by mouth daily. 90 tablet 3   Ascorbic Acid (VITAMIN C) 1000 MG tablet Take 1,000 mg by mouth every evening.     Cholecalciferol (VITAMIN D) 50 MCG (2000 UT) tablet Take 2,000 Units by mouth daily.     clonazePAM (KLONOPIN) 0.5 MG tablet Take 1 tablet (0.5 mg total) by mouth 2 (two) times daily as needed. 60 tablet 3   levothyroxine (SYNTHROID) 75 MCG tablet TAKE 1 TABLET BY MOUTH EVERY DAY BEFORE BREAKFAST 90 tablet 3   metFORMIN (GLUCOPHAGE) 500 MG tablet Take 500 mg by mouth at bedtime.     ondansetron (ZOFRAN-ODT) 4 MG disintegrating tablet TAKE 1 TABLET BY MOUTH EVERY 8 HOURS AS NEEDED FOR NAUSEA AND VOMITING     pantoprazole (PROTONIX) 40 MG tablet TAKE 1 TABLET BY MOUTH TWICE A DAY 180 tablet 5   pyridOXINE (VITAMIN B-6) 100 MG tablet Take 100 mg by mouth in the morning and at bedtime.     tirzepatide [facility]) 5 MG/0.5ML Pen Inject 5 mg into the skin once a week.     venlafaxine XR (EFFEXOR-XR) 75 MG 24 hr capsule Take 1 capsule (75 mg total) by mouth daily with breakfast. 90 capsule 0   No facility-administered medications prior to visit.    No Known Allergies  Review of Systems  Constitutional:  Negative for fever and malaise/fatigue.  HENT:  Negative for congestion.   Eyes:  Negative for blurred vision.  Respiratory:  Negative for shortness of  breath.   Cardiovascular:  Negative for chest pain, palpitations and leg swelling.  Gastrointestinal:  Negative for abdominal pain, blood in stool and nausea.  Genitourinary:  Negative for dysuria and frequency.  Musculoskeletal:  Negative for falls.  Skin:  Negative for rash.  Neurological:  Negative for dizziness, loss of consciousness and headaches.  Endo/Heme/Allergies:  Negative for environmental allergies.  Psychiatric/Behavioral:  Negative for depression. The patient is not nervous/anxious.        Objective:    Physical Exam Constitutional:      General: She is not in acute distress.    Appearance: She is well-developed.  HENT:     Head: Normocephalic and atraumatic.  Eyes:     Conjunctiva/sclera: Conjunctivae normal.  Neck:     Thyroid: No thyromegaly.  Cardiovascular:     Rate and Rhythm: Normal rate and regular rhythm.     Heart sounds: Normal heart sounds. No murmur heard. Pulmonary:     Effort: Pulmonary effort is normal. No respiratory distress.     Breath sounds: Normal breath sounds.  Abdominal:     General: Bowel sounds are normal. There is no distension.     Palpations: Abdomen is soft. There is no mass.     Tenderness: There is no abdominal tenderness.  Musculoskeletal:     Cervical back: Neck supple.  Lymphadenopathy:     Cervical: No cervical adenopathy.  Skin:    General: Skin is warm and dry.  Neurological:     Mental Status: She is alert and oriented to person, place, and time.  Psychiatric:        Behavior: Behavior normal.     LMP 12/15/2010  Wt Readings from Last 3 Encounters:  12/04/21 206 lb 12.8 oz (93.8 kg)  08/21/21 222 lb (100.7 kg)  08/09/21 217 lb (98.4 kg)    Diabetic Foot Exam - Simple   No data filed    Lab Results  Component Value Date   WBC 8.7 12/04/2021   HGB 13.1 12/04/2021   HCT 38.8 12/04/2021   PLT 307.0 12/04/2021   GLUCOSE 91 12/04/2021   CHOL 149 12/04/2021   TRIG 131.0 12/04/2021   HDL 41.40 12/04/2021    LDLCALC 81 12/04/2021   ALT 25 12/04/2021   AST 25 12/04/2021   NA 137 12/04/2021   K 4.1 12/04/2021   CL 102 12/04/2021   CREATININE 0.77 12/04/2021   BUN 21 12/04/2021   CO2 25 12/04/2021   TSH 2.66 12/04/2021   HGBA1C 5.9 12/04/2021   MICROALBUR 1.8 03/22/2016    Lab Results  Component Value Date   TSH 2.66 12/04/2021   Lab Results  Component Value Date   WBC 8.7 12/04/2021   HGB 13.1 12/04/2021   HCT 38.8 12/04/2021   MCV 79.4 12/04/2021   PLT 307.0 12/04/2021   Lab Results  Component Value Date   NA 137 12/04/2021   K 4.1 12/04/2021   CO2 25 12/04/2021   GLUCOSE 91 12/04/2021   BUN 21 12/04/2021   CREATININE 0.77 12/04/2021   BILITOT 0.4 12/04/2021   ALKPHOS 74 12/04/2021   AST 25 12/04/2021   ALT 25 12/04/2021   PROT 7.4 12/04/2021   ALBUMIN 4.5 12/04/2021   CALCIUM 9.8 12/04/2021   ANIONGAP 12 07/12/2020   GFR 87.80 12/04/2021   Lab Results  Component Value Date   CHOL 149 12/04/2021   Lab Results  Component Value Date   HDL 41.40 12/04/2021   Lab Results  Component Value Date   LDLCALC 81 12/04/2021   Lab Results  Component Value Date   TRIG 131.0 12/04/2021   Lab Results  Component Value Date   CHOLHDL 4 12/04/2021   Lab Results  Component Value Date   HGBA1C 5.9 12/04/2021       Assessment & Plan:      Problem List Items Addressed This Visit  Hypothyroid    On Levothyroxine, continue to monitor      Depression with anxiety    Doing well on Venlafaxine and uses Clonazepam infrequently but with her ongoing stress of her son's brain tumor. He is doing well but we will keep the medication in her chart in case she needs it for any reason.       Neck pain - Primary   Relevant Orders   DG Cervical Spine Complete   Hyperlipidemia, mixed    Tolerating statin, encouraged heart healthy diet, avoid trans fats, minimize simple carbs and saturated fats. Increase exercise as tolerated      Type 2 diabetes mellitus without  complication, without long-term current use of insulin (HCC)    hgba1c acceptable, minimize simple carbs. Increase exercise as tolerated. Continue current meds      Obesity    Encouraged DASH or MIND diet, decrease po intake and increase exercise as tolerated. Needs 7-8 hours of sleep nightly. Avoid trans fats, eat small, frequent meals every 4-5 hours with lean proteins, complex carbs and healthy fats. Minimize simple carbs, high fat foods and processed foods. Is tolerating Mounjaro and having good success with weight loss.       Essential hypertension    Well controlled, no changes to meds. Encouraged heart healthy diet such as the DASH diet and exercise as tolerated.        I am having Shirlee More Maloof "Kathie" start on tiZANidine. I am also having her maintain her vitamin C, Vitamin D, pantoprazole, levothyroxine, amLODipine, clonazePAM, venlafaxine XR, metFORMIN, ondansetron, Mounjaro, and pyridOXINE.  Meds ordered this encounter  Medications   tiZANidine (ZANAFLEX) 2 MG tablet    Sig: Take 0.5-2 tablets (1-4 mg total) by mouth at bedtime as needed for muscle spasms.    Dispense:  40 tablet    Refill:  1

## 2021-12-07 NOTE — Patient Instructions (Signed)
Hypertension, Adult High blood pressure (hypertension) is when the force of blood pumping through the arteries is too strong. The arteries are the blood vessels that carry blood from the heart throughout the body. Hypertension forces the heart to work harder to pump blood and may cause arteries to become narrow or stiff. Untreated or uncontrolled hypertension can lead to a heart attack, heart failure, a stroke, kidney disease, and other problems. A blood pressure reading consists of a higher number over a lower number. Ideally, your blood pressure should be below 120/80. The first ("top") number is called the systolic pressure. It is a measure of the pressure in your arteries as your heart beats. The second ("bottom") number is called the diastolic pressure. It is a measure of the pressure in your arteries as the heart relaxes. What are the causes? The exact cause of this condition is not known. There are some conditions that result in high blood pressure. What increases the risk? Certain factors may make you more likely to develop high blood pressure. Some of these risk factors are under your control, including: Smoking. Not getting enough exercise or physical activity. Being overweight. Having too much fat, sugar, calories, or salt (sodium) in your diet. Drinking too much alcohol. Other risk factors include: Having a personal history of heart disease, diabetes, high cholesterol, or kidney disease. Stress. Having a family history of high blood pressure and high cholesterol. Having obstructive sleep apnea. Age. The risk increases with age. What are the signs or symptoms? High blood pressure may not cause symptoms. Very high blood pressure (hypertensive crisis) may cause: Headache. Fast or irregular heartbeats (palpitations). Shortness of breath. Nosebleed. Nausea and vomiting. Vision changes. Severe chest pain, dizziness, and seizures. How is this diagnosed? This condition is diagnosed by  measuring your blood pressure while you are seated, with your arm resting on a flat surface, your legs uncrossed, and your feet flat on the floor. The cuff of the blood pressure monitor will be placed directly against the skin of your upper arm at the level of your heart. Blood pressure should be measured at least twice using the same arm. Certain conditions can cause a difference in blood pressure between your right and left arms. If you have a high blood pressure reading during one visit or you have normal blood pressure with other risk factors, you may be asked to: Return on a different day to have your blood pressure checked again. Monitor your blood pressure at home for 1 week or longer. If you are diagnosed with hypertension, you may have other blood or imaging tests to help your health care provider understand your overall risk for other conditions. How is this treated? This condition is treated by making healthy lifestyle changes, such as eating healthy foods, exercising more, and reducing your alcohol intake. You may be referred for counseling on a healthy diet and physical activity. Your health care provider may prescribe medicine if lifestyle changes are not enough to get your blood pressure under control and if: Your systolic blood pressure is above 130. Your diastolic blood pressure is above 80. Your personal target blood pressure may vary depending on your medical conditions, your age, and other factors. Follow these instructions at home: Eating and drinking  Eat a diet that is high in fiber and potassium, and low in sodium, added sugar, and fat. An example of this eating plan is called the DASH diet. DASH stands for Dietary Approaches to Stop Hypertension. To eat this way: Eat   plenty of fresh fruits and vegetables. Try to fill one half of your plate at each meal with fruits and vegetables. Eat whole grains, such as whole-wheat pasta, brown rice, or whole-grain bread. Fill about one  fourth of your plate with whole grains. Eat or drink low-fat dairy products, such as skim milk or low-fat yogurt. Avoid fatty cuts of meat, processed or cured meats, and poultry with skin. Fill about one fourth of your plate with lean proteins, such as fish, chicken without skin, beans, eggs, or tofu. Avoid pre-made and processed foods. These tend to be higher in sodium, added sugar, and fat. Reduce your daily sodium intake. Many people with hypertension should eat less than 1,500 mg of sodium a day. Do not drink alcohol if: Your health care provider tells you not to drink. You are pregnant, may be pregnant, or are planning to become pregnant. If you drink alcohol: Limit how much you have to: 0-1 drink a day for women. 0-2 drinks a day for men. Know how much alcohol is in your drink. In the [place] one drink equals one 12 oz bottle of beer (355 mL), one 5 oz glass of wine (148 mL), or one 1 oz glass of hard liquor (44 mL). Lifestyle  Work with your health care provider to maintain a healthy body weight or to lose weight. Ask what an ideal weight is for you. Get at least 30 minutes of exercise that causes your heart to beat faster (aerobic exercise) most days of the week. Activities may include walking, swimming, or biking. Include exercise to strengthen your muscles (resistance exercise), such as Pilates or lifting weights, as part of your weekly exercise routine. Try to do these types of exercises for 30 minutes at least 3 days a week. Do not use any products that contain nicotine or tobacco. These products include cigarettes, chewing tobacco, and vaping devices, such as e-cigarettes. If you need help quitting, ask your health care provider. Monitor your blood pressure at home as told by your health care provider. Keep all follow-up visits. This is important. Medicines Take over-the-counter and prescription medicines only as told by your health care provider. Follow directions carefully. Blood  pressure medicines must be taken as prescribed. Do not skip doses of blood pressure medicine. Doing this puts you at risk for problems and can make the medicine less effective. Ask your health care provider about side effects or reactions to medicines that you should watch for. Contact a health care provider if you: Think you are having a reaction to a medicine you are taking. Have headaches that keep coming back (recurring). Feel dizzy. Have swelling in your ankles. Have trouble with your vision. Get help right away if you: Develop a severe headache or confusion. Have unusual weakness or numbness. Feel faint. Have severe pain in your chest or abdomen. Vomit repeatedly. Have trouble breathing. These symptoms may be an emergency. Get help right away. Call 911. Do not wait to see if the symptoms will go away. Do not drive yourself to the hospital. Summary Hypertension is when the force of blood pumping through your arteries is too strong. If this condition is not controlled, it may put you at risk for serious complications. Your personal target blood pressure may vary depending on your medical conditions, your age, and other factors. For most people, a normal blood pressure is less than 120/80. Hypertension is treated with lifestyle changes, medicines, or a combination of both. Lifestyle changes include losing weight, eating a healthy,   low-sodium diet, exercising more, and limiting alcohol. This information is not intended to replace advice given to you by your health care provider. Make sure you discuss any questions you have with your health care provider. Document Revised: 03/28/2021 Document Reviewed: 03/28/2021 Elsevier Patient Education  2023 [employer]  

## 2021-12-08 NOTE — Assessment & Plan Note (Signed)
Encouraged DASH or MIND diet, decrease po intake and increase exercise as tolerated. Needs 7-8 hours of sleep nightly. Avoid trans fats, eat small, frequent meals every 4-5 hours with lean proteins, complex carbs and healthy fats. Minimize simple carbs, high fat foods and processed foods. Is tolerating Mounjaro and having good success with weight loss.

## 2021-12-08 NOTE — Assessment & Plan Note (Signed)
Well controlled, no changes to meds. Encouraged heart healthy diet such as the DASH diet and exercise as tolerated.  °

## 2021-12-08 NOTE — Assessment & Plan Note (Signed)
On Levothyroxine, continue to monitor 

## 2021-12-08 NOTE — Assessment & Plan Note (Signed)
hgba1c acceptable, minimize simple carbs. Increase exercise as tolerated. Continue current meds 

## 2021-12-08 NOTE — Assessment & Plan Note (Signed)
Tolerating statin, encouraged heart healthy diet, avoid trans fats, minimize simple carbs and saturated fats. Increase exercise as tolerated 

## 2021-12-08 NOTE — Assessment & Plan Note (Signed)
Doing well on Venlafaxine and uses Clonazepam infrequently but with her ongoing stress of her son's brain tumor. He is doing well but we will keep the medication in her chart in case she needs it for any reason.

## 2021-12-26 ENCOUNTER — Ambulatory Visit (HOSPITAL_BASED_OUTPATIENT_CLINIC_OR_DEPARTMENT_OTHER)
Admission: RE | Admit: 2021-12-26 | Discharge: 2021-12-26 | Disposition: A | Discharge status: Home / Self Care | Payer: 59 | Source: Ambulatory Visit | Attending: Family Medicine | Admitting: Family Medicine

## 2021-12-26 ENCOUNTER — Encounter: Payer: Self-pay | Admitting: Family Medicine

## 2021-12-26 DIAGNOSIS — M542 Cervicalgia: Secondary | ICD-10-CM | POA: Diagnosis present

## 2021-12-28 ENCOUNTER — Other Ambulatory Visit: Payer: Self-pay | Admitting: Family Medicine

## 2021-12-28 DIAGNOSIS — M4802 Spinal stenosis, cervical region: Secondary | ICD-10-CM

## 2021-12-28 DIAGNOSIS — M542 Cervicalgia: Secondary | ICD-10-CM

## 2021-12-30 ENCOUNTER — Ambulatory Visit (HOSPITAL_BASED_OUTPATIENT_CLINIC_OR_DEPARTMENT_OTHER)
Admission: RE | Admit: 2021-12-30 | Discharge: 2021-12-30 | Disposition: A | Discharge status: Home / Self Care | Payer: 59 | Source: Ambulatory Visit | Attending: Family Medicine | Admitting: Family Medicine

## 2021-12-30 DIAGNOSIS — M542 Cervicalgia: Secondary | ICD-10-CM | POA: Diagnosis present

## 2021-12-30 DIAGNOSIS — M4802 Spinal stenosis, cervical region: Secondary | ICD-10-CM | POA: Insufficient documentation

## 2022-01-01 ENCOUNTER — Other Ambulatory Visit: Payer: Self-pay

## 2022-01-01 ENCOUNTER — Encounter: Payer: Self-pay | Admitting: Gastroenterology

## 2022-01-01 DIAGNOSIS — M4802 Spinal stenosis, cervical region: Secondary | ICD-10-CM

## 2022-01-01 DIAGNOSIS — M542 Cervicalgia: Secondary | ICD-10-CM

## 2022-01-04 ENCOUNTER — Ambulatory Visit (AMBULATORY_SURGERY_CENTER): Payer: 59 | Admitting: Gastroenterology

## 2022-01-04 ENCOUNTER — Encounter: Payer: Self-pay | Admitting: Gastroenterology

## 2022-01-04 VITALS — BP 99/63 | HR 77 | Temp 98.4°F | Resp 11 | Ht 65.0 in | Wt 206.8 lb

## 2022-01-04 DIAGNOSIS — Z86010 Personal history of colonic polyps: Secondary | ICD-10-CM | POA: Diagnosis not present

## 2022-01-04 DIAGNOSIS — D122 Benign neoplasm of ascending colon: Secondary | ICD-10-CM | POA: Diagnosis not present

## 2022-01-04 DIAGNOSIS — Z09 Encounter for follow-up examination after completed treatment for conditions other than malignant neoplasm: Secondary | ICD-10-CM | POA: Diagnosis present

## 2022-01-04 MED ORDER — SODIUM CHLORIDE 0.9 % IV SOLN: 500.0000 mL | Freq: Once | INTRAVENOUS | Status: DC

## 2022-01-04 NOTE — Op Note (Signed)
[facility] Patient Name: Ebony [name] Procedure Date: 01/04/2022 7:24 AM MRN: [id number] Endoscopist: Gerrit Heck , MD Age: 56 Referring MD:  Date of Birth: 19-Dec-1965 Gender: Female Account #: [id number] Procedure:                Colonoscopy Indications:              Surveillance: Personal history of adenomatous                            polyps on last colonoscopy 3 years ago                           Colonoscopy (11/2018): 4 tubular adenoma polyps,                            largest 10 mm. Tortuous colon. Repeat in 3 years                            for ongoing surveillance. Medicines:                Monitored Anesthesia Care Procedure:                Pre-Anesthesia Assessment:                           - Prior to the procedure, a History and Physical                            was performed, and patient medications and                            allergies were reviewed. The patient's tolerance of                            previous anesthesia was also reviewed. The risks                            and benefits of the procedure and the sedation                            options and risks were discussed with the patient.                            All questions were answered, and informed consent                            was obtained. Prior Anticoagulants: The patient has                            taken no previous anticoagulant or antiplatelet                            agents. ASA Grade Assessment: II - A patient with  mild systemic disease. After reviewing the risks                            and benefits, the patient was deemed in                            satisfactory condition to undergo the procedure.                           After obtaining informed consent, the colonoscope                            was passed under direct vision. Throughout the                            procedure, the patient's blood pressure, pulse, and                             oxygen saturations were monitored continuously. The                            CF [id number] #[id number] was introduced through the anus                            and advanced to the the cecum, identified by                            appendiceal orifice and ileocecal valve. The                            colonoscopy was performed without difficulty. The                            patient tolerated the procedure well. The quality                            of the bowel preparation was good. The ileocecal                            valve, appendiceal orifice, and rectum were                            photographed. Scope In: 8:04:12 AM Scope Out: 8:25:08 AM Scope Withdrawal Time: 0 hours 13 minutes 29 seconds  Total Procedure Duration: 0 hours 20 minutes 56 seconds  Findings:                 The perianal and digital rectal examinations were                            normal.                           A 2 mm polyp was found in the proximal ascending  colon, located on the distal side of the ileocecal                            valve. The polyp was sessile. The polyp was removed                            with a cold biopsy forceps. Resection and retrieval                            were complete. Estimated blood loss was minimal.                           The sigmoid colon was moderately tortuous.                           The exam was otherwise normal throughout the                            remainder of the colon.                           The retroflexed view of the distal rectum and anal                            verge was normal and showed no anal or rectal                            abnormalities. Complications:            No immediate complications. Estimated Blood Loss:     Estimated blood loss was minimal. Impression:               - One 2 mm polyp in the proximal ascending colon,                            removed with a cold  biopsy forceps. Resected and                            retrieved.                           - Tortuous colon.                           - The distal rectum and anal verge are normal on                            retroflexion view. Recommendation:           - Patient has a contact number available for                            emergencies. The signs and symptoms of potential                            delayed  complications were discussed with the                            patient. Return to normal activities tomorrow.                            Written discharge instructions were provided to the                            patient.                           - Resume previous diet.                           - Continue present medications.                           - Await pathology results.                           - Repeat colonoscopy in 5 years for surveillance,                            or potentially sooner based on pathology results.                           - Return to GI office PRN. Gerrit Heck, MD 01/04/2022 8:30:33 AM

## 2022-01-04 NOTE — Progress Notes (Signed)
Vs by CW in adn

## 2022-01-04 NOTE — Progress Notes (Signed)
Sedate, gd SR, tolerated procedure well, VSS, report to RN 

## 2022-01-04 NOTE — Patient Instructions (Signed)
Discharge instructions given. Handout on polyps. Resume previous medications. YOU HAD AN ENDOSCOPIC PROCEDURE TODAY AT [facility]:   Refer to the procedure report that was given to you for any specific questions about what was found during the examination.  If the procedure report does not answer your questions, please call your gastroenterologist to clarify.  If you requested that your care partner not be given the details of your procedure findings, then the procedure report has been included in a sealed envelope for you to review at your convenience later.  YOU SHOULD EXPECT: Some feelings of bloating in the abdomen. Passage of more gas than usual.  Walking can help get rid of the air that was put into your GI tract during the procedure and reduce the bloating. If you had a lower endoscopy (such as a colonoscopy or flexible sigmoidoscopy) you may notice spotting of blood in your stool or on the toilet paper. If you underwent a bowel prep for your procedure, you may not have a normal bowel movement for a few days.  Please Note:  You might notice some irritation and congestion in your nose or some drainage.  This is from the oxygen used during your procedure.  There is no need for concern and it should clear up in a day or so.  SYMPTOMS TO REPORT IMMEDIATELY:  Following lower endoscopy (colonoscopy or flexible sigmoidoscopy):  Excessive amounts of blood in the stool  Significant tenderness or worsening of abdominal pains  Swelling of the abdomen that is new, acute  Fever of 100F or higher   For urgent or emergent issues, a gastroenterologist can be reached at any hour by calling [phone]. Do not use MyChart messaging for urgent concerns.    DIET:  We do recommend a small meal at first, but then you may proceed to your regular diet.  Drink plenty of fluids but you should avoid alcoholic beverages for 24 hours.  ACTIVITY:  You should plan to take it easy for the rest  of today and you should NOT DRIVE or use heavy machinery until tomorrow (because of the sedation medicines used during the test).    FOLLOW UP: Our staff will call the number listed on your records the next business day following your procedure.  We will call around 7:15- 8:00 am to check on you and address any questions or concerns that you may have regarding the information given to you following your procedure. If we do not reach you, we will leave a message.  If you develop any symptoms (ie: fever, flu-like symptoms, shortness of breath, cough etc.) before then, please call [phone].  If you test positive for Covid 19 in the 2 weeks post procedure, please call and report this information to [name].    If any biopsies were taken you will be contacted by phone or by letter within the next 1-3 weeks.  Please call us at [phone] if you have not heard about the biopsies in 3 weeks.    SIGNATURES/CONFIDENTIALITY: You and/or your care partner have signed paperwork which will be entered into your electronic medical record.  These signatures attest to the fact that that the information above on your After Visit Summary has been reviewed and is understood.  Full responsibility of the confidentiality of this discharge information lies with you and/or your care-partner.

## 2022-01-04 NOTE — Progress Notes (Signed)
Pt's states no medical or surgical changes since previsit or office visit. 

## 2022-01-04 NOTE — Progress Notes (Signed)
GASTROENTEROLOGY PROCEDURE H&P NOTE   Primary Care Physician: Mosie Lukes, MD    Reason for Procedure:  Colon Cancer screening, colon polyp surveillance  Plan:    Colonoscopy  Patient is appropriate for endoscopic procedure(s) in the ambulatory ([place]) setting.  The nature of the procedure, as well as the risks, benefits, and alternatives were carefully and thoroughly reviewed with the patient. Ample time for discussion and questions allowed. The patient understood, was satisfied, and agreed to proceed.     HPI: Ebony [name] is a 56 y.o. female who presents for colonoscopy for continued colon polyp surveillance.   Patient is otherwise without complaints or active issues today.  Endoscopic history: -EGD (11/2018, Dr. Bryan Lemma): LA Grade A esophagitis, otherwise normal -Colonoscopy (11/2018, Dr. Bryan Lemma): 4 tubular adenoma polyps, largest 10 mm.  Tortuous colon.  Repeat in 3 years for ongoing surveillance.  Past Medical History:  Diagnosis Date   Arthritis    hips     BCC (basal cell carcinoma), face    skin    Concussion with no loss of consciousness 12/28/2014   Depression with anxiety 10/06/2012   Diabetes mellitus without complication ([place])    no meds--- reports as borderline , last a1c 01-05-2019 with PCP 6.3%   GERD (gastroesophageal reflux disease) 09/05/2015   MGD WITH PPI    Headache(784.0)    otc meds prn, migraine, NOW 1-2 TIMES ANNUALLY    Hyperlipidemia, mixed 12/28/2014   NOT ON ANY MEDICATIONS UPDATED 12/04/21   Hypertension    only with preg. 75yr ago 2002- no meds   Hypothyroid 06/15/2012   Obesity 09/05/2015   Post-operative nausea and vomiting    [place]    Tinea pedis 12/28/2014    Past Surgical History:  Procedure Laterality Date   ABDOMINAL HYSTERECTOMY  03/04/2012   Procedure: HYSTERECTOMY ABDOMINAL;  Surgeon: GMarylynn Pearson MD;  [facility];  Service: Gynecology;  Laterality: N/A;  with lysis  of adhesions   BASAL CELL CARCINOMA EXCISION Left    cheek/face   CESAREAN SECTION  2002   HELLP Syndrome/ one time   COLONOSCOPY     SAME TIME AS ENDO    DILATION AND CURETTAGE OF UTERUS  2007   Miscarriage   HAND SURGERY Right    RING FINGER BROKEN 09/2021   LAPAROSCOPIC LYSIS OF ADHESIONS N/A 02/17/2019   Procedure: LAPAROSCOPIC LYSIS OF ADHESIONS;  Surgeon: REveritt Amber MD;  Location: [facility];  Service: Gynecology;  Laterality: N/A;   LAPAROSCOPIC TUBAL LIGATION  01/03/2011   Procedure: LAPAROSCOPIC TUBAL LIGATION;  Surgeon: GMarylynn Pearson  [facility];  Service: Gynecology;  Laterality: Bilateral;  with filshie clips   LAPAROSCOPY  03/04/2012   Procedure: LAPAROSCOPY DIAGNOSTIC;  Surgeon: GMarylynn Pearson MD;  [facility];  Service: Gynecology;  Laterality: N/A;  with lysis of adhesions   POLYPECTOMY     ROBOTIC ASSISTED BILATERAL SALPINGO OOPHERECTOMY Bilateral 02/17/2019   Procedure: XI ROBOTIC ASSISTED BILATERAL SALPINGO OOPHORECTOMY;  Surgeon: REveritt Amber MD;  Location: [facility];  Service: Gynecology;  Laterality: Bilateral;   SHOULDER ARTHROSCOPY  2012   right   SVT ABLATION N/A 12/17/2018   Procedure: SVT ABLATION;  Surgeon: TEvans Lance MD;  [facility];  Service: Cardiovascular;  Laterality: N/A;   TONSILLECTOMY     TOTAL HIP ARTHROPLASTY Right    FEB 2021   UPPER GI ENDOSCOPY      Prior to Admission medications   Medication Sig  Start Date End Date Taking? Authorizing Provider  amLODipine (NORVASC) 5 MG tablet Take 1 tablet (5 mg total) by mouth daily. 08/21/21  Yes Lelon Perla, MD  Ascorbic Acid (VITAMIN C) 1000 MG tablet Take 1,000 mg by mouth every evening.   Yes [provider]  Cholecalciferol (VITAMIN D) 50 MCG (2000 UT) tablet Take 2,000 Units by mouth daily.   Yes [provider]  levothyroxine (SYNTHROID) 75 MCG tablet TAKE 1 TABLET BY MOUTH EVERY DAY BEFORE BREAKFAST 07/24/21  Yes Mosie Lukes, MD   metFORMIN (GLUCOPHAGE) 500 MG tablet Take 500 mg by mouth at bedtime. 09/25/21  Yes [provider]  pantoprazole (PROTONIX) 40 MG tablet TAKE 1 TABLET BY MOUTH TWICE A DAY 07/14/21  Yes Malary Aylesworth V, DO  pyridOXINE (VITAMIN B-6) 100 MG tablet Take 100 mg by mouth in the morning and at bedtime.   Yes [provider]  tiZANidine (ZANAFLEX) 2 MG tablet Take 0.5-2 tablets (1-4 mg total) by mouth at bedtime as needed for muscle spasms. 12/07/21  Yes Mosie Lukes, MD  venlafaxine XR (EFFEXOR-XR) 75 MG 24 hr capsule Take 1 capsule (75 mg total) by mouth daily with breakfast. 11/15/21  Yes Mosie Lukes, MD  clonazePAM (KLONOPIN) 0.5 MG tablet Take 1 tablet (0.5 mg total) by mouth 2 (two) times daily as needed. 10/13/21   Mosie Lukes, MD  ondansetron (ZOFRAN-ODT) 4 MG disintegrating tablet TAKE 1 TABLET BY MOUTH EVERY 8 HOURS AS NEEDED FOR NAUSEA AND VOMITING 09/30/21   [provider]  tirzepatide Darcel Bayley) 5 MG/0.5ML Pen Inject 5 mg into the skin once a week.    [provider]    Current Outpatient Medications  Medication Sig Dispense Refill   amLODipine (NORVASC) 5 MG tablet Take 1 tablet (5 mg total) by mouth daily. 90 tablet 3   Ascorbic Acid (VITAMIN C) 1000 MG tablet Take 1,000 mg by mouth every evening.     Cholecalciferol (VITAMIN D) 50 MCG (2000 UT) tablet Take 2,000 Units by mouth daily.     levothyroxine (SYNTHROID) 75 MCG tablet TAKE 1 TABLET BY MOUTH EVERY DAY BEFORE BREAKFAST 90 tablet 3   metFORMIN (GLUCOPHAGE) 500 MG tablet Take 500 mg by mouth at bedtime.     pantoprazole (PROTONIX) 40 MG tablet TAKE 1 TABLET BY MOUTH TWICE A DAY 180 tablet 5   pyridOXINE (VITAMIN B-6) 100 MG tablet Take 100 mg by mouth in the morning and at bedtime.     tiZANidine (ZANAFLEX) 2 MG tablet Take 0.5-2 tablets (1-4 mg total) by mouth at bedtime as needed for muscle spasms. 40 tablet 1   venlafaxine XR (EFFEXOR-XR) 75 MG 24 hr capsule Take 1 capsule (75 mg  total) by mouth daily with breakfast. 90 capsule 0   clonazePAM (KLONOPIN) 0.5 MG tablet Take 1 tablet (0.5 mg total) by mouth 2 (two) times daily as needed. 60 tablet 3   ondansetron (ZOFRAN-ODT) 4 MG disintegrating tablet TAKE 1 TABLET BY MOUTH EVERY 8 HOURS AS NEEDED FOR NAUSEA AND VOMITING     tirzepatide (MOUNJARO) 5 MG/0.5ML Pen Inject 5 mg into the skin once a week.     Current Facility-Administered Medications  Medication Dose Route Frequency Provider Last Rate Last Admin   0.9 %  sodium chloride infusion  500 mL Intravenous Once Keidrick Murty V, DO        Allergies as of 01/04/2022   (No Known Allergies)    Family History  Problem Relation  Age of Onset   Hypertension Mother    Depression Mother    Breast cancer Mother 43   Lung cancer Mother        lung (06-13-11)   Basal cell carcinoma Brother 79   Other Maternal Grandmother        spinal stenosis   Emphysema Maternal Grandfather    Prostate cancer Maternal Grandfather    Breast cancer Cousin 79   Colon cancer Neg Hx    Esophageal cancer Neg Hx    Pancreatic cancer Neg Hx    Stomach cancer Neg Hx    Colon polyps Neg Hx    Crohn's disease Neg Hx    Rectal cancer Neg Hx     Social History   Socioeconomic History   Marital status: Married    Spouse name: Not on file   Number of children: 1   Years of education: Not on file   Highest education level: Not on file  Occupational History    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  Tobacco Use   Smoking status: Never    Passive exposure: Past (BOTH PARENTS SMOKED)   Smokeless tobacco: Never  Vaping Use   Vaping Use: Never used  Substance and Sexual Activity   Alcohol use: Yes    Comment: occasionally    Drug use: No   Sexual activity: Yes    Birth control/protection: Surgical  Other Topics Concern   Not on file  Social History Narrative   Not on file   Social Determinants of Health   Financial Resource Strain: Not on file  Food Insecurity: Not on file  Transportation  Needs: Not on file  Physical Activity: Not on file  Stress: Not on file  Social Connections: Not on file  Intimate Partner Violence: Not on file    Physical Exam: Vital signs in last 24 hours: '@BP'$  102/74   Pulse 98   Temp 98.4 F (36.9 C)   Ht '5\' 5"'$  (1.651 m)   Wt 206 lb 12.8 oz (93.8 kg)   LMP 12/15/2010   SpO2 97%   BMI 34.41 kg/m  GEN: NAD EYE: Sclerae anicteric ENT: MMM CV: Non-tachycardic Pulm: CTA b/l GI: Soft, NT/ND NEURO:  Alert & Oriented x 3   Gerrit Heck, DO [facility]   01/04/2022 7:46 AM

## 2022-01-05 ENCOUNTER — Telehealth: Payer: Self-pay

## 2022-01-05 NOTE — Telephone Encounter (Signed)
  Follow up Call-     01/04/2022    7:32 AM  Call back number  Post procedure Call Back phone  # [phone]  Permission to leave phone message Yes     Patient questions:  Do you have a fever, pain , or abdominal swelling? No. Pain Score  0 *  Have you tolerated food without any problems? Yes.    Have you been able to return to your normal activities? Yes.    Do you have any questions about your discharge instructions: Diet   No. Medications  No. Follow up visit  No.  Do you have questions or concerns about your Care? No.  Actions: * If pain score is 4 or above: No action needed, pain <4.

## 2022-02-10 ENCOUNTER — Other Ambulatory Visit: Payer: Self-pay | Admitting: Family Medicine

## 2022-05-31 ENCOUNTER — Other Ambulatory Visit: Payer: Self-pay | Admitting: Obstetrics and Gynecology

## 2022-05-31 DIAGNOSIS — Z1589 Genetic susceptibility to other disease: Secondary | ICD-10-CM

## 2022-06-06 ENCOUNTER — Other Ambulatory Visit: Payer: Self-pay | Admitting: Family Medicine

## 2022-06-12 ENCOUNTER — Other Ambulatory Visit: Payer: Self-pay | Admitting: Family Medicine

## 2022-06-19 ENCOUNTER — Ambulatory Visit
Admission: RE | Admit: 2022-06-19 | Discharge: 2022-06-19 | Disposition: A | Discharge status: Home / Self Care | Payer: Managed Care, Other (non HMO) | Source: Ambulatory Visit | Attending: Obstetrics and Gynecology | Admitting: Obstetrics and Gynecology

## 2022-06-19 DIAGNOSIS — Z1589 Genetic susceptibility to other disease: Secondary | ICD-10-CM

## 2022-06-19 MED ORDER — GADOPICLENOL 0.5 MMOL/ML IV SOLN
10.0000 mL | Freq: Once | INTRAVENOUS | Status: AC | PRN
  Administered 2022-06-19: 10 mL via INTRAVENOUS

## 2022-06-20 ENCOUNTER — Other Ambulatory Visit: Payer: Self-pay | Admitting: Obstetrics and Gynecology

## 2022-06-20 DIAGNOSIS — R9389 Abnormal findings on diagnostic imaging of other specified body structures: Secondary | ICD-10-CM

## 2022-06-25 ENCOUNTER — Ambulatory Visit
Admission: RE | Admit: 2022-06-25 | Discharge: 2022-06-25 | Disposition: A | Discharge status: Home / Self Care | Payer: Managed Care, Other (non HMO) | Source: Ambulatory Visit | Attending: Obstetrics and Gynecology | Admitting: Obstetrics and Gynecology

## 2022-06-25 ENCOUNTER — Ambulatory Visit: Payer: Managed Care, Other (non HMO)

## 2022-06-25 DIAGNOSIS — R9389 Abnormal findings on diagnostic imaging of other specified body structures: Secondary | ICD-10-CM

## 2022-07-02 ENCOUNTER — Telehealth: Payer: Self-pay | Admitting: Family Medicine

## 2022-07-02 DIAGNOSIS — E782 Mixed hyperlipidemia: Secondary | ICD-10-CM

## 2022-07-02 DIAGNOSIS — E119 Type 2 diabetes mellitus without complications: Secondary | ICD-10-CM

## 2022-07-02 DIAGNOSIS — E039 Hypothyroidism, unspecified: Secondary | ICD-10-CM

## 2022-07-02 DIAGNOSIS — Z0000 Encounter for general adult medical examination without abnormal findings: Secondary | ICD-10-CM

## 2022-07-02 NOTE — Telephone Encounter (Signed)
Patient had a CPE 2/01 with blyth that had to be rescheduled I rescheduled her with MO next week in the PM  Anyway we can add labs so she can come in before hand??   Also not sure if it has to be [place] or [facility] that inserts  (sent to both providers)

## 2022-07-03 NOTE — Telephone Encounter (Signed)
Called pt lab appt  Made for tomorrow

## 2022-07-03 NOTE — Telephone Encounter (Signed)
Orders have been placed

## 2022-07-04 ENCOUNTER — Other Ambulatory Visit (INDEPENDENT_AMBULATORY_CARE_PROVIDER_SITE_OTHER): Payer: Managed Care, Other (non HMO)

## 2022-07-04 DIAGNOSIS — E039 Hypothyroidism, unspecified: Secondary | ICD-10-CM

## 2022-07-04 DIAGNOSIS — E119 Type 2 diabetes mellitus without complications: Secondary | ICD-10-CM | POA: Diagnosis not present

## 2022-07-04 DIAGNOSIS — E782 Mixed hyperlipidemia: Secondary | ICD-10-CM

## 2022-07-04 DIAGNOSIS — Z0000 Encounter for general adult medical examination without abnormal findings: Secondary | ICD-10-CM

## 2022-07-04 LAB — LIPID PANEL
Cholesterol: 167 mg/dL (ref 0–200)
HDL: 48.7 mg/dL (ref >39.00)
LDL Cholesterol: 96 mg/dL (ref 0–99)
NonHDL: 117.9
Total CHOL/HDL Ratio: 3
Triglycerides: 110 mg/dL (ref 0.0–149.0)
VLDL: 22 mg/dL (ref 0.0–40.0)

## 2022-07-04 LAB — COMPREHENSIVE METABOLIC PANEL
ALT: 15 U/L (ref 0–35)
AST: 19 U/L (ref 0–37)
Albumin: 4.4 g/dL (ref 3.5–5.2)
Alkaline Phosphatase: 64 U/L (ref 39–117)
BUN: 21 mg/dL (ref 6–23)
CO2: 27 mEq/L (ref 19–32)
Calcium: 9.8 mg/dL (ref 8.4–10.5)
Chloride: 105 mEq/L (ref 96–112)
Creatinine, Ser: 0.82 mg/dL (ref 0.40–1.20)
GFR: 81.09 mL/min (ref >60.00)
Glucose, Bld: 83 mg/dL (ref 70–99)
Potassium: 4.2 mEq/L (ref 3.5–5.1)
Sodium: 140 mEq/L (ref 135–145)
Total Bilirubin: 0.4 mg/dL (ref 0.2–1.2)
Total Protein: 7.3 g/dL (ref 6.0–8.3)

## 2022-07-04 LAB — CBC WITH DIFFERENTIAL/PLATELET
Basophils Absolute: 0 10*3/uL (ref 0.0–0.1)
Basophils Relative: 0.5 % (ref 0.0–3.0)
Eosinophils Absolute: 0.3 10*3/uL (ref 0.0–0.7)
Eosinophils Relative: 3.4 % (ref 0.0–5.0)
HCT: 40.1 % (ref 36.0–46.0)
Hemoglobin: 13.3 g/dL (ref 12.0–15.0)
Lymphocytes Relative: 33.6 % (ref 12.0–46.0)
Lymphs Abs: 2.9 10*3/uL (ref 0.7–4.0)
MCHC: 33.3 g/dL (ref 30.0–36.0)
MCV: 78 fl (ref 78.0–100.0)
Monocytes Absolute: 0.5 10*3/uL (ref 0.1–1.0)
Monocytes Relative: 6.2 % (ref 3.0–12.0)
Neutro Abs: 4.8 10*3/uL (ref 1.4–7.7)
Neutrophils Relative %: 56.3 % (ref 43.0–77.0)
Platelets: 285 10*3/uL (ref 150.0–400.0)
RBC: 5.14 Mil/uL — ABNORMAL HIGH (ref 3.87–5.11)
RDW: 15.7 % — ABNORMAL HIGH (ref 11.5–15.5)
WBC: 8.5 10*3/uL (ref 4.0–10.5)

## 2022-07-04 LAB — HEMOGLOBIN A1C: Hgb A1c MFr Bld: 5.8 % (ref 4.6–6.5)

## 2022-07-04 LAB — TSH: TSH: 1.35 u[IU]/mL (ref 0.35–5.50)

## 2022-07-05 ENCOUNTER — Ambulatory Visit
Admission: RE | Admit: 2022-07-05 | Discharge: 2022-07-05 | Disposition: A | Discharge status: Home / Self Care | Payer: Managed Care, Other (non HMO) | Source: Ambulatory Visit | Attending: Obstetrics and Gynecology | Admitting: Obstetrics and Gynecology

## 2022-07-05 ENCOUNTER — Encounter: Payer: 59 | Admitting: Family Medicine

## 2022-07-05 DIAGNOSIS — N6322 Unspecified lump in the left breast, upper inner quadrant: Secondary | ICD-10-CM

## 2022-07-05 DIAGNOSIS — R9389 Abnormal findings on diagnostic imaging of other specified body structures: Secondary | ICD-10-CM

## 2022-07-05 MED ORDER — GADOPICLENOL 0.5 MMOL/ML IV SOLN
10.0000 mL | Freq: Once | INTRAVENOUS | Status: AC | PRN
  Administered 2022-07-05: 10 mL via INTRAVENOUS

## 2022-07-10 ENCOUNTER — Ambulatory Visit (INDEPENDENT_AMBULATORY_CARE_PROVIDER_SITE_OTHER): Payer: 59 | Admitting: Family

## 2022-07-10 ENCOUNTER — Encounter: Payer: Self-pay | Admitting: Family

## 2022-07-10 VITALS — BP 131/82 | HR 71 | Temp 97.9°F | Resp 16 | Ht 65.0 in | Wt 192.0 lb

## 2022-07-10 DIAGNOSIS — E119 Type 2 diabetes mellitus without complications: Secondary | ICD-10-CM

## 2022-07-10 DIAGNOSIS — Z0000 Encounter for general adult medical examination without abnormal findings: Secondary | ICD-10-CM

## 2022-07-10 DIAGNOSIS — Z1501 Genetic susceptibility to malignant neoplasm of breast: Secondary | ICD-10-CM

## 2022-07-10 DIAGNOSIS — Z23 Encounter for immunization: Secondary | ICD-10-CM

## 2022-07-10 MED ORDER — TIRZEPATIDE 7.5 MG/0.5ML ~~LOC~~ SOAJ: 7.5000 mg | SUBCUTANEOUS | Status: DC

## 2022-07-10 NOTE — Assessment & Plan Note (Signed)
Lab Results  Component Value Date   HGBA1C 5.8 07/04/2022   HGBA1C 5.9 12/04/2021   HGBA1C 6.7 (H) 01/14/2020   Lab Results  Component Value Date   MICROALBUR 1.8 03/22/2016   LDLCALC 96 07/04/2022   CREATININE 0.82 07/04/2022   Follow up A1C is great given recent weight loss on Mounjaro.

## 2022-07-10 NOTE — Progress Notes (Addendum)
Subjective:   By signing my name below, I, Madelin Rear, attest that this documentation has been prepared under the direction and in the presence of Debbrah Alar, NP. 07/10/2022.   Patient ID: Ebony [name], female    DOB: 1966-04-14, 57 y.o.   MRN: [id number]  Chief Complaint  Patient presents with   Annual Exam    HPI Patient is in today for a comprehensive physical exam.  Surgical History: In 08/2021 she suffered a fracture of her right 4th finger and underwent surgical repair in April with screw placement. She underwent revision surgery 03/2022 with removal of the screws. She has recovered well at this time and able to make a fist.  Social history: Her father passed away last Jul 30, 2022, and she subsequently developed new anxiety. She was given Klonopin at the time. Since then she has completed counseling which helped. In her family medical history, her mother had breast cancer, Two brothers, no sisters. She has one son who has benign brain tumors, followed regularly. 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  Colonoscopy: Last completed 01/04/2022.  Pap Smear: Last completed 02/17/2019. She is post hysterectomy.  Mammogram:  Last completed 03/18/2019. She recently had an MRI due to a known BRIP1 genetic mutation. Her imaging was was notable for a small mass. Biopsy was benign.  Immunizations: Tdap vaccine last received 06/13/2012, which we will update today. Influenza vaccine last received 05/05/2021. She last received a COVID vaccination 05/05/2021.   Diet: She endorses following a healthy diet generally. Currently on Mounjaro 7.5 mg. She has lost 36 lbs so far. Occasional alcohol use. No tobacco use, no vaping.  Exercise:  She routinely works out twice a week. She also enjoys music and reading, and stays active socially.  Vision: Eye exam completed in December 2023.  Denies having any fever, new muscle pain, joint pain , new moles, congestion, sinus pain, sore throat,  chest pain, palpations, cough, SOB ,wheezing,n/v/d constipation, blood in stool, dysuria, frequency, hematuria, at this time   Past Medical History:  Diagnosis Date   Arthritis    hips     BCC (basal cell carcinoma), face    skin    Concussion with no loss of consciousness 12/28/2014   Depression with anxiety 10/06/2012   Diabetes mellitus without complication ([place])    no meds--- reports as borderline , last a1c 01-05-2019 with PCP 6.3%   GERD (gastroesophageal reflux disease) 09/05/2015   MGD WITH PPI    Headache(784.0)    otc meds prn, migraine, NOW 1-2 TIMES ANNUALLY    Hyperlipidemia, mixed 12/28/2014   NOT ON ANY MEDICATIONS UPDATED 12/04/21   Hypertension    only with preg. 68yr ago 2002- no meds   Hypothyroid 06/15/2012   Obesity 09/05/2015   Post-operative nausea and vomiting    SVT (supraventricular tachycardia)    Tinea pedis 12/28/2014    Past Surgical History:  Procedure Laterality Date   ABDOMINAL HYSTERECTOMY  03/04/2012   Procedure: HYSTERECTOMY ABDOMINAL;  Surgeon: GMarylynn Pearson MD;  [facility];  Service: Gynecology;  Laterality: N/A;  with lysis of adhesions   BASAL CELL CARCINOMA EXCISION Left    cheek/face   CESAREAN SECTION  2002   HELLP Syndrome/ one time   COLONOSCOPY     SAME TIME AS ENDO    DILATION AND CURETTAGE OF UTERUS  2007   Miscarriage   FINGER SURGERY     2023 x 2 (ORIF and removal)   HAND SURGERY Right  RING FINGER BROKEN 09/2021   LAPAROSCOPIC LYSIS OF ADHESIONS N/A 02/17/2019   Procedure: LAPAROSCOPIC LYSIS OF ADHESIONS;  Surgeon: Everitt Amber, MD;  Location: [facility];  Service: Gynecology;  Laterality: N/A;   LAPAROSCOPIC TUBAL LIGATION  01/03/2011   Procedure: LAPAROSCOPIC TUBAL LIGATION;  Surgeon: Marylynn Pearson;  Location: [facility];  Service: Gynecology;  Laterality: Bilateral;  with filshie clips   LAPAROSCOPY  03/04/2012   Procedure: LAPAROSCOPY DIAGNOSTIC;  Surgeon: Marylynn Pearson, MD;  Location: [facility];  Service:  Gynecology;  Laterality: N/A;  with lysis of adhesions   POLYPECTOMY     ROBOTIC ASSISTED BILATERAL SALPINGO OOPHERECTOMY Bilateral 02/17/2019   Procedure: XI ROBOTIC ASSISTED BILATERAL SALPINGO OOPHORECTOMY;  Surgeon: Everitt Amber, MD;  Location: [facility];  Service: Gynecology;  Laterality: Bilateral;   SHOULDER ARTHROSCOPY  2012   right   SVT ABLATION N/A 12/17/2018   Procedure: SVT ABLATION;  Surgeon: Evans Lance, MD;  Location: [facility];  Service: Cardiovascular;  Laterality: N/A;   TONSILLECTOMY     TOTAL HIP ARTHROPLASTY Right    FEB 2021   UPPER GI ENDOSCOPY      Family History  Problem Relation Age of Onset   Hypertension Mother    Depression Mother    Breast cancer Mother 82   Lung cancer Mother        lung (06-13-11)   Basal cell carcinoma Brother 67   Other Maternal Grandmother        spinal stenosis   Emphysema Maternal Grandfather    Prostate cancer Maternal Grandfather    Breast cancer Cousin 72   Colon cancer Neg Hx    Esophageal cancer Neg Hx    Pancreatic cancer Neg Hx    Stomach cancer Neg Hx    Colon polyps Neg Hx    Crohn's disease Neg Hx    Rectal cancer Neg Hx     Social History   Socioeconomic History   Marital status: Married    Spouse name: Not on file   Number of children: 1   Years of education: Not on file   Highest education level: Not on file  Occupational History    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  Tobacco Use   Smoking status: Never    Passive exposure: Past (BOTH PARENTS SMOKED)   Smokeless tobacco: Never  Vaping Use   Vaping Use: Never used  Substance and Sexual Activity   Alcohol use: Yes    Comment: occasionally    Drug use: No   Sexual activity: Yes    Partners: Male    Birth control/protection: Surgical  Other Topics Concern   Not on file  Social History Narrative   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     Cholecalciferol (VITAMIN D) 50 MCG (2000 UT) tablet Take 2,000 Units by mouth daily.     clonazePAM (KLONOPIN) 0.5 MG tablet Take 1 tablet (0.5 mg total) by mouth  2 (two) times daily as needed. 60 tablet 3   levothyroxine (SYNTHROID) 75 MCG tablet TAKE 1 TABLET BY MOUTH EVERY DAY BEFORE BREAKFAST 90 tablet 3   metFORMIN (GLUCOPHAGE) 500 MG tablet Take 500 mg by mouth at bedtime.     pantoprazole (PROTONIX) 40 MG tablet TAKE 1 TABLET BY MOUTH TWICE A DAY 180 tablet 5   pyridOXINE (VITAMIN B-6) 100 MG tablet Take 100 mg by mouth in the morning and at bedtime.     venlafaxine XR (EFFEXOR-XR) 75 MG 24 hr capsule TAKE 1 CAPSULE BY MOUTH DAILY WITH BREAKFAST. 90 capsule 1   tirzepatide (MOUNJARO) 5 MG/0.5ML Pen Inject 5 mg into the skin once a week.     amLODipine (NORVASC) 5 MG tablet Take 1 tablet (5 mg total) by mouth daily. (Patient not taking: Reported on 07/10/2022) 90 tablet 3   ondansetron (ZOFRAN-ODT) 4 MG disintegrating tablet TAKE 1 TABLET BY MOUTH EVERY 8 HOURS AS NEEDED FOR NAUSEA AND VOMITING     tiZANidine (ZANAFLEX) 2 MG tablet Take 0.5-2 tablets (1-4 mg total) by mouth at bedtime as needed for muscle spasms. 40 tablet 1   No facility-administered medications prior to visit.    No Known Allergies  Review of Systems  Constitutional:  Negative for fever.  HENT:  Negative for congestion, sinus pain and sore throat.   Respiratory:  Negative for cough, shortness of breath and wheezing.   Cardiovascular:  Negative for chest pain and palpitations.  Gastrointestinal:  Negative for blood in  stool, constipation, diarrhea, nausea and vomiting.  Genitourinary:  Negative for dysuria, frequency and hematuria.  Musculoskeletal:  Negative for joint pain and myalgias.       Objective:    Physical Exam Constitutional:      Appearance: Normal appearance.  HENT:     Head: Normocephalic and atraumatic.     Right Ear: Tympanic membrane, ear canal and external ear normal.     Left Ear: Tympanic membrane, ear canal and external ear normal.  Eyes:     Extraocular Movements: Extraocular movements intact.     Pupils: Pupils are equal, round, and reactive to light.  Cardiovascular:     Rate and Rhythm: Normal rate and regular rhythm.     Heart sounds: Normal heart sounds. No murmur heard.    No gallop.  Pulmonary:     Effort: Pulmonary effort is normal. No respiratory distress.     Breath sounds: Normal breath sounds. No wheezing or rales.  Abdominal:     General: Bowel sounds are normal. There is no distension.     Palpations: Abdomen is soft.     Tenderness: There is no abdominal tenderness. There is no guarding.  Musculoskeletal:        General: Normal range of motion.  Skin:    General: Skin is warm and dry.     Comments: Surgical scar of right 4th finger.  Neurological:     General: No focal deficit present.     Mental Status: She is alert and oriented to person, place, and time.     Motor: Motor function is intact.     Deep Tendon Reflexes:     Reflex Scores:      Patellar reflexes are 2+ on the right side and 2+ on the left side. Psychiatric:        Mood and Affect: Mood normal.        Behavior: Behavior normal.     BP 131/82 (BP Location: Right Arm,  Patient Position: Sitting, Cuff Size: Small)   Pulse 71   Temp 97.9 F (36.6 C) (Oral)   Resp 16   Ht '5\' 5"'$  (1.651 m)   Wt 192 lb (87.1 kg)   LMP 12/15/2010   SpO2 98%   BMI 31.95 kg/m  Wt Readings from Last 3 Encounters:  07/10/22 192 lb (87.1 kg)  01/04/22 206 lb 12.8 oz (93.8 kg)  12/04/21 206 lb 12.8 oz  (93.8 kg)         Assessment & Plan:   Problem List Items Addressed This Visit       Unprioritized   Type 2 diabetes mellitus without complication, without long-term current use of insulin ([place])    Lab Results  Component Value Date   HGBA1C 5.8 07/04/2022   HGBA1C 5.9 12/04/2021   HGBA1C 6.7 (H) 01/14/2020   Lab Results  Component Value Date   MICROALBUR 1.8 03/22/2016   White 96 07/04/2022   CREATININE 0.82 07/04/2022  Follow up A1C is great given recent weight loss on Mounjaro.       Relevant Medications   tirzepatide (MOUNJARO) 7.5 MG/0.5ML Pen   Preventative health care    Continues healthy diet, exercise and weight loss efforts.  Breast cancer screening is up to date. Td today.  Colo up to date.  Declines covid and flu vaccine.       Monoallelic mutation of BRIP1 gene    Had MRI breast performed recently with GYN for screening purposes.  She s/p TAH BSO.       Other Visit Diagnoses     Need for Td vaccine    -  Primary   Relevant Orders   Td : Tetanus/diphtheria >7yo Preservative  free (Completed)        Meds ordered this encounter  Medications   tirzepatide (MOUNJARO) 7.5 MG/0.5ML Pen    Sig: Inject 7.5 mg into the skin once a week.    Dispense:  6 mL    Order Specific Question:   Supervising Provider    Answer:   Penni Homans A [[id number]]    I, Nance Pear, NP, personally preformed the services described in this documentation.  All medical record entries made by the scribe were at my direction and in my presence.  I have reviewed the chart and discharge instructions (if applicable) and agree that the record reflects my personal performance and is accurate and complete. 07/10/2022.  I,Mathew Stumpf,acting as a Education administrator for [employer], NP.,have documented all relevant documentation on the behalf of Nance Pear, NP,as directed by  Nance Pear, NP while in the presence of Nance Pear, NP.   Nance Pear,  NP

## 2022-07-10 NOTE — Assessment & Plan Note (Addendum)
Continues healthy diet, exercise and weight loss efforts.  Breast cancer screening is up to date. Td today.  Colo up to date.  Declines covid and flu vaccine.

## 2022-07-10 NOTE — Assessment & Plan Note (Addendum)
Had MRI breast performed recently with GYN for screening purposes.  She s/p TAH BSO.

## 2022-07-16 ENCOUNTER — Encounter: Payer: Self-pay | Admitting: Family Medicine

## 2022-09-09 ENCOUNTER — Other Ambulatory Visit: Payer: Self-pay | Admitting: Gastroenterology

## 2022-09-09 DIAGNOSIS — K219 Gastro-esophageal reflux disease without esophagitis: Secondary | ICD-10-CM

## 2022-10-08 ENCOUNTER — Other Ambulatory Visit: Payer: Self-pay | Admitting: Family Medicine

## 2022-10-23 ENCOUNTER — Encounter: Payer: Self-pay | Admitting: *Deleted

## 2022-10-23 ENCOUNTER — Ambulatory Visit (INDEPENDENT_AMBULATORY_CARE_PROVIDER_SITE_OTHER): Payer: Managed Care, Other (non HMO) | Admitting: Sports Medicine

## 2022-10-23 VITALS — BP 118/84 | Ht 65.0 in | Wt 185.0 lb

## 2022-10-23 DIAGNOSIS — Z96641 Presence of right artificial hip joint: Secondary | ICD-10-CM | POA: Diagnosis not present

## 2022-10-23 DIAGNOSIS — M25551 Pain in right hip: Secondary | ICD-10-CM | POA: Diagnosis not present

## 2022-10-23 NOTE — Addendum Note (Signed)
Addended by: Annita Brod on: 10/23/2022 10:05 AM   Modules accepted: Orders

## 2022-10-23 NOTE — Progress Notes (Signed)
   Subjective:    Patient ID: Ebony [name], female    DOB: Dec 23, 1965, 57 y.o.   MRN: [id number]  HPI chief complaint: Right hip pain  Patient is a very pleasant 57 year old female that presents today complaining of right hip pain.  She is status post right total hip arthroplasty done in 2021 by Dr. Lequita Halt.  She then suffered a hip dislocation and 2022.  She has had chronic right groin pain despite hip replacement.  Her pain is present with weightbearing.  It can be extremely painful causing her to limp.  She has been evaluated at [facility] for this.  X-rays showed no evidence of hardware loosening.  She has tried prednisone in the past without any help.  She has also tried some chiropractic treatment recently for possible iliopsoas tendinopathy.  That treatment has been somewhat helpful but not curative.  She also endorses pain along the posterior lateral hip at times.  Her pain improves at rest.  She is very active and works with a Systems analyst.  She is very frustrated by her ongoing pain and lack of diagnosis.  Her pain does radiate down the thigh to the knee.  She also endorses some cramping in the thigh.  800 mg of ibuprofen helps somewhat.  Past medical history reviewed Medications reviewed Allergies reviewed  Review of Systems As above    Objective:   Physical Exam  Well-developed, well-nourished.  No acute distress  Right hip: Smooth painless hip range of motion with a negative logroll.  She does have some pain with resisted hip flexion.  Some hip weakness as well.  Pain is present with weightbearing and walking.      Assessment & Plan:   Chronic right hip pain status post right hip arthroplasty in 2021  I'm going to start with ordering a triple phase bone scan to rule out hardware loosening of her right hip arthroplasty.  I will message her through MyChart with those results when available.  I did discuss the possibility of a referral to Dr. Tildon Husky for a  second opinion.  Will discuss that further after I review her bone scan.  In the meantime, she will continue to work on strengthening with her personal trainer and she can continue with chiropractic treatment for presumed iliopsoas tendinopathy if she is noticing benefit from that.  This note was dictated using Dragon naturally speaking software and may contain errors in syntax, spelling, or content which have not been identified prior to signing this note.

## 2022-10-31 ENCOUNTER — Encounter (HOSPITAL_COMMUNITY)
Admission: RE | Admit: 2022-10-31 | Discharge: 2022-10-31 | Disposition: A | Discharge status: Home / Self Care | Payer: Managed Care, Other (non HMO) | Source: Ambulatory Visit | Attending: Sports Medicine | Admitting: Sports Medicine

## 2022-10-31 DIAGNOSIS — T8484XA Pain due to internal orthopedic prosthetic devices, implants and grafts, initial encounter: Secondary | ICD-10-CM | POA: Insufficient documentation

## 2022-10-31 DIAGNOSIS — G8928 Other chronic postprocedural pain: Secondary | ICD-10-CM | POA: Insufficient documentation

## 2022-10-31 DIAGNOSIS — Z96641 Presence of right artificial hip joint: Secondary | ICD-10-CM | POA: Diagnosis not present

## 2022-10-31 DIAGNOSIS — M25551 Pain in right hip: Secondary | ICD-10-CM | POA: Insufficient documentation

## 2022-10-31 MED ORDER — TECHNETIUM TC 99M MEDRONATE IV KIT
20.0000 | PACK | Freq: Once | INTRAVENOUS | Status: AC | PRN
  Administered 2022-10-31: 20 via INTRAVENOUS

## 2022-11-05 ENCOUNTER — Encounter: Payer: Self-pay | Admitting: Sports Medicine

## 2022-12-05 ENCOUNTER — Other Ambulatory Visit: Payer: Self-pay | Admitting: Orthopedic Surgery

## 2022-12-05 DIAGNOSIS — T84030A Mechanical loosening of internal right hip prosthetic joint, initial encounter: Secondary | ICD-10-CM

## 2022-12-13 ENCOUNTER — Ambulatory Visit
Admission: RE | Admit: 2022-12-13 | Discharge: 2022-12-13 | Disposition: A | Discharge status: Home / Self Care | Payer: Managed Care, Other (non HMO) | Source: Ambulatory Visit | Attending: Orthopedic Surgery | Admitting: Orthopedic Surgery

## 2022-12-13 DIAGNOSIS — T84030A Mechanical loosening of internal right hip prosthetic joint, initial encounter: Secondary | ICD-10-CM

## 2022-12-21 ENCOUNTER — Emergency Department (HOSPITAL_BASED_OUTPATIENT_CLINIC_OR_DEPARTMENT_OTHER): Payer: Managed Care, Other (non HMO)

## 2022-12-21 ENCOUNTER — Other Ambulatory Visit: Payer: Self-pay

## 2022-12-21 ENCOUNTER — Encounter (HOSPITAL_BASED_OUTPATIENT_CLINIC_OR_DEPARTMENT_OTHER): Payer: Self-pay

## 2022-12-21 ENCOUNTER — Emergency Department (HOSPITAL_BASED_OUTPATIENT_CLINIC_OR_DEPARTMENT_OTHER)
Admission: EM | Admit: 2022-12-21 | Discharge: 2022-12-21 | Disposition: A | Discharge status: Home / Self Care | Payer: Managed Care, Other (non HMO) | Attending: Emergency Medicine | Admitting: Emergency Medicine

## 2022-12-21 DIAGNOSIS — R519 Headache, unspecified: Secondary | ICD-10-CM | POA: Insufficient documentation

## 2022-12-21 DIAGNOSIS — R110 Nausea: Secondary | ICD-10-CM | POA: Insufficient documentation

## 2022-12-21 DIAGNOSIS — Z79899 Other long term (current) drug therapy: Secondary | ICD-10-CM | POA: Insufficient documentation

## 2022-12-21 DIAGNOSIS — Z7984 Long term (current) use of oral hypoglycemic drugs: Secondary | ICD-10-CM | POA: Insufficient documentation

## 2022-12-21 DIAGNOSIS — E119 Type 2 diabetes mellitus without complications: Secondary | ICD-10-CM | POA: Insufficient documentation

## 2022-12-21 DIAGNOSIS — E039 Hypothyroidism, unspecified: Secondary | ICD-10-CM | POA: Diagnosis not present

## 2022-12-21 LAB — CBC WITH DIFFERENTIAL/PLATELET
Abs Immature Granulocytes: 0.04 10*3/uL (ref 0.00–0.07)
Basophils Absolute: 0 10*3/uL (ref 0.0–0.1)
Basophils Relative: 0 %
Eosinophils Absolute: 0.3 10*3/uL (ref 0.0–0.5)
Eosinophils Relative: 2 %
HCT: 42 % (ref 36.0–46.0)
Hemoglobin: 14.1 g/dL (ref 12.0–15.0)
Immature Granulocytes: 0 %
Lymphocytes Relative: 6 %
Lymphs Abs: 0.7 10*3/uL (ref 0.7–4.0)
MCH: 26.9 pg (ref 26.0–34.0)
MCHC: 33.6 g/dL (ref 30.0–36.0)
MCV: 80.2 fL (ref 80.0–100.0)
Monocytes Absolute: 0.4 10*3/uL (ref 0.1–1.0)
Monocytes Relative: 3 %
Neutro Abs: 12 10*3/uL — ABNORMAL HIGH (ref 1.7–7.7)
Neutrophils Relative %: 89 %
Platelets: 333 10*3/uL (ref 150–400)
RBC: 5.24 MIL/uL — ABNORMAL HIGH (ref 3.87–5.11)
RDW: 13.6 % (ref 11.5–15.5)
WBC: 13.6 10*3/uL — ABNORMAL HIGH (ref 4.0–10.5)
nRBC: 0 % (ref 0.0–0.2)

## 2022-12-21 LAB — BASIC METABOLIC PANEL
Anion gap: 8 (ref 5–15)
BUN: 15 mg/dL (ref 6–20)
CO2: 22 mmol/L (ref 22–32)
Calcium: 8.8 mg/dL — ABNORMAL LOW (ref 8.9–10.3)
Chloride: 104 mmol/L (ref 98–111)
Creatinine, Ser: 0.83 mg/dL (ref 0.44–1.00)
GFR, Estimated: >60 mL/min (ref >60)
Glucose, Bld: 111 mg/dL — ABNORMAL HIGH (ref 70–99)
Potassium: 3.3 mmol/L — ABNORMAL LOW (ref 3.5–5.1)
Sodium: 134 mmol/L — ABNORMAL LOW (ref 135–145)

## 2022-12-21 LAB — URINALYSIS, ROUTINE W REFLEX MICROSCOPIC
Bilirubin Urine: NEGATIVE
Glucose, UA: NEGATIVE mg/dL
Ketones, ur: NEGATIVE mg/dL
Nitrite: NEGATIVE
Protein, ur: NEGATIVE mg/dL
Specific Gravity, Urine: 1.025 (ref 1.005–1.030)
pH: 6 (ref 5.0–8.0)

## 2022-12-21 LAB — URINALYSIS, MICROSCOPIC (REFLEX)

## 2022-12-21 LAB — PREGNANCY, URINE: Preg Test, Ur: NEGATIVE

## 2022-12-21 MED ORDER — SODIUM CHLORIDE 0.9 % IV BOLUS
1000.0000 mL | Freq: Once | INTRAVENOUS | Status: AC
  Administered 2022-12-21: 1000 mL via INTRAVENOUS

## 2022-12-21 MED ORDER — METOCLOPRAMIDE HCL 5 MG/ML IJ SOLN
10.0000 mg | Freq: Once | INTRAMUSCULAR | Status: AC
  Administered 2022-12-21: 10 mg via INTRAVENOUS

## 2022-12-21 MED ORDER — ACETAMINOPHEN 500 MG PO TABS
1000.0000 mg | ORAL_TABLET | Freq: Once | ORAL | Status: AC
  Administered 2022-12-21: 1000 mg via ORAL

## 2022-12-21 MED ORDER — DIPHENHYDRAMINE HCL 50 MG/ML IJ SOLN
12.5000 mg | Freq: Once | INTRAMUSCULAR | Status: AC
  Administered 2022-12-21: 12.5 mg via INTRAVENOUS

## 2022-12-21 MED ORDER — IOHEXOL 350 MG/ML SOLN
75.0000 mL | Freq: Once | INTRAVENOUS | Status: AC | PRN
  Administered 2022-12-21: 75 mL via INTRAVENOUS

## 2022-12-21 NOTE — Discharge Instructions (Signed)
You have been seen today for your complaint of headache. Your lab work  was reassuring. Your imaging was reassuring. Follow up with: your PCP within the next week for reevaluation Please seek immediate medical care if you develop any of the following symptoms: Your headache: Becomes severe quickly. Gets worse after moderate to intense physical activity. You have any of these symptoms: Repeated vomiting. Pain or stiffness in your neck. Changes to your vision. Pain in an eye or ear. Problems with speech. Muscular weakness or loss of muscle control. Loss of balance or coordination. You feel faint or pass out. You have confusion. You have a seizure. At this time there does not appear to be the presence of an emergent medical condition, however there is always the potential for conditions to change. Please read and follow the below instructions.  Do not take your medicine if  develop an itchy rash, swelling in your mouth or lips, or difficulty breathing; call 911 and seek immediate emergency medical attention if this occurs.  You may review your lab tests and imaging results in their entirety on your MyChart account.  Please discuss all results of fully with your primary care provider and other specialist at your follow-up visit.  Note: Portions of this text may have been transcribed using voice recognition software. Every effort was made to ensure accuracy; however, inadvertent computerized transcription errors may still be present.

## 2022-12-21 NOTE — ED Triage Notes (Signed)
Patient having severe headache since last night with some nausea. No recent head trauma.

## 2022-12-21 NOTE — ED Provider Notes (Signed)
[facility] Provider Note   CSN: [id number] Arrival date & time: 12/21/22  4098     History  Chief Complaint  Patient presents with   Migraine    LEVITA MONICAL is a 57 y.o. female. With a history of anxiety, depression, migraine headaches, diabetes, hypothyroidism who presents to the ED for evaluation of a headache. She went to bed at 10 PM last night. Woke up 30 minutes later with a severe headache. Localized to the forehead and described as a throbbing sensation. She has taken ibuprofen with minimal relief. States this feels different than her typical migraines. Reports some nausea but no vomiting. No fevers or neck stiffness. No numbness, weakness or tingling. No vision changes. No recent head trauma. She does not smoke, has not drank alcohol recently. She reports photophobia and phonophobia.     Migraine Associated symptoms include headaches.       Home Medications Prior to Admission medications   Medication Sig Start Date End Date Taking? Authorizing Provider  amLODipine (NORVASC) 5 MG tablet Take 1 tablet (5 mg total) by mouth daily. Patient not taking: Reported on 07/10/2022 08/21/21   Lewayne Bunting, MD  Ascorbic Acid (VITAMIN C) 1000 MG tablet Take 1,000 mg by mouth every evening.    [provider]  Cholecalciferol (VITAMIN D) 50 MCG (2000 UT) tablet Take 2,000 Units by mouth daily.    [provider]  clonazePAM (KLONOPIN) 0.5 MG tablet Take 1 tablet (0.5 mg total) by mouth 2 (two) times daily as needed. 10/13/21   Bradd Canary, MD  levothyroxine (SYNTHROID) 75 MCG tablet TAKE 1 TABLET BY MOUTH EVERY DAY BEFORE BREAKFAST Patient taking differently: Take 75 mcg by mouth daily before breakfast. 06/06/22   Bradd Canary, MD  metFORMIN (GLUCOPHAGE) 500 MG tablet Take 500 mg by mouth at bedtime. 09/25/21   [provider]  pantoprazole (PROTONIX) 40 MG tablet TAKE 1 TABLET BY MOUTH TWICE A  DAY Patient taking differently: Take 40 mg by mouth 2 (two) times daily. 09/10/22   Cirigliano, Vito V, DO  pyridOXINE (VITAMIN B-6) 100 MG tablet Take 100 mg by mouth in the morning and at bedtime.    [provider]  tirzepatide Greggory Keen) 7.5 MG/0.5ML Pen Inject 7.5 mg into the skin once a week. 07/10/22   Sandford Craze, NP  venlafaxine XR (EFFEXOR-XR) 75 MG 24 hr capsule TAKE 1 CAPSULE BY MOUTH DAILY WITH BREAKFAST. 10/08/22   Bradd Canary, MD      Allergies    Patient has no known allergies.    Review of Systems   Review of Systems  Neurological:  Positive for headaches.  All other systems reviewed and are negative.   Physical Exam Updated Vital Signs BP 101/63   Pulse 78   Temp 98.3 F (36.8 C)   Resp 15   Ht 5\' 5"  (1.651 m)   Wt 86.2 kg   LMP 12/15/2010   SpO2 100%   BMI 31.62 kg/m  Physical Exam Vitals and nursing note reviewed.  Constitutional:      Appearance: She is well-developed. She is not ill-appearing, toxic-appearing or diaphoretic.     Comments: Appears uncomfortable.  HENT:     Head: Normocephalic and atraumatic.  Eyes:     Extraocular Movements: Extraocular movements intact.     Conjunctiva/sclera: Conjunctivae normal.     Pupils: Pupils are equal, round, and reactive to light.  Cardiovascular:     Rate  and Rhythm: Normal rate and regular rhythm.     Heart sounds: No murmur heard. Pulmonary:     Effort: Pulmonary effort is normal. No respiratory distress.     Breath sounds: Normal breath sounds.  Abdominal:     Palpations: Abdomen is soft.     Tenderness: There is no abdominal tenderness.  Musculoskeletal:        General: No swelling.     Cervical back: Normal range of motion and neck supple. No rigidity.  Skin:    General: Skin is warm and dry.     Capillary Refill: Capillary refill takes less than 2 seconds.  Neurological:     General: No focal deficit present.     Mental Status: She is alert and oriented to person, place, and  time.     Comments:   MENTAL STATUS: AAOx3   LANG/SPEECH: Fluent, intact naming, repetition & comprehension   CRANIAL NERVES:   II: Pupils equal and reactive   III, IV, VI: EOM intact, no gaze preference or deviation, no nystagmus   V: normal sensation of the face   VII: no facial asymmetry   VIII: normal hearing to speech   MOTOR: 5/5 in both upper and lower extremities   SENSORY: Normal to touch in all extremiteis   COORD: Normal finger to nose, heel to shin and shoulder shrug, no tremor, no dysmetria. No pronator drift   Psychiatric:        Mood and Affect: Mood normal.     ED Results / Procedures / Treatments   Labs (all labs ordered are listed, but only abnormal results are displayed) Labs Reviewed  BASIC METABOLIC PANEL - Abnormal; Notable for the following components:      Result Value   Sodium 134 (*)    Potassium 3.3 (*)    Glucose, Bld 111 (*)    Calcium 8.8 (*)    All other components within normal limits  CBC WITH DIFFERENTIAL/PLATELET - Abnormal; Notable for the following components:   WBC 13.6 (*)    RBC 5.24 (*)    Neutro Abs 12.0 (*)    All other components within normal limits  URINALYSIS, ROUTINE W REFLEX MICROSCOPIC - Abnormal; Notable for the following components:   Hgb urine dipstick SMALL (*)    Leukocytes,Ua TRACE (*)    All other components within normal limits  URINALYSIS, MICROSCOPIC (REFLEX) - Abnormal; Notable for the following components:   Bacteria, UA FEW (*)    All other components within normal limits  PREGNANCY, URINE    EKG None  Radiology CT Angio Head Neck W WO CM  Result Date: 12/21/2022 CLINICAL DATA:  headache, sudden, severe EXAM: CT ANGIOGRAPHY HEAD AND NECK WITH AND WITHOUT CONTRAST TECHNIQUE: Multidetector CT imaging of the head and neck was performed using the standard protocol during bolus administration of intravenous contrast. Multiplanar CT image reconstructions and MIPs were obtained to evaluate the vascular anatomy.  Carotid stenosis measurements (when applicable) are obtained utilizing NASCET criteria, using the distal internal carotid diameter as the denominator. RADIATION DOSE REDUCTION: This exam was performed according to the departmental dose-optimization program which includes automated exposure control, adjustment of the mA and/or kV according to patient size and/or use of iterative reconstruction technique. CONTRAST:  75mL OMNIPAQUE IOHEXOL 350 MG/ML SOLN COMPARISON:  None Available. FINDINGS: CT HEAD FINDINGS Brain: No evidence of acute infarction, hemorrhage, hydrocephalus, extra-axial collection or mass lesion/mass effect. Vascular: See below. Skull: No acute fracture. Sinuses/Orbits: No acute finding. Other: None.  Review of the MIP images confirms the above findings CTA NECK FINDINGS Aortic arch: Great vessel origins are patent without significant stenosis. Right carotid system: No evidence of dissection, stenosis (50% or greater), or occlusion. Left carotid system: No evidence of dissection, stenosis (50% or greater), or occlusion. Vertebral arteries: Codominant. No evidence of dissection, stenosis (50% or greater), or occlusion. Skeleton: No acute abnormality on limited assessment. Other neck: No acute abnormality on limited assessment. Upper chest: Visualized lung apices are clear. Review of the MIP images confirms the above findings CTA HEAD FINDINGS Anterior circulation: No significant stenosis, proximal occlusion, aneurysm, or vascular malformation. Posterior circulation: No significant stenosis, proximal occlusion, aneurysm, or vascular malformation. Venous sinuses: As permitted by contrast timing, patent. Review of the MIP images confirms the above findings IMPRESSION: 1. No large vessel occlusion or proximal hemodynamically significant stenosis. 2. No aneurysm identified. Electronically Signed   By: Feliberto Harts M.D.   On: 12/21/2022 14:53    Procedures Procedures    Medications Ordered in  ED Medications  sodium chloride 0.9 % bolus 1,000 mL (0 mLs Intravenous Stopped 12/21/22 1210)  metoCLOPramide (REGLAN) injection 10 mg (10 mg Intravenous Given 12/21/22 1105)  diphenhydrAMINE (BENADRYL) injection 12.5 mg (12.5 mg Intravenous Given 12/21/22 1104)  acetaminophen (TYLENOL) tablet 1,000 mg (1,000 mg Oral Given 12/21/22 1057)  iohexol (OMNIPAQUE) 350 MG/ML injection 75 mL (75 mLs Intravenous Contrast Given 12/21/22 1407)    ED Course/ Medical Decision Making/ A&P                             Medical Decision Making Amount and/or Complexity of Data Reviewed Labs: ordered. Radiology: ordered.  Risk OTC drugs. Prescription drug management.  This patient presents to the ED for concern of headache, this involves an extensive number of treatment options, and is a complaint that carries with it a high risk of complications and morbidity. Emergent considerations for headache include subarachnoid hemorrhage, meningitis, temporal arteritis, glaucoma, cerebral ischemia, carotid/vertebral dissection, intracranial tumor, Venous sinus thrombosis, carbon monoxide poisoning, acute or chronic subdural hemorrhage.  Other considerations include: Migraine, Cluster headache, Hypertension, Caffeine, alcohol, or drug withdrawal, Pseudotumor cerebri, Arteriovenous malformation, Head injury, Neurocysticercosis, Post-lumbar puncture, Preeclampsia, Tension headache, Sinusitis, Cervical arthritis, Refractive error causing strain, Dental abscess, Otitis media, Temporomandibular joint syndrome, Depression, Somatoform disorder (eg, somatization) Trigeminal neuralgia, Glossopharyngeal neuralgia.   Co morbidities that complicate the patient evaluation  anxiety, depression, migraine headaches, diabetes, hypothyroidism  My initial workup includes labs, imaging, symptom control  Additional history obtained from: Nursing notes from this visit. Family husband is at bedside and provides a portion of the history  I  ordered, reviewed and interpreted labs which include: BMP, CBC, urinalysis, UPT. Leukocytosis of 13.6, mild hyponatremia of 134, hypokalemia of 3.3, hyperglycemia of 111.   I ordered imaging studies including CTA head/neck I independently visualized and interpreted imaging which showed negative for acute abnormalities I agree with the radiologist interpretation  Afebrile, hemodynamically stable. 57 year old female presenting to the ED for evaluation of a headache. This began suddenly last night. Localized to the forehead. No other neurologic complaints. She appeared uncomfortable on initial exam but was much more comfortable after treatment. No neurological deficits. CTA head and neck reassuring. Given her history of sudden onset headache, SAH was considered. Do feel reassured against this after CTA head/neck, however would need to perform a lumbar puncture to definitively rule this out. I discussed the reasoning, risks and benefits of  an LP with the patient and her husband. She declines at this time. She reported full resolution of her symptoms in the ED. She was encouraged to follow up with her PCP within the next week for reevaluation. She was given strict return precautions. Stable at discharge.  At this time there does not appear to be any evidence of an acute emergency medical condition and the patient appears stable for discharge with appropriate outpatient follow up. Diagnosis was discussed with patient who verbalizes understanding of care plan and is agreeable to discharge. I have discussed return precautions with patient and husband who verbalizes understanding. Patient encouraged to follow-up with their PCP within 1 week. All questions answered.  Patient's case discussed with Dr. Jearld Fenton who agrees with plan to discharge with follow-up.   Note: Portions of this report may have been transcribed using voice recognition software. Every effort was made to ensure accuracy; however, inadvertent  computerized transcription errors may still be present.        Final Clinical Impression(s) / ED Diagnoses Final diagnoses:  Bad headache    Rx / DC Orders ED Discharge Orders     None         Michelle Piper, PA-C 12/21/22 1541    Loetta Rough, MD 12/21/22 1553

## 2022-12-26 ENCOUNTER — Ambulatory Visit (INDEPENDENT_AMBULATORY_CARE_PROVIDER_SITE_OTHER): Payer: Managed Care, Other (non HMO) | Admitting: Family Medicine

## 2022-12-26 ENCOUNTER — Other Ambulatory Visit: Payer: Self-pay | Admitting: Neurology

## 2022-12-26 VITALS — BP 125/84 | HR 106 | Ht 65.0 in | Wt 190.0 lb

## 2022-12-26 DIAGNOSIS — K146 Glossodynia: Secondary | ICD-10-CM | POA: Diagnosis not present

## 2022-12-26 DIAGNOSIS — H6690 Otitis media, unspecified, unspecified ear: Secondary | ICD-10-CM | POA: Diagnosis not present

## 2022-12-26 DIAGNOSIS — R519 Headache, unspecified: Secondary | ICD-10-CM

## 2022-12-26 MED ORDER — MAGIC MOUTHWASH: 5.0000 mL | Freq: Four times a day (QID) | ORAL | 1 refills | Status: DC | PRN

## 2022-12-26 MED ORDER — CYCLOBENZAPRINE HCL 5 MG PO TABS
5.0000 mg | ORAL_TABLET | Freq: Three times a day (TID) | ORAL | 1 refills | Status: DC | PRN
Start: 2022-12-26 — End: 2023-01-03

## 2022-12-26 MED ORDER — AMOXICILLIN-POT CLAVULANATE 875-125 MG PO TABS: 1.0000 | ORAL_TABLET | Freq: Two times a day (BID) | ORAL | 0 refills | Status: DC

## 2022-12-26 MED ORDER — MAGIC MOUTHWASH
5.0000 mL | Freq: Four times a day (QID) | ORAL | 1 refills | Status: DC | PRN
Start: 2022-12-26 — End: 2022-12-26

## 2022-12-26 NOTE — Progress Notes (Signed)
Acute Office Visit  Subjective:     Patient ID: Ebony [name], female    DOB: [birth year], 57 y.o.   MRN: [id number]  Chief Complaint  Patient presents with   Headache     Patient is in today for ER follow-up.   Discussed the use of AI scribe software for clinical note transcription with the patient, who gave verbal consent to proceed.  History of Present Illness   The patient, with a history of migraines, presented for an ER follow-up regarding a severe headache that began suddenly five days prior. The headache, described as excruciating, woke the patient from sleep and persisted despite attempts to manage it with ibuprofen and sinus medication. The pain was localized to the front of the head and intensified upon standing. The patient reported a sensation of pressure that lasted for about five seconds, which she described as a painful rush.  The patient sought emergency care, where a CT scan was performed and found to be clear. The patient was given Tylenol and a migraine cocktail, which provided temporary relief. ED provider offered LP to rule out ICH, but declined since improved while in the ED. However, the headache returned by evening and persisted despite further attempts to manage it with ibuprofen. The patient found that Tylenol provided relief for about six hours, after which the headache would return. The patient also reported experiencing a low-grade fever when the headache returned.  The patient denied experiencing sinus pain, coughing, or sneezing, but did report feeling slightly nauseous. She also reported a history of ringing in the ears, which had become louder since the onset of the headache. The patient described her hearing as being filtered or muffled. Her right ear ringing/muffled sound has been more noticeable lately.   The patient also reported having mouth sores that appeared suddenly the day before the consultation. She denied having eaten anything unusual and  reported no trouble swallowing, but noted that the sores were painful and rubbed against her teeth. The patient also reported having been under a significant amount of stress recently.  The patient had recently completed a course of Bactrim for a bacterial infection involving cysts in the anal area. She reported no other signs or symptoms of infection. The patient also reported having a history of neck pain, particularly on the right side, which had not worsened since the onset of the headache.  The patient reported feeling a bit foggy and had been spending time sitting on the couch, spacing out. She denied any confusion or other cognitive changes. The patient also reported that lying down did not significantly alleviate or worsen the headache.           All review of systems negative except what is listed in the HPI      Objective:    BP 125/84   Pulse (!) 106   Ht 5\' 5"  (1.651 m)   Wt 190 lb (86.2 kg)   LMP 12/15/2010   SpO2 97%   BMI 31.62 kg/m    Physical Exam Vitals reviewed.  Constitutional:      Appearance: She is well-developed.  HENT:     Head: Normocephalic and atraumatic.     Right Ear: Tympanic membrane is erythematous and bulging.  Eyes:     General: No visual field deficit.    Extraocular Movements:     Right eye: Normal extraocular motion and no nystagmus.     Left eye: Normal extraocular motion and no nystagmus.  Neck:  Comments: Right upper traps tight on palpation  Cardiovascular:     Rate and Rhythm: Normal rate and regular rhythm.     Heart sounds: Normal heart sounds.  Pulmonary:     Effort: Pulmonary effort is normal.     Breath sounds: Normal breath sounds.  Musculoskeletal:        General: Normal range of motion.     Cervical back: Normal range of motion and neck supple.  Skin:    General: Skin is warm and dry.  Neurological:     Mental Status: She is alert and oriented to person, place, and time. Mental status is at baseline.     GCS:  GCS eye subscore is 4. GCS verbal subscore is 5. GCS motor subscore is 6.     Cranial Nerves: No cranial nerve deficit, dysarthria or facial asymmetry.     Sensory: No sensory deficit.     Motor: No weakness.     Coordination: Coordination normal.     Gait: Gait normal.  Psychiatric:        Mood and Affect: Mood normal.        Behavior: Behavior normal.     No results found for any visits on 12/26/22.      Assessment & Plan:   Problem List Items Addressed This Visit     Headache - Primary Some improvement with Tylenol - okay to continue.  Adding Flexeril for upper trap tension. Given new, persistent headache unlike her typical migraine, will refer to neurology.  Patient aware of signs/symptoms requiring further/urgent evaluation.     Relevant Medications   cyclobenzaprine (FLEXERIL) 5 MG tablet   Other Relevant Orders   Ambulatory referral to Neurology   Other Visit Diagnoses     Acute otitis media, unspecified otitis media type     Right TM erythematous, bulging, and cloudy. Will treat with Augmentin, but not certain this is related to the new headache she has been having.    Relevant Medications   amoxicillin-clavulanate (AUGMENTIN) 875-125 MG tablet   Tongue sore   Trial magic mouthwash     Relevant Medications   magic mouthwash SOLN       Meds ordered this encounter  Medications   amoxicillin-clavulanate (AUGMENTIN) 875-125 MG tablet    Sig: Take 1 tablet by mouth 2 (two) times daily.    Dispense:  20 tablet    Refill:  0    Order Specific Question:   Supervising Provider    Answer:   Danise Edge A [[id number]]   cyclobenzaprine (FLEXERIL) 5 MG tablet    Sig: Take 1 tablet (5 mg total) by mouth 3 (three) times daily as needed for muscle spasms.    Dispense:  30 tablet    Refill:  1    Order Specific Question:   Supervising Provider    Answer:   Bradd Canary [[id number]]   DISCONTD: magic mouthwash SOLN    Sig: Take 5 mLs by mouth 4 (four) times daily as  needed for mouth pain. Swish and spit    Dispense:  240 mL    Refill:  1    ([facility]) Diphenhydramine (12.26ml/5ml) Nystatin (100,000 units/70ml), 30ml Hydrocortisone 60mg  tablet    Order Specific Question:   Supervising Provider    Answer:   Danise Edge A [[id number]]    Return if symptoms worsen or fail to improve. Already scheduled for routine f/u with PCP next week - reevaluate at that time.  Clayborne Dana, NP

## 2023-01-02 NOTE — Assessment & Plan Note (Signed)
Well controlled, no changes to meds. Encouraged heart healthy diet such as the DASH diet and exercise as tolerated.  °

## 2023-01-02 NOTE — Progress Notes (Unsigned)
Subjective:    Patient ID: Ebony [name], female    DOB: 05-28-1966, 57 y.o.   MRN: [id number]  No chief complaint on file.   HPI Discussed the use of AI scribe software for clinical note transcription with the patient, who gave verbal consent to proceed.  History of Present Illness  Patient is a 57 yo female in for follow up on chronic medical concerns. No recent febrile illness or hospitalizations. Denies CP/palp/SOB/HA/congestion/fevers/GI or GU c/o. Taking meds as prescribed           Past Medical History:  Diagnosis Date  . Arthritis    hips    . BCC (basal cell carcinoma), face    skin   . Concussion with no loss of consciousness 12/28/2014  . Depression with anxiety 10/06/2012  . Diabetes mellitus without complication (HCC)    no meds--- reports as borderline , last a1c 01-05-2019 with PCP 6.3%  . GERD (gastroesophageal reflux disease) 09/05/2015   MGD WITH PPI   . Headache(784.0)    otc meds prn, migraine, NOW 1-2 TIMES ANNUALLY   . Hyperlipidemia, mixed 12/28/2014   NOT ON ANY MEDICATIONS UPDATED 12/04/21  . Hypertension    only with preg. 31yrs ago 2002- no meds  . Hypothyroid 06/15/2012  . Obesity 09/05/2015  . Post-operative nausea and vomiting   . SVT (supraventricular tachycardia)   . Tinea pedis 12/28/2014    Past Surgical History:  Procedure Laterality Date  . ABDOMINAL HYSTERECTOMY  03/04/2012   Procedure: HYSTERECTOMY ABDOMINAL;  Surgeon: Zelphia Cairo, MD;  Location: [facility];  Service: Gynecology;  Laterality: N/A;  with lysis of adhesions  . BASAL CELL CARCINOMA EXCISION Left    cheek/face  . CESAREAN SECTION  2002   HELLP Syndrome/ one time  . COLONOSCOPY     SAME TIME AS ENDO   . DILATION AND CURETTAGE OF UTERUS  2007   Miscarriage  . FINGER SURGERY     2023 x 2 (ORIF and removal)  . HAND SURGERY Right    RING FINGER BROKEN 09/2021  . LAPAROSCOPIC LYSIS OF ADHESIONS N/A 02/17/2019   Procedure: LAPAROSCOPIC LYSIS OF ADHESIONS;   Surgeon: Adolphus Birchwood, MD;  Location: [facility];  Service: Gynecology;  Laterality: N/A;  . LAPAROSCOPIC TUBAL LIGATION  01/03/2011   Procedure: LAPAROSCOPIC TUBAL LIGATION;  Surgeon: Zelphia Cairo;  Location: [facility];  Service: Gynecology;  Laterality: Bilateral;  with filshie clips  . LAPAROSCOPY  03/04/2012   Procedure: LAPAROSCOPY DIAGNOSTIC;  Surgeon: Zelphia Cairo, MD;  Location: [facility];  Service: Gynecology;  Laterality: N/A;  with lysis of adhesions  . POLYPECTOMY    . ROBOTIC ASSISTED BILATERAL SALPINGO OOPHERECTOMY Bilateral 02/17/2019   Procedure: XI ROBOTIC ASSISTED BILATERAL SALPINGO OOPHORECTOMY;  Surgeon: Adolphus Birchwood, MD;  Location: [facility];  Service: Gynecology;  Laterality: Bilateral;  . SHOULDER ARTHROSCOPY  2012   right  . SVT ABLATION N/A 12/17/2018   Procedure: SVT ABLATION;  Surgeon: Marinus Maw, MD;  Location: [facility];  Service: Cardiovascular;  Laterality: N/A;  . TONSILLECTOMY    . TOTAL HIP ARTHROPLASTY Right    FEB 2021  . UPPER GI ENDOSCOPY      Family History  Problem Relation Age of Onset  . Hypertension Mother   . Depression Mother   . Breast cancer Mother 86  . Lung cancer Mother        lung (06-13-11)  . Basal cell carcinoma Brother 40  . Other Maternal Grandmother  spinal stenosis  . Emphysema Maternal Grandfather   . Prostate cancer Maternal Grandfather   . Breast cancer Cousin 40  . Colon cancer Neg Hx   . Esophageal cancer Neg Hx   . Pancreatic cancer Neg Hx   . Stomach cancer Neg Hx   . Colon polyps Neg Hx   . Crohn's disease Neg Hx   . Rectal cancer Neg Hx     Social History   Socioeconomic History  . Marital status: Married    Spouse name: Not on file  . Number of children: 1  . Years of education: Not on file  . 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 Smoking status: Never    Passive exposure: Past (BOTH PARENTS SMOKED)  . Smokeless  tobacco: Never  Vaping Use  . Vaping status: Never Used  Substance and Sexual Activity  . Alcohol use: Yes    Comment: occasionally   . Drug use: No  . Sexual activity: Yes    Partners: Male    Birth control/protection: Surgical  Other Topics Concern  . 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 Difficulty of Paying Living Expenses: Not hard at all  Food Insecurity: No Food Insecurity (12/26/2022)   Hunger Vital Sign   . Worried About Programme researcher, broadcasting/film/video in the Last Year: Never true   . Ran Out of Food in the Last Year: Never true  Transportation Needs: No Transportation Needs (12/26/2022)   PRAPARE - Transportation   . Lack of Transportation (Medical): No   . Lack of Transportation (Non-Medical): No  Physical Activity: Unknown (12/26/2022)   Exercise Vital Sign   . Days of Exercise per Week: 0 days   . Minutes of Exercise per Session: Not on file  Stress: No Stress Concern Present (12/26/2022)   [employer] of Occupational Health - Occupational Stress Questionnaire   . Feeling of Stress : Only a little  Social Connections: Socially Integrated (12/26/2022)   Social Connection and Isolation Panel [NHANES]   . Frequency of Communication with Friends and Family: More than three times a week   . Frequency of Social Gatherings with Friends and Family: More than three times a week   . Attends Religious Services: More than 4 times per year   . Active Member of Clubs or Organizations: Yes   . Attends Banker Meetings: More than 4 times per year   . Marital Status: Married  Catering manager Violence: Not on file    Outpatient Medications Prior to Visit  Medication Sig Dispense Refill  . amoxicillin-clavulanate  (AUGMENTIN) 875-125 MG tablet Take 1 tablet by mouth 2 (two) times daily. 20 tablet 0  . Ascorbic Acid (VITAMIN C) 1000 MG tablet Take 1,000 mg by mouth every evening.    . Cholecalciferol (VITAMIN D) 50 MCG (2000 UT) tablet Take 2,000 Units by mouth daily.    . cyclobenzaprine (FLEXERIL) 5 MG tablet Take 1 tablet (5 mg total) by mouth 3 (three) times daily as needed for muscle spasms. 30 tablet 1  . levothyroxine (SYNTHROID) 75 MCG tablet TAKE 1 TABLET BY MOUTH EVERY DAY BEFORE  BREAKFAST (Patient taking differently: Take 75 mcg by mouth daily before breakfast.) 90 tablet 3  . magic mouthwash SOLN Take 5 mLs by mouth 4 (four) times daily as needed for mouth pain. Swish and spit 240 mL 1  . pantoprazole (PROTONIX) 40 MG tablet TAKE 1 TABLET BY MOUTH TWICE A DAY (Patient taking differently: Take 40 mg by mouth 2 (two) times daily.) 180 tablet 0  . tirzepatide (MOUNJARO) 7.5 MG/0.5ML Pen Inject 7.5 mg into the skin once a week. 6 mL   . venlafaxine XR (EFFEXOR-XR) 75 MG 24 hr capsule TAKE 1 CAPSULE BY MOUTH DAILY WITH BREAKFAST. 90 capsule 1   No facility-administered medications prior to visit.    No Known Allergies  Review of Systems  Constitutional:  Negative for fever and malaise/fatigue.  HENT:  Negative for congestion.   Eyes:  Negative for blurred vision.  Respiratory:  Negative for shortness of breath.   Cardiovascular:  Negative for chest pain, palpitations and leg swelling.  Gastrointestinal:  Negative for abdominal pain, blood in stool and nausea.  Genitourinary:  Negative for dysuria and frequency.  Musculoskeletal:  Negative for falls.  Skin:  Negative for rash.  Neurological:  Negative for dizziness, loss of consciousness and headaches.  Endo/Heme/Allergies:  Negative for environmental allergies.  Psychiatric/Behavioral:  Negative for depression. The patient is not nervous/anxious.       Objective:    Physical Exam Constitutional:      General: She is not in acute  distress.    Appearance: Normal appearance. She is well-developed. She is not toxic-appearing.  HENT:     Head: Normocephalic and atraumatic.     Right Ear: External ear normal.     Left Ear: External ear normal.     Nose: Nose normal.  Eyes:     General:        Right eye: No discharge.        Left eye: No discharge.     Conjunctiva/sclera: Conjunctivae normal.  Neck:     Thyroid: No thyromegaly.  Cardiovascular:     Rate and Rhythm: Normal rate and regular rhythm.     Heart sounds: Normal heart sounds. No murmur heard. Pulmonary:     Effort: Pulmonary effort is normal. No respiratory distress.     Breath sounds: Normal breath sounds.  Abdominal:     General: Bowel sounds are normal.     Palpations: Abdomen is soft.     Tenderness: There is no abdominal tenderness. There is no guarding.  Musculoskeletal:        General: Normal range of motion.     Cervical back: Neck supple.  Lymphadenopathy:     Cervical: No cervical adenopathy.  Skin:    General: Skin is warm and dry.  Neurological:     Mental Status: She is alert and oriented to person, place, and time.  Psychiatric:        Mood and Affect: Mood normal.        Behavior: Behavior normal.        Thought Content: Thought content normal.        Judgment: Judgment normal.   LMP 12/15/2010  Wt Readings from Last 3 Encounters:  12/26/22 190 lb (86.2 kg)  12/21/22 190 lb (86.2 kg)  10/23/22 185 lb (83.9 kg)    Diabetic Foot Exam - Simple   No data filed    Lab Results  Component Value Date   WBC 13.6 (H) 12/21/2022   HGB 14.1 12/21/2022  HCT 42.0 12/21/2022   PLT 333 12/21/2022   GLUCOSE 111 (H) 12/21/2022   CHOL 167 07/04/2022   TRIG 110.0 07/04/2022   HDL 48.70 07/04/2022   LDLCALC 96 07/04/2022   ALT 15 07/04/2022   AST 19 07/04/2022   NA 134 (L) 12/21/2022   K 3.3 (L) 12/21/2022   CL 104 12/21/2022   CREATININE 0.83 12/21/2022   BUN 15 12/21/2022   CO2 22 12/21/2022   TSH 1.35 07/04/2022   HGBA1C  5.8 07/04/2022   MICROALBUR 1.8 03/22/2016    Lab Results  Component Value Date   TSH 1.35 07/04/2022   Lab Results  Component Value Date   WBC 13.6 (H) 12/21/2022   HGB 14.1 12/21/2022   HCT 42.0 12/21/2022   MCV 80.2 12/21/2022   PLT 333 12/21/2022   Lab Results  Component Value Date   NA 134 (L) 12/21/2022   K 3.3 (L) 12/21/2022   CO2 22 12/21/2022   GLUCOSE 111 (H) 12/21/2022   BUN 15 12/21/2022   CREATININE 0.83 12/21/2022   BILITOT 0.4 07/04/2022   ALKPHOS 64 07/04/2022   AST 19 07/04/2022   ALT 15 07/04/2022   PROT 7.3 07/04/2022   ALBUMIN 4.4 07/04/2022   CALCIUM 8.8 (L) 12/21/2022   ANIONGAP 8 12/21/2022   GFR 81.09 07/04/2022   Lab Results  Component Value Date   CHOL 167 07/04/2022   Lab Results  Component Value Date   HDL 48.70 07/04/2022   Lab Results  Component Value Date   LDLCALC 96 07/04/2022   Lab Results  Component Value Date   TRIG 110.0 07/04/2022   Lab Results  Component Value Date   CHOLHDL 3 07/04/2022   Lab Results  Component Value Date   HGBA1C 5.8 07/04/2022       Assessment & Plan:  Essential hypertension Assessment & Plan: Well controlled, no changes to meds. Encouraged heart healthy diet such as the DASH diet and exercise as tolerated.    Depression with anxiety Assessment & Plan: Doing well on Venlafaxine and uses Clonazepam infrequently but with her ongoing stress of her son's brain tumor. He is doing well but we will keep the medication in her chart in case she needs it for any reason.    Hyperlipidemia, mixed Assessment & Plan: Tolerating statin, encouraged heart healthy diet, avoid trans fats, minimize simple carbs and saturated fats. Increase exercise as tolerated   Hypothyroidism, unspecified type Assessment & Plan: On Levothyroxine, continue to monitor   Class 2 obesity due to excess calories with body mass index (BMI) of 37.0 to 37.9 in adult, unspecified whether serious comorbidity  present Assessment & Plan: Encouraged DASH or MIND diet, decrease po intake and increase exercise as tolerated. Needs 7-8 hours of sleep nightly. Avoid trans fats, eat small, frequent meals every 4-5 hours with lean proteins, complex carbs and healthy fats. Minimize simple carbs, high fat foods and processed foods. Is tolerating Mounjaro and having good success with weight loss.    Type 2 diabetes mellitus without complication, without long-term current use of insulin (HCC) Assessment & Plan: hgba1c acceptable, minimize simple carbs. Increase exercise as tolerated. Continue current meds     Assessment and Plan              Danise Edge, MD

## 2023-01-02 NOTE — Assessment & Plan Note (Signed)
hgba1c acceptable, minimize simple carbs. Increase exercise as tolerated. Continue current meds 

## 2023-01-02 NOTE — Assessment & Plan Note (Signed)
Encouraged DASH or MIND diet, decrease po intake and increase exercise as tolerated. Needs 7-8 hours of sleep nightly. Avoid trans fats, eat small, frequent meals every 4-5 hours with lean proteins, complex carbs and healthy fats. Minimize simple carbs, high fat foods and processed foods. Is tolerating Mounjaro and having good success with weight loss.

## 2023-01-02 NOTE — Assessment & Plan Note (Signed)
Tolerating statin, encouraged heart healthy diet, avoid trans fats, minimize simple carbs and saturated fats. Increase exercise as tolerated 

## 2023-01-02 NOTE — Assessment & Plan Note (Signed)
On Levothyroxine, continue to monitor 

## 2023-01-02 NOTE — Assessment & Plan Note (Signed)
Doing well on Venlafaxine and uses Clonazepam infrequently but with her ongoing stress of her son's brain tumor. He is doing well but we will keep the medication in her chart in case she needs it for any reason.

## 2023-01-03 ENCOUNTER — Ambulatory Visit (INDEPENDENT_AMBULATORY_CARE_PROVIDER_SITE_OTHER): Payer: Managed Care, Other (non HMO) | Admitting: Family Medicine

## 2023-01-03 ENCOUNTER — Encounter: Payer: Self-pay | Admitting: Family Medicine

## 2023-01-03 VITALS — BP 110/70 | HR 82 | Temp 97.8°F | Resp 16 | Ht 65.0 in | Wt 191.8 lb

## 2023-01-03 DIAGNOSIS — I10 Essential (primary) hypertension: Secondary | ICD-10-CM

## 2023-01-03 DIAGNOSIS — H9190 Unspecified hearing loss, unspecified ear: Secondary | ICD-10-CM

## 2023-01-03 DIAGNOSIS — H9313 Tinnitus, bilateral: Secondary | ICD-10-CM

## 2023-01-03 DIAGNOSIS — E6609 Other obesity due to excess calories: Secondary | ICD-10-CM

## 2023-01-03 DIAGNOSIS — E039 Hypothyroidism, unspecified: Secondary | ICD-10-CM | POA: Diagnosis not present

## 2023-01-03 DIAGNOSIS — R110 Nausea: Secondary | ICD-10-CM

## 2023-01-03 DIAGNOSIS — Z6837 Body mass index (BMI) 37.0-37.9, adult: Secondary | ICD-10-CM

## 2023-01-03 DIAGNOSIS — E782 Mixed hyperlipidemia: Secondary | ICD-10-CM

## 2023-01-03 DIAGNOSIS — R519 Headache, unspecified: Secondary | ICD-10-CM | POA: Diagnosis not present

## 2023-01-03 DIAGNOSIS — Z79899 Other long term (current) drug therapy: Secondary | ICD-10-CM

## 2023-01-03 DIAGNOSIS — Z7985 Long-term (current) use of injectable non-insulin antidiabetic drugs: Secondary | ICD-10-CM

## 2023-01-03 DIAGNOSIS — E119 Type 2 diabetes mellitus without complications: Secondary | ICD-10-CM | POA: Diagnosis not present

## 2023-01-03 DIAGNOSIS — F418 Other specified anxiety disorders: Secondary | ICD-10-CM | POA: Diagnosis not present

## 2023-01-03 LAB — CBC WITH DIFFERENTIAL/PLATELET
Basophils Absolute: 0.1 10*3/uL (ref 0.0–0.1)
Basophils Relative: 0.7 % (ref 0.0–3.0)
Eosinophils Absolute: 0.2 10*3/uL (ref 0.0–0.7)
Eosinophils Relative: 1.9 % (ref 0.0–5.0)
HCT: 41.7 % (ref 36.0–46.0)
Hemoglobin: 13.5 g/dL (ref 12.0–15.0)
Lymphocytes Relative: 44 % (ref 12.0–46.0)
Lymphs Abs: 3.7 10*3/uL (ref 0.7–4.0)
MCHC: 32.5 g/dL (ref 30.0–36.0)
MCV: 80.8 fl (ref 78.0–100.0)
Monocytes Absolute: 0.5 10*3/uL (ref 0.1–1.0)
Monocytes Relative: 5.8 % (ref 3.0–12.0)
Neutro Abs: 4 10*3/uL (ref 1.4–7.7)
Neutrophils Relative %: 47.6 % (ref 43.0–77.0)
Platelets: 412 10*3/uL — ABNORMAL HIGH (ref 150.0–400.0)
RBC: 5.16 Mil/uL — ABNORMAL HIGH (ref 3.87–5.11)
RDW: 14.2 % (ref 11.5–15.5)
WBC: 8.5 10*3/uL (ref 4.0–10.5)

## 2023-01-03 LAB — SEDIMENTATION RATE: Sed Rate: 21 mm/hr (ref 0–30)

## 2023-01-03 LAB — LIPID PANEL
Cholesterol: 227 mg/dL — ABNORMAL HIGH (ref 0–200)
HDL: 40.5 mg/dL (ref >39.00)
NonHDL: 186.18
Total CHOL/HDL Ratio: 6
Triglycerides: 287 mg/dL — ABNORMAL HIGH (ref 0.0–149.0)
VLDL: 57.4 mg/dL — ABNORMAL HIGH (ref 0.0–40.0)

## 2023-01-03 LAB — COMPREHENSIVE METABOLIC PANEL
ALT: 27 U/L (ref 0–35)
AST: 21 U/L (ref 0–37)
Albumin: 4.5 g/dL (ref 3.5–5.2)
Alkaline Phosphatase: 77 U/L (ref 39–117)
BUN: 13 mg/dL (ref 6–23)
CO2: 26 mEq/L (ref 19–32)
Calcium: 9.6 mg/dL (ref 8.4–10.5)
Chloride: 104 mEq/L (ref 96–112)
Creatinine, Ser: 0.72 mg/dL (ref 0.40–1.20)
GFR: 94.45 mL/min (ref >60.00)
Glucose, Bld: 102 mg/dL — ABNORMAL HIGH (ref 70–99)
Potassium: 4 mEq/L (ref 3.5–5.1)
Sodium: 139 mEq/L (ref 135–145)
Total Bilirubin: 0.5 mg/dL (ref 0.2–1.2)
Total Protein: 7.6 g/dL (ref 6.0–8.3)

## 2023-01-03 LAB — HEMOGLOBIN A1C: Hgb A1c MFr Bld: 5.8 % (ref 4.6–6.5)

## 2023-01-03 LAB — LDL CHOLESTEROL, DIRECT: Direct LDL: 142 mg/dL

## 2023-01-03 LAB — TSH: TSH: 2.15 u[IU]/mL (ref 0.35–5.50)

## 2023-01-03 MED ORDER — PROMETHAZINE HCL 25 MG PO TABS: 25.0000 mg | ORAL_TABLET | Freq: Three times a day (TID) | ORAL | 0 refills | Status: DC | PRN

## 2023-01-03 MED ORDER — SUMATRIPTAN SUCCINATE 50 MG PO TABS: 50.0000 mg | ORAL_TABLET | ORAL | 0 refills | Status: AC | PRN

## 2023-01-03 MED ORDER — TIZANIDINE HCL 2 MG PO TABS: 1.0000 mg | ORAL_TABLET | Freq: Three times a day (TID) | ORAL | 0 refills | Status: DC | PRN

## 2023-01-03 NOTE — Patient Instructions (Signed)

## 2023-01-09 ENCOUNTER — Ambulatory Visit (HOSPITAL_BASED_OUTPATIENT_CLINIC_OR_DEPARTMENT_OTHER)
Admission: RE | Admit: 2023-01-09 | Discharge: 2023-01-09 | Disposition: A | Discharge status: Home / Self Care | Payer: Managed Care, Other (non HMO) | Source: Ambulatory Visit | Attending: Family Medicine | Admitting: Family Medicine

## 2023-01-09 DIAGNOSIS — E119 Type 2 diabetes mellitus without complications: Secondary | ICD-10-CM | POA: Insufficient documentation

## 2023-01-09 DIAGNOSIS — H9190 Unspecified hearing loss, unspecified ear: Secondary | ICD-10-CM | POA: Insufficient documentation

## 2023-01-09 DIAGNOSIS — I10 Essential (primary) hypertension: Secondary | ICD-10-CM | POA: Insufficient documentation

## 2023-01-09 DIAGNOSIS — R519 Headache, unspecified: Secondary | ICD-10-CM | POA: Diagnosis present

## 2023-01-09 DIAGNOSIS — R110 Nausea: Secondary | ICD-10-CM | POA: Insufficient documentation

## 2023-01-09 DIAGNOSIS — H9313 Tinnitus, bilateral: Secondary | ICD-10-CM | POA: Diagnosis present

## 2023-01-09 MED ORDER — GADOBUTROL 1 MMOL/ML IV SOLN
7.5000 mL | Freq: Once | INTRAVENOUS | Status: AC | PRN
  Administered 2023-01-09: 7.5 mL via INTRAVENOUS

## 2023-03-02 ENCOUNTER — Encounter: Payer: Self-pay | Admitting: Family Medicine

## 2023-03-18 NOTE — Telephone Encounter (Signed)
Pt called to follow up. Please advise.

## 2023-03-18 NOTE — Telephone Encounter (Signed)
Forms was signed and sent.

## 2023-03-19 ENCOUNTER — Other Ambulatory Visit: Payer: Self-pay

## 2023-03-19 DIAGNOSIS — E782 Mixed hyperlipidemia: Secondary | ICD-10-CM

## 2023-03-19 NOTE — Telephone Encounter (Signed)
Pt called to schedule lab appt. It doesn't look like the orders were placed? Please advise when ready and I will call pt back to schedule.

## 2023-03-19 NOTE — Telephone Encounter (Signed)
Lab appt scheduled and labs ordered

## 2023-03-25 ENCOUNTER — Ambulatory Visit: Payer: Managed Care, Other (non HMO) | Admitting: Neurology

## 2023-04-03 ENCOUNTER — Other Ambulatory Visit: Payer: Managed Care, Other (non HMO)

## 2023-04-04 ENCOUNTER — Other Ambulatory Visit: Payer: Managed Care, Other (non HMO)

## 2023-04-12 ENCOUNTER — Other Ambulatory Visit (INDEPENDENT_AMBULATORY_CARE_PROVIDER_SITE_OTHER): Payer: Managed Care, Other (non HMO)

## 2023-04-12 DIAGNOSIS — E782 Mixed hyperlipidemia: Secondary | ICD-10-CM | POA: Diagnosis not present

## 2023-04-12 LAB — LIPID PANEL
Cholesterol: 238 mg/dL — ABNORMAL HIGH (ref 0–200)
HDL: 52.9 mg/dL (ref >39.00)
LDL Cholesterol: 146 mg/dL — ABNORMAL HIGH (ref 0–99)
NonHDL: 184.67
Total CHOL/HDL Ratio: 4
Triglycerides: 191 mg/dL — ABNORMAL HIGH (ref 0.0–149.0)
VLDL: 38.2 mg/dL (ref 0.0–40.0)

## 2023-04-21 ENCOUNTER — Other Ambulatory Visit: Payer: Self-pay | Admitting: Family Medicine

## 2023-05-09 NOTE — Progress Notes (Addendum)
COVID Vaccine Completed: yes  Date of COVID positive in last 90 days: no  PCP - Danise Edge, MD LOV 01/03/23 Cardiologist - Olga Millers, MD Electrophysiologist- Lewayne Bunting, MD  Chest x-ray - n/a EKG - 05/21/23 Epic/chart Stress Test - n/a ECHO - 05/14/18 Epic  Cardiac Cath - ablation for SVT Pacemaker/ICD device last checked: n/a Spinal Cord Stimulator:n/a  Bowel Prep - no  Sleep Study - n/a CPAP -   Fasting Blood Sugar - yes type 2 Checks Blood Sugar no BS checks at home  Last dose of GLP1 agonist-  Mounjaro, takes thursdays GLP1 instructions:  Hold 7 days before surgery, do not take 05/30/23. Last dose 05/23/23   Last dose of SGLT-2 inhibitors-  N/A SGLT-2 instructions:  Hold 3 days before surgery    Blood Thinner Instructions:  Time Aspirin Instructions: Last Dose:  Activity level: Can go up a flight of stairs and perform activities of daily living without stopping and without symptoms of chest pain or shortness of breath.    Anesthesia review: SVT, DM2, HTN, mitral valve prolapse   Patient denies shortness of breath, fever, cough and chest pain at PAT appointment  Patient verbalized understanding of instructions that were given to them at the PAT appointment. Patient was also instructed that they will need to review over the PAT instructions again at home before surgery.

## 2023-05-09 NOTE — Patient Instructions (Addendum)
SURGICAL WAITING ROOM VISITATION  Patients having surgery or a procedure may have no more than 2 support people in the waiting area - these visitors may rotate.    Children under the age of 44 must have an adult with them who is not the patient.  Due to an increase in RSV and influenza rates and associated hospitalizations, children ages 20 and under may not visit patients in [facility] hospitals.  If the patient needs to stay at the hospital during part of their recovery, the visitor guidelines for inpatient rooms apply. Pre-op nurse will coordinate an appropriate time for 1 support person to accompany patient in pre-op.  This support person may not rotate.    Please refer to the [facility] website for the visitor guidelines for Inpatients (after your surgery is over and you are in a regular room).    Your procedure is scheduled on: 06/03/23   Report to [facility] Main Entrance    Report to admitting at 9:15 AM   Call this number if you have problems the morning of surgery [phone]   Do not eat food :After Midnight.   After Midnight you may have the following liquids until 8:45 AM DAY OF SURGERY  Water Non-Citrus Juices (without pulp, NO RED-Apple, White grape, White cranberry) Black Coffee (NO MILK/CREAM OR CREAMERS, sugar ok)  Clear Tea (NO MILK/CREAM OR CREAMERS, sugar ok) regular and decaf                             Plain Jell-O (NO RED)                                           Fruit ices (not with fruit pulp, NO RED)                                     Popsicles (NO RED)                                                               Sports drinks like Gatorade (NO RED)                 The day of surgery:  Drink ONE (1) Pre-Surgery Clear Ensure at 8:45 AM the morning of surgery. Drink in one sitting. Do not sip.  This drink was given to you during your hospital  pre-op appointment visit. Nothing else to drink after completing the  Pre-Surgery Clear  Ensure.          If you have questions, please contact your surgeon's office.   FOLLOW BOWEL PREP AND ANY ADDITIONAL PRE OP INSTRUCTIONS YOU RECEIVED FROM YOUR SURGEON'S OFFICE!!!     Oral Hygiene is also important to reduce your risk of infection.                                    Remember - BRUSH YOUR TEETH THE MORNING OF SURGERY WITH YOUR REGULAR TOOTHPASTE  DENTURES WILL  BE REMOVED PRIOR TO SURGERY PLEASE DO NOT APPLY "Poly grip" OR ADHESIVES!!!   Stop all vitamins and herbal supplements 7 days before surgery.   Take these medicines the morning of surgery with A SIP OF WATER: Levothyroxine, Venlafaxine   How to Manage Your Diabetes Before and After Surgery  Why is it important to control my blood sugar before and after surgery? Improving blood sugar levels before and after surgery helps healing and can limit problems. A way of improving blood sugar control is eating a healthy diet by:  Eating less sugar and carbohydrates  Increasing activity/exercise  Talking with your doctor about reaching your blood sugar goals High blood sugars (greater than 180 mg/dL) can raise your risk of infections and slow your recovery, so you will need to focus on controlling your diabetes during the weeks before surgery. Make sure that the doctor who takes care of your diabetes knows about your planned surgery including the date and location.  How do I manage my blood sugar before surgery? Check your blood sugar at least 4 times a day, starting 2 days before surgery, to make sure that the level is not too high or low. Check your blood sugar the morning of your surgery when you wake up and every 2 hours until you get to the Short Stay unit. If your blood sugar is less than 70 mg/dL, you will need to treat for low blood sugar: Do not take insulin. Treat a low blood sugar (less than 70 mg/dL) with  cup of clear juice (cranberry or apple), 4 glucose tablets, OR glucose gel. Recheck blood sugar in 15  minutes after treatment (to make sure it is greater than 70 mg/dL). If your blood sugar is not greater than 70 mg/dL on recheck, call [phone] for further instructions. Report your blood sugar to the short stay nurse when you get to Short Stay.  If you are admitted to the hospital after surgery: Your blood sugar will be checked by the staff and you will probably be given insulin after surgery (instead of oral diabetes medicines) to make sure you have good blood sugar levels. The goal for blood sugar control after surgery is 80-180 mg/dL.   WHAT DO I DO ABOUT MY DIABETES MEDICATION?  Do not take oral diabetes medicines (pills) the morning of surgery.  Last dose of Mounjaro 05/23/23, do not take 05/30/23.  DO NOT TAKE THE FOLLOWING 7 DAYS PRIOR TO SURGERY: Ozempic, Wegovy, Rybelsus (Semaglutide), Byetta (exenatide), Bydureon (exenatide ER), Victoza, Saxenda (liraglutide), or Trulicity (dulaglutide) Mounjaro (Tirzepatide) Adlyxin (Lixisenatide), Polyethylene Glycol Loxenatide.  Reviewed and Endorsed by [facility] Patient Education Committee, August 2015                              You may not have any metal on your body including hair pins, jewelry, and body piercing             Do not wear make-up, lotions, powders, perfumes, or deodorant  Do not wear nail polish including gel and S&S, artificial/acrylic nails, or any other type of covering on natural nails including finger and toenails. If you have artificial nails, gel coating, etc. that needs to be removed by a nail salon please have this removed prior to surgery or surgery may need to be canceled/ delayed if the surgeon/ anesthesia feels like they are unable to be safely monitored.   Do not shave  48 hours prior to  surgery.    Do not bring valuables to the hospital. [facility] IS NOT             RESPONSIBLE   FOR VALUABLES.   Contacts, glasses, dentures or bridgework may not be worn into surgery.   Bring small overnight bag  day of surgery.   DO NOT BRING YOUR HOME MEDICATIONS TO THE HOSPITAL. PHARMACY WILL DISPENSE MEDICATIONS LISTED ON YOUR MEDICATION LIST TO YOU DURING YOUR ADMISSION IN THE HOSPITAL!   Special Instructions: Bring a copy of your healthcare power of attorney and living will documents the day of surgery if you haven't scanned them before.              Please read over the following fact sheets you were given: IF YOU HAVE QUESTIONS ABOUT YOUR PRE-OP INSTRUCTIONS PLEASE CALL [phone] [place]    If you received a COVID test during your pre-op visit  it is requested that you wear a mask when out in public, stay away from anyone that may not be feeling well and notify your surgeon if you develop symptoms. If you test positive for Covid or have been in contact with anyone that has tested positive in the last 10 days please notify you surgeon.      Pre-operative 5 CHG Bath Instructions   You can play a key role in reducing the risk of infection after surgery. Your skin needs to be as free of germs as possible. You can reduce the number of germs on your skin by washing with CHG (chlorhexidine gluconate) soap before surgery. CHG is an antiseptic soap that kills germs and continues to kill germs even after washing.   DO NOT use if you have an allergy to chlorhexidine/CHG or antibacterial soaps. If your skin becomes reddened or irritated, stop using the CHG and notify one of our RNs at [phone].   Please shower with the CHG soap starting 4 days before surgery using the following schedule:     Please keep in mind the following:  DO NOT shave, including legs and underarms, starting the day of your first shower.   You may shave your face at any point before/day of surgery.  Place clean sheets on your bed the day you start using CHG soap. Use a clean washcloth (not used since being washed) for each shower. DO NOT sleep with pets once you start using the CHG.   CHG Shower Instructions:  If you  choose to wash your hair and private area, wash first with your normal shampoo/soap.  After you use shampoo/soap, rinse your hair and body thoroughly to remove shampoo/soap residue.  Turn the water OFF and apply about 3 tablespoons (45 ml) of CHG soap to a CLEAN washcloth.  Apply CHG soap ONLY FROM YOUR NECK DOWN TO YOUR TOES (washing for 3-5 minutes)  DO NOT use CHG soap on face, private areas, open wounds, or sores.  Pay special attention to the area where your surgery is being performed.  If you are having back surgery, having someone wash your back for you may be helpful. Wait 2 minutes after CHG soap is applied, then you may rinse off the CHG soap.  Pat dry with a clean towel  Put on clean clothes/pajamas   If you choose to wear lotion, please use ONLY the CHG-compatible lotions on the back of this paper.     Additional instructions for the day of surgery: DO NOT APPLY any lotions, deodorants, cologne, or perfumes.  Put on clean/comfortable clothes.  Brush your teeth.  Ask your nurse before applying any prescription medications to the skin.      CHG Compatible Lotions   Aveeno Moisturizing lotion  Cetaphil Moisturizing Cream  Cetaphil Moisturizing Lotion  Clairol Herbal Essence Moisturizing Lotion, Dry Skin  Clairol Herbal Essence Moisturizing Lotion, Extra Dry Skin  Clairol Herbal Essence Moisturizing Lotion, Normal Skin  Curel Age Defying Therapeutic Moisturizing Lotion with Alpha Hydroxy  Curel Extreme Care Body Lotion  Curel Soothing Hands Moisturizing Hand Lotion  Curel Therapeutic Moisturizing Cream, Fragrance-Free  Curel Therapeutic Moisturizing Lotion, Fragrance-Free  Curel Therapeutic Moisturizing Lotion, Original Formula  Eucerin Daily Replenishing Lotion  Eucerin Dry Skin Therapy Plus Alpha Hydroxy Crme  Eucerin Dry Skin Therapy Plus Alpha Hydroxy Lotion  Eucerin Original Crme  Eucerin Original Lotion  Eucerin Plus Crme Eucerin Plus Lotion  Eucerin  TriLipid Replenishing Lotion  Keri Anti-Bacterial Hand Lotion  Keri Deep Conditioning Original Lotion Dry Skin Formula Softly Scented  Keri Deep Conditioning Original Lotion, Fragrance Free Sensitive Skin Formula  Keri Lotion Fast Absorbing Fragrance Free Sensitive Skin Formula  Keri Lotion Fast Absorbing Softly Scented Dry Skin Formula  Keri Original Lotion  Keri Skin Renewal Lotion Keri Silky Smooth Lotion  Keri Silky Smooth Sensitive Skin Lotion  Nivea Body Creamy Conditioning Oil  Nivea Body Extra Enriched Teacher, adult education Moisturizing Lotion Nivea Crme  Nivea Skin Firming Lotion  NutraDerm 30 Skin Lotion  NutraDerm Skin Lotion  NutraDerm Therapeutic Skin Cream  NutraDerm Therapeutic Skin Lotion  ProShield Protective Hand Cream  Provon moisturizing lotion  WHAT IS A BLOOD TRANSFUSION? Blood Transfusion Information  A transfusion is the replacement of blood or some of its parts. Blood is made up of multiple cells which provide different functions. Red blood cells carry oxygen and are used for blood loss replacement. White blood cells fight against infection. Platelets control bleeding. Plasma helps clot blood. Other blood products are available for specialized needs, such as hemophilia or other clotting disorders. BEFORE THE TRANSFUSION  Who gives blood for transfusions?  Healthy volunteers who are fully evaluated to make sure their blood is safe. This is blood bank blood. Transfusion therapy is the safest it has ever been in the practice of medicine. Before blood is taken from a donor, a complete history is taken to make sure that person has no history of diseases nor engages in risky social behavior (examples are intravenous drug use or sexual activity with multiple partners). The donor's travel history is screened to minimize risk of transmitting infections, such as malaria. The donated blood is tested for signs of infectious diseases, such as  HIV and hepatitis. The blood is then tested to be sure it is compatible with you in order to minimize the chance of a transfusion reaction. If you or a relative donates blood, this is often done in anticipation of surgery and is not appropriate for emergency situations. It takes many days to process the donated blood. RISKS AND COMPLICATIONS Although transfusion therapy is very safe and saves many lives, the main dangers of transfusion include:  Getting an infectious disease. Developing a transfusion reaction. This is an allergic reaction to something in the blood you were given. Every precaution is taken to prevent this. The decision to have a blood transfusion has been considered carefully by your caregiver before blood is given. Blood is not given unless the benefits outweigh the risks. AFTER THE TRANSFUSION Right after receiving a blood  transfusion, you will usually feel much better and more energetic. This is especially true if your red blood cells have gotten low (anemic). The transfusion raises the level of the red blood cells which carry oxygen, and this usually causes an energy increase. The nurse administering the transfusion will monitor you carefully for complications. HOME CARE INSTRUCTIONS  No special instructions are needed after a transfusion. You may find your energy is better. Speak with your caregiver about any limitations on activity for underlying diseases you may have. SEEK MEDICAL CARE IF:  Your condition is not improving after your transfusion. You develop redness or irritation at the intravenous (IV) site. SEEK IMMEDIATE MEDICAL CARE IF:  Any of the following symptoms occur over the next 12 hours: Shaking chills. You have a temperature by mouth above 102 F (38.9 C), not controlled by medicine. Chest, back, or muscle pain. People around you feel you are not acting correctly or are confused. Shortness of breath or difficulty breathing. Dizziness and fainting. You get a  rash or develop hives. You have a decrease in urine output. Your urine turns a dark color or changes to pink, red, or brown. Any of the following symptoms occur over the next 10 days: You have a temperature by mouth above 102 F (38.9 C), not controlled by medicine. Shortness of breath. Weakness after normal activity. The white part of the eye turns yellow (jaundice). You have a decrease in the amount of urine or are urinating less often. Your urine turns a dark color or changes to pink, red, or brown. Document Released: 05/18/2000 Document Revised: 08/13/2011 Document Reviewed: 01/05/2008 Exit[employer] Patient Information 2014 [place], [place].  _______________________________________________________________________    Incentive Spirometer  An incentive spirometer is a tool that can help keep your lungs clear and active. This tool measures how well you are filling your lungs with each breath. Taking long deep breaths may help reverse or decrease the chance of developing breathing (pulmonary) problems (especially infection) following: A long period of time when you are unable to move or be active. BEFORE THE PROCEDURE  If the spirometer includes an indicator to show your best effort, your nurse or respiratory therapist will set it to a desired goal. If possible, sit up straight or lean slightly forward. Try not to slouch. Hold the incentive spirometer in an upright position. INSTRUCTIONS FOR USE  Sit on the edge of your bed if possible, or sit up as far as you can in bed or on a chair. Hold the incentive spirometer in an upright position. Breathe out normally. Place the mouthpiece in your mouth and seal your lips tightly around it. Breathe in slowly and as deeply as possible, raising the piston or the ball toward the top of the column. Hold your breath for 3-5 seconds or for as long as possible. Allow the piston or ball to fall to the bottom of the column. Remove the mouthpiece from your  mouth and breathe out normally. Rest for a few seconds and repeat Steps 1 through 7 at least 10 times every 1-2 hours when you are awake. Take your time and take a few normal breaths between deep breaths. The spirometer may include an indicator to show your best effort. Use the indicator as a goal to work toward during each repetition. After each set of 10 deep breaths, practice coughing to be sure your lungs are clear. If you have an incision (the cut made at the time of surgery), support your incision when coughing by placing  a pillow or rolled up towels firmly against it. Once you are able to get out of bed, walk around indoors and cough well. You may stop using the incentive spirometer when instructed by your caregiver.  RISKS AND COMPLICATIONS Take your time so you do not get dizzy or light-headed. If you are in pain, you may need to take or ask for pain medication before doing incentive spirometry. It is harder to take a deep breath if you are having pain. AFTER USE Rest and breathe slowly and easily. It can be helpful to keep track of a log of your progress. Your caregiver can provide you with a simple table to help with this. If you are using the spirometer at home, follow these instructions: SEEK MEDICAL CARE IF:  You are having difficultly using the spirometer. You have trouble using the spirometer as often as instructed. Your pain medication is not giving enough relief while using the spirometer. You develop fever of 100.5 F (38.1 C) or higher. SEEK IMMEDIATE MEDICAL CARE IF:  You cough up bloody sputum that had not been present before. You develop fever of 102 F (38.9 C) or greater. You develop worsening pain at or near the incision site. MAKE SURE YOU:  Understand these instructions. Will watch your condition. Will get help right away if you are not doing well or get worse. Document Released: 10/01/2006 Document Revised: 08/13/2011 Document Reviewed: 12/02/2006 [facility]  Patient Information 2014 [place], [place].   ________________________________________________________________________

## 2023-05-14 NOTE — Progress Notes (Signed)
Sent message, via epic in basket, requesting orders in epic from surgeon.  

## 2023-05-16 ENCOUNTER — Ambulatory Visit: Payer: Self-pay | Admitting: Emergency Medicine

## 2023-05-16 DIAGNOSIS — G8929 Other chronic pain: Secondary | ICD-10-CM

## 2023-05-16 DIAGNOSIS — T84010S Broken internal right hip prosthesis, sequela: Secondary | ICD-10-CM

## 2023-05-16 LAB — HM DIABETES EYE EXAM

## 2023-05-16 NOTE — H&P (Signed)
TOTAL HIP REVISION ADMISSION H&P  Patient is admitted for right revision total hip arthroplasty.  Subjective:  Chief Complaint: right hip pain  HPI: Ebony [name], 57 y.o. female, has a history of pain and functional disability in the right hip due to  aseptic loosening  and patient has failed non-surgical conservative treatments for greater than 12 weeks to include NSAID's and/or analgesics, supervised PT with diminished ADL's post treatment, use of assistive devices, and activity modification. The indications for the revision total hip arthroplasty are loosening of one or more components.  Onset of symptoms was gradual starting 2 years ago with gradually worsening course since that time.  Prior procedures on the right hip include arthroplasty.  Patient currently rates pain in the right hip at 10 out of 10 with activity.  There is night pain, worsening of pain with activity and weight bearing, pain that interfers with activities of daily living, and pain with passive range of motion. Patient has evidence of prosthetic loosening by imaging studies.  This condition presents safety issues increasing the risk of falls.   There is no current active infection.  Patient Active Problem List   Diagnosis Date Noted   Essential hypertension 08/13/2019   Monoallelic mutation of BRIP1 gene 03/30/2018   Ovarian cancer genetic susceptibility 10/28/2017   BCC (basal cell carcinoma), face 08/30/2017   Obesity 09/05/2015   GERD (gastroesophageal reflux disease) 09/05/2015   Type 2 diabetes mellitus without complication, without long-term current use of insulin (HCC) 06/20/2015   Hyperlipidemia, mixed 12/28/2014   SVT (supraventricular tachycardia) (HCC) 05/06/2013   Palpitations 01/11/2013   Right shoulder pain 01/08/2013   Neck pain 01/08/2013   Depression with anxiety 10/06/2012   Hypothyroid 06/15/2012   Preventative health care 06/13/2012   Headache    Past Medical History:  Diagnosis Date    Arthritis    hips     BCC (basal cell carcinoma), face    skin    Concussion with no loss of consciousness 12/28/2014   Depression with anxiety 10/06/2012   Diabetes mellitus without complication (HCC)    no meds--- reports as borderline , last a1c 01-05-2019 with PCP 6.3%   GERD (gastroesophageal reflux disease) 09/05/2015   MGD WITH PPI    Headache(784.0)    otc meds prn, migraine, NOW 1-2 TIMES ANNUALLY    Hyperlipidemia, mixed 12/28/2014   NOT ON ANY MEDICATIONS UPDATED 12/04/21   Hypertension    only with preg. 11yrs ago 2002- no meds   Hypothyroid 06/15/2012   Obesity 09/05/2015   Post-operative nausea and vomiting    SVT (supraventricular tachycardia)    Tinea pedis 12/28/2014    Past Surgical History:  Procedure Laterality Date   ABDOMINAL HYSTERECTOMY  03/04/2012   Procedure: HYSTERECTOMY ABDOMINAL;  Surgeon: Zelphia Cairo, MD;  Location: [facility];  Service: Gynecology;  Laterality: N/A;  with lysis of adhesions   BASAL CELL CARCINOMA EXCISION Left    cheek/face   CESAREAN SECTION  2002   HELLP Syndrome/ one time   COLONOSCOPY     SAME TIME AS ENDO    DILATION AND CURETTAGE OF UTERUS  2007   Miscarriage   FINGER SURGERY     2023 x 2 (ORIF and removal)   HAND SURGERY Right    RING FINGER BROKEN 09/2021   LAPAROSCOPIC LYSIS OF ADHESIONS N/A 02/17/2019   Procedure: LAPAROSCOPIC LYSIS OF ADHESIONS;  Surgeon: Adolphus Birchwood, MD;  Location: [facility];  Service: Gynecology;  Laterality: N/A;   LAPAROSCOPIC  TUBAL LIGATION  01/03/2011   Procedure: LAPAROSCOPIC TUBAL LIGATION;  Surgeon: Zelphia Cairo;  Location: [facility];  Service: Gynecology;  Laterality: Bilateral;  with filshie clips   LAPAROSCOPY  03/04/2012   Procedure: LAPAROSCOPY DIAGNOSTIC;  Surgeon: Zelphia Cairo, MD;  Location: [facility];  Service: Gynecology;  Laterality: N/A;  with lysis of adhesions   POLYPECTOMY     ROBOTIC ASSISTED BILATERAL SALPINGO OOPHERECTOMY Bilateral 02/17/2019   Procedure: XI ROBOTIC ASSISTED  BILATERAL SALPINGO OOPHORECTOMY;  Surgeon: Adolphus Birchwood, MD;  Location: [facility];  Service: Gynecology;  Laterality: Bilateral;   SHOULDER ARTHROSCOPY  2012   right   SVT ABLATION N/A 12/17/2018   Procedure: SVT ABLATION;  Surgeon: Marinus Maw, MD;  Location: [facility];  Service: Cardiovascular;  Laterality: N/A;   TONSILLECTOMY     TOTAL HIP ARTHROPLASTY Right    FEB 2021   UPPER GI ENDOSCOPY      Current Outpatient Medications  Medication Sig Dispense Refill Last Dose/Taking   Ascorbic Acid (VITAMIN C) 1000 MG tablet Take 1,000 mg by mouth every evening.      ibuprofen (ADVIL) 200 MG tablet Take 600 mg by mouth every 6 (six) hours as needed for moderate pain (pain score 4-6).      Levomefolate Glucosamine (METHYLFOLATE PO) Take 15 mg by mouth daily.      levothyroxine (SYNTHROID) 75 MCG tablet TAKE 1 TABLET BY MOUTH EVERY DAY BEFORE BREAKFAST (Patient taking differently: Take 75 mcg by mouth daily before breakfast.) 90 tablet 3    pantoprazole (PROTONIX) 40 MG tablet TAKE 1 TABLET BY MOUTH TWICE A DAY (Patient taking differently: Take 40 mg by mouth every evening.) 180 tablet 0    SUMAtriptan (IMITREX) 50 MG tablet Take 1 tablet (50 mg total) by mouth every 2 (two) hours as needed for migraine or headache (max of 2 tabs in 24 hours). May repeat in 2 hours if headache persists or recurs. 10 tablet 0    tirzepatide (MOUNJARO) 7.5 MG/0.5ML Pen Inject 7.5 mg into the skin once a week. 6 mL     venlafaxine XR (EFFEXOR-XR) 75 MG 24 hr capsule TAKE 1 CAPSULE BY MOUTH DAILY WITH BREAKFAST. 30 capsule 5    No current facility-administered medications for this visit.   Allergies  Allergen Reactions   Sulfamethoxazole-Trimethoprim Nausea And Vomiting and Other (See Comments)    headache    Social History   Tobacco Use   Smoking status: Never    Passive exposure: Past (BOTH PARENTS SMOKED)   Smokeless tobacco: Never  Substance Use Topics   Alcohol use: Yes    Comment: occasionally      Family History  Problem Relation Age of Onset   Hypertension Mother    Depression Mother    Breast cancer Mother 71   Lung cancer Mother        lung (06-13-11)   Basal cell carcinoma Brother 47   Other Maternal Grandmother        spinal stenosis   Emphysema Maternal Grandfather    Prostate cancer Maternal Grandfather    Breast cancer Cousin 40   Colon cancer Neg Hx    Esophageal cancer Neg Hx    Pancreatic cancer Neg Hx    Stomach cancer Neg Hx    Colon polyps Neg Hx    Crohn's disease Neg Hx    Rectal cancer Neg Hx       Review of Systems  Musculoskeletal:  Positive for arthralgias.  All other  systems reviewed and are negative.   Objective:  Physical Exam Constitutional:      General: She is not in acute distress.    Appearance: Normal appearance. She is not ill-appearing.  HENT:     Head: Normocephalic and atraumatic.     Right Ear: External ear normal.     Left Ear: External ear normal.     Nose: Nose normal.     Mouth/Throat:     Mouth: Mucous membranes are moist.     Pharynx: Oropharynx is clear.  Eyes:     Extraocular Movements: Extraocular movements intact.     Conjunctiva/sclera: Conjunctivae normal.  Cardiovascular:     Rate and Rhythm: Normal rate and regular rhythm.     Pulses: Normal pulses.     Heart sounds: Normal heart sounds.  Pulmonary:     Effort: Pulmonary effort is normal.     Breath sounds: Normal breath sounds.  Abdominal:     General: Bowel sounds are normal.     Palpations: Abdomen is soft.     Tenderness: There is no abdominal tenderness.  Musculoskeletal:        General: Tenderness present.     Cervical back: Normal range of motion and neck supple.     Comments: TTP over groin, lateral aspect, greater trochanter.  No significant swelling.  No overlying lesions of area of chief complaint.  Decreased strength and ROM due to elicited pain.  Dorsiflexion and plantarflexion intact.  BLE appear grossly neurovascularly intact.  Gait  mildly antalgic.   Skin:    General: Skin is warm and dry.  Neurological:     Mental Status: She is alert and oriented to person, place, and time. Mental status is at baseline.  Psychiatric:        Mood and Affect: Mood normal.        Behavior: Behavior normal.     Vital signs in last 24 hours: @VSRANGES @   Labs:   Estimated body mass index is 31.92 kg/m as calculated from the following:   Height as of 01/03/23: 5\' 5"  (1.651 m).   Weight as of 01/03/23: 87 kg.  Imaging Review:  Plain radiographs demonstrate aseptic loosening of right hip(s). There is evidence of loosening of the acetabular cup and possibly the femoral stem.  The bone quality appears to be fair for age and reported activity level.      Assessment/Plan:  Aseptic loosening, right hip(s) with failed previous arthroplasty.  The patient history, physical examination, clinical judgement of the provider and imaging studies are consistent with aseptic loosening of the right hip(s), previous total hip arthroplasty. Revision total hip arthroplasty is deemed medically necessary. The treatment options including medical management, injection therapy, arthroscopy and arthroplasty were discussed at length. The risks and benefits of total hip arthroplasty were presented and reviewed. The risks due to aseptic loosening, infection, stiffness, dislocation/subluxation,  thromboembolic complications and other imponderables were discussed.  The patient acknowledged the explanation, agreed to proceed with the plan and consent was signed. Patient is being admitted for inpatient treatment for surgery, pain control, PT, OT, prophylactic antibiotics, VTE prophylaxis, progressive ambulation and ADL's and discharge planning. The patient is planning to be discharged home with outpatient PT.

## 2023-05-16 NOTE — H&P (View-Only) (Signed)
 TOTAL HIP REVISION ADMISSION H&P  Patient is admitted for right revision total hip arthroplasty.  Subjective:  Chief Complaint: right hip pain  HPI: Ebony [name], 57 y.o. female, has a history of pain and functional disability in the right hip due to  aseptic loosening  and patient has failed non-surgical conservative treatments for greater than 12 weeks to include NSAID's and/or analgesics, supervised PT with diminished ADL's post treatment, use of assistive devices, and activity modification. The indications for the revision total hip arthroplasty are loosening of one or more components.  Onset of symptoms was gradual starting 2 years ago with gradually worsening course since that time.  Prior procedures on the right hip include arthroplasty.  Patient currently rates pain in the right hip at 10 out of 10 with activity.  There is night pain, worsening of pain with activity and weight bearing, pain that interfers with activities of daily living, and pain with passive range of motion. Patient has evidence of prosthetic loosening by imaging studies.  This condition presents safety issues increasing the risk of falls.   There is no current active infection.  Patient Active Problem List   Diagnosis Date Noted   Essential hypertension 08/13/2019   Monoallelic mutation of BRIP1 gene 03/30/2018   Ovarian cancer genetic susceptibility 10/28/2017   BCC (basal cell carcinoma), face 08/30/2017   Obesity 09/05/2015   GERD (gastroesophageal reflux disease) 09/05/2015   Type 2 diabetes mellitus without complication, without long-term current use of insulin (HCC) 06/20/2015   Hyperlipidemia, mixed 12/28/2014   SVT (supraventricular tachycardia) (HCC) 05/06/2013   Palpitations 01/11/2013   Right shoulder pain 01/08/2013   Neck pain 01/08/2013   Depression with anxiety 10/06/2012   Hypothyroid 06/15/2012   Preventative health care 06/13/2012   Headache    Past Medical History:  Diagnosis Date    Arthritis    hips     BCC (basal cell carcinoma), face    skin    Concussion with no loss of consciousness 12/28/2014   Depression with anxiety 10/06/2012   Diabetes mellitus without complication (HCC)    no meds--- reports as borderline , last a1c 01-05-2019 with PCP 6.3%   GERD (gastroesophageal reflux disease) 09/05/2015   MGD WITH PPI    Headache(784.0)    otc meds prn, migraine, NOW 1-2 TIMES ANNUALLY    Hyperlipidemia, mixed 12/28/2014   NOT ON ANY MEDICATIONS UPDATED 12/04/21   Hypertension    only with preg. 11yrs ago 2002- no meds   Hypothyroid 06/15/2012   Obesity 09/05/2015   Post-operative nausea and vomiting    SVT (supraventricular tachycardia)    Tinea pedis 12/28/2014    Past Surgical History:  Procedure Laterality Date   ABDOMINAL HYSTERECTOMY  03/04/2012   Procedure: HYSTERECTOMY ABDOMINAL;  Surgeon: Zelphia Cairo, MD;  Location: [facility];  Service: Gynecology;  Laterality: N/A;  with lysis of adhesions   BASAL CELL CARCINOMA EXCISION Left    cheek/face   CESAREAN SECTION  2002   HELLP Syndrome/ one time   COLONOSCOPY     SAME TIME AS ENDO    DILATION AND CURETTAGE OF UTERUS  2007   Miscarriage   FINGER SURGERY     2023 x 2 (ORIF and removal)   HAND SURGERY Right    RING FINGER BROKEN 09/2021   LAPAROSCOPIC LYSIS OF ADHESIONS N/A 02/17/2019   Procedure: LAPAROSCOPIC LYSIS OF ADHESIONS;  Surgeon: Adolphus Birchwood, MD;  Location: [facility];  Service: Gynecology;  Laterality: N/A;   LAPAROSCOPIC  TUBAL LIGATION  01/03/2011   Procedure: LAPAROSCOPIC TUBAL LIGATION;  Surgeon: Zelphia Cairo;  Location: [facility];  Service: Gynecology;  Laterality: Bilateral;  with filshie clips   LAPAROSCOPY  03/04/2012   Procedure: LAPAROSCOPY DIAGNOSTIC;  Surgeon: Zelphia Cairo, MD;  Location: [facility];  Service: Gynecology;  Laterality: N/A;  with lysis of adhesions   POLYPECTOMY     ROBOTIC ASSISTED BILATERAL SALPINGO OOPHERECTOMY Bilateral 02/17/2019   Procedure: XI ROBOTIC ASSISTED  BILATERAL SALPINGO OOPHORECTOMY;  Surgeon: Adolphus Birchwood, MD;  Location: [facility];  Service: Gynecology;  Laterality: Bilateral;   SHOULDER ARTHROSCOPY  2012   right   SVT ABLATION N/A 12/17/2018   Procedure: SVT ABLATION;  Surgeon: Marinus Maw, MD;  Location: [facility];  Service: Cardiovascular;  Laterality: N/A;   TONSILLECTOMY     TOTAL HIP ARTHROPLASTY Right    FEB 2021   UPPER GI ENDOSCOPY      Current Outpatient Medications  Medication Sig Dispense Refill Last Dose/Taking   Ascorbic Acid (VITAMIN C) 1000 MG tablet Take 1,000 mg by mouth every evening.      ibuprofen (ADVIL) 200 MG tablet Take 600 mg by mouth every 6 (six) hours as needed for moderate pain (pain score 4-6).      Levomefolate Glucosamine (METHYLFOLATE PO) Take 15 mg by mouth daily.      levothyroxine (SYNTHROID) 75 MCG tablet TAKE 1 TABLET BY MOUTH EVERY DAY BEFORE BREAKFAST (Patient taking differently: Take 75 mcg by mouth daily before breakfast.) 90 tablet 3    pantoprazole (PROTONIX) 40 MG tablet TAKE 1 TABLET BY MOUTH TWICE A DAY (Patient taking differently: Take 40 mg by mouth every evening.) 180 tablet 0    SUMAtriptan (IMITREX) 50 MG tablet Take 1 tablet (50 mg total) by mouth every 2 (two) hours as needed for migraine or headache (max of 2 tabs in 24 hours). May repeat in 2 hours if headache persists or recurs. 10 tablet 0    tirzepatide (MOUNJARO) 7.5 MG/0.5ML Pen Inject 7.5 mg into the skin once a week. 6 mL     venlafaxine XR (EFFEXOR-XR) 75 MG 24 hr capsule TAKE 1 CAPSULE BY MOUTH DAILY WITH BREAKFAST. 30 capsule 5    No current facility-administered medications for this visit.   Allergies  Allergen Reactions   Sulfamethoxazole-Trimethoprim Nausea And Vomiting and Other (See Comments)    headache    Social History   Tobacco Use   Smoking status: Never    Passive exposure: Past (BOTH PARENTS SMOKED)   Smokeless tobacco: Never  Substance Use Topics   Alcohol use: Yes    Comment: occasionally      Family History  Problem Relation Age of Onset   Hypertension Mother    Depression Mother    Breast cancer Mother 71   Lung cancer Mother        lung (06-13-11)   Basal cell carcinoma Brother 47   Other Maternal Grandmother        spinal stenosis   Emphysema Maternal Grandfather    Prostate cancer Maternal Grandfather    Breast cancer Cousin 40   Colon cancer Neg Hx    Esophageal cancer Neg Hx    Pancreatic cancer Neg Hx    Stomach cancer Neg Hx    Colon polyps Neg Hx    Crohn's disease Neg Hx    Rectal cancer Neg Hx       Review of Systems  Musculoskeletal:  Positive for arthralgias.  All other  systems reviewed and are negative.   Objective:  Physical Exam Constitutional:      General: She is not in acute distress.    Appearance: Normal appearance. She is not ill-appearing.  HENT:     Head: Normocephalic and atraumatic.     Right Ear: External ear normal.     Left Ear: External ear normal.     Nose: Nose normal.     Mouth/Throat:     Mouth: Mucous membranes are moist.     Pharynx: Oropharynx is clear.  Eyes:     Extraocular Movements: Extraocular movements intact.     Conjunctiva/sclera: Conjunctivae normal.  Cardiovascular:     Rate and Rhythm: Normal rate and regular rhythm.     Pulses: Normal pulses.     Heart sounds: Normal heart sounds.  Pulmonary:     Effort: Pulmonary effort is normal.     Breath sounds: Normal breath sounds.  Abdominal:     General: Bowel sounds are normal.     Palpations: Abdomen is soft.     Tenderness: There is no abdominal tenderness.  Musculoskeletal:        General: Tenderness present.     Cervical back: Normal range of motion and neck supple.     Comments: TTP over groin, lateral aspect, greater trochanter.  No significant swelling.  No overlying lesions of area of chief complaint.  Decreased strength and ROM due to elicited pain.  Dorsiflexion and plantarflexion intact.  BLE appear grossly neurovascularly intact.  Gait  mildly antalgic.   Skin:    General: Skin is warm and dry.  Neurological:     Mental Status: She is alert and oriented to person, place, and time. Mental status is at baseline.  Psychiatric:        Mood and Affect: Mood normal.        Behavior: Behavior normal.     Vital signs in last 24 hours: @VSRANGES @   Labs:   Estimated body mass index is 31.92 kg/m as calculated from the following:   Height as of 01/03/23: 5\' 5"  (1.651 m).   Weight as of 01/03/23: 87 kg.  Imaging Review:  Plain radiographs demonstrate aseptic loosening of right hip(s). There is evidence of loosening of the acetabular cup and possibly the femoral stem.  The bone quality appears to be fair for age and reported activity level.      Assessment/Plan:  Aseptic loosening, right hip(s) with failed previous arthroplasty.  The patient history, physical examination, clinical judgement of the provider and imaging studies are consistent with aseptic loosening of the right hip(s), previous total hip arthroplasty. Revision total hip arthroplasty is deemed medically necessary. The treatment options including medical management, injection therapy, arthroscopy and arthroplasty were discussed at length. The risks and benefits of total hip arthroplasty were presented and reviewed. The risks due to aseptic loosening, infection, stiffness, dislocation/subluxation,  thromboembolic complications and other imponderables were discussed.  The patient acknowledged the explanation, agreed to proceed with the plan and consent was signed. Patient is being admitted for inpatient treatment for surgery, pain control, PT, OT, prophylactic antibiotics, VTE prophylaxis, progressive ambulation and ADL's and discharge planning. The patient is planning to be discharged home with outpatient PT.

## 2023-05-21 ENCOUNTER — Encounter (HOSPITAL_COMMUNITY)
Admission: RE | Admit: 2023-05-21 | Discharge: 2023-05-21 | Disposition: A | Discharge status: Home / Self Care | Payer: Managed Care, Other (non HMO) | Source: Ambulatory Visit | Attending: Orthopedic Surgery | Admitting: Orthopedic Surgery

## 2023-05-21 ENCOUNTER — Other Ambulatory Visit: Payer: Self-pay

## 2023-05-21 ENCOUNTER — Encounter (HOSPITAL_COMMUNITY): Payer: Self-pay

## 2023-05-21 VITALS — BP 124/89 | HR 78 | Temp 98.0°F | Resp 16 | Ht 65.0 in | Wt 188.0 lb

## 2023-05-21 DIAGNOSIS — K219 Gastro-esophageal reflux disease without esophagitis: Secondary | ICD-10-CM | POA: Insufficient documentation

## 2023-05-21 DIAGNOSIS — Z96641 Presence of right artificial hip joint: Secondary | ICD-10-CM | POA: Insufficient documentation

## 2023-05-21 DIAGNOSIS — F419 Anxiety disorder, unspecified: Secondary | ICD-10-CM | POA: Diagnosis not present

## 2023-05-21 DIAGNOSIS — E039 Hypothyroidism, unspecified: Secondary | ICD-10-CM | POA: Insufficient documentation

## 2023-05-21 DIAGNOSIS — X58XXXS Exposure to other specified factors, sequela: Secondary | ICD-10-CM | POA: Diagnosis not present

## 2023-05-21 DIAGNOSIS — Z01818 Encounter for other preprocedural examination: Secondary | ICD-10-CM | POA: Diagnosis present

## 2023-05-21 DIAGNOSIS — T84010S Broken internal right hip prosthesis, sequela: Secondary | ICD-10-CM | POA: Diagnosis not present

## 2023-05-21 DIAGNOSIS — E119 Type 2 diabetes mellitus without complications: Secondary | ICD-10-CM | POA: Insufficient documentation

## 2023-05-21 DIAGNOSIS — M25551 Pain in right hip: Secondary | ICD-10-CM | POA: Diagnosis not present

## 2023-05-21 DIAGNOSIS — F32A Depression, unspecified: Secondary | ICD-10-CM | POA: Insufficient documentation

## 2023-05-21 DIAGNOSIS — I10 Essential (primary) hypertension: Secondary | ICD-10-CM

## 2023-05-21 DIAGNOSIS — G8929 Other chronic pain: Secondary | ICD-10-CM | POA: Diagnosis not present

## 2023-05-21 LAB — TYPE AND SCREEN
ABO/RH(D): B NEG
Antibody Screen: NEGATIVE

## 2023-05-21 LAB — CBC WITH DIFFERENTIAL/PLATELET
Abs Immature Granulocytes: 0.02 K/uL (ref 0.00–0.07)
Basophils Absolute: 0.1 K/uL (ref 0.0–0.1)
Basophils Relative: 1 %
Eosinophils Absolute: 0.2 K/uL (ref 0.0–0.5)
Eosinophils Relative: 2 %
HCT: 40.9 % (ref 36.0–46.0)
Hemoglobin: 13.5 g/dL (ref 12.0–15.0)
Immature Granulocytes: 0 %
Lymphocytes Relative: 34 %
Lymphs Abs: 2.8 K/uL (ref 0.7–4.0)
MCH: 28 pg (ref 26.0–34.0)
MCHC: 33 g/dL (ref 30.0–36.0)
MCV: 84.7 fL (ref 80.0–100.0)
Monocytes Absolute: 0.5 K/uL (ref 0.1–1.0)
Monocytes Relative: 6 %
Neutro Abs: 4.7 K/uL (ref 1.7–7.7)
Neutrophils Relative %: 57 %
Platelets: 308 K/uL (ref 150–400)
RBC: 4.83 MIL/uL (ref 3.87–5.11)
RDW: 13 % (ref 11.5–15.5)
WBC: 8.3 K/uL (ref 4.0–10.5)
nRBC: 0 % (ref 0.0–0.2)

## 2023-05-21 LAB — SURGICAL PCR SCREEN
MRSA, PCR: NEGATIVE
Staphylococcus aureus: NEGATIVE

## 2023-05-21 LAB — COMPREHENSIVE METABOLIC PANEL WITH GFR
ALT: 15 U/L (ref 0–44)
AST: 19 U/L (ref 15–41)
Albumin: 4.3 g/dL (ref 3.5–5.0)
Alkaline Phosphatase: 73 U/L (ref 38–126)
Anion gap: 9 (ref 5–15)
BUN: 16 mg/dL (ref 6–20)
CO2: 25 mmol/L (ref 22–32)
Calcium: 9.4 mg/dL (ref 8.9–10.3)
Chloride: 103 mmol/L (ref 98–111)
Creatinine, Ser: 0.72 mg/dL (ref 0.44–1.00)
GFR, Estimated: >60 mL/min
Glucose, Bld: 91 mg/dL (ref 70–99)
Potassium: 4.1 mmol/L (ref 3.5–5.1)
Sodium: 137 mmol/L (ref 135–145)
Total Bilirubin: 0.8 mg/dL
Total Protein: 8.1 g/dL (ref 6.5–8.1)

## 2023-05-21 LAB — HEMOGLOBIN A1C
Hgb A1c MFr Bld: 5.4 % (ref 4.8–5.6)
Mean Plasma Glucose: 108.28 mg/dL

## 2023-05-21 LAB — GLUCOSE, CAPILLARY: Glucose-Capillary: 93 mg/dL (ref 70–99)

## 2023-05-22 NOTE — Progress Notes (Signed)
Case: [id number] Date/Time: 06/03/23 1130   Procedure: TOTAL HIP REVISION (Right: Hip)   Anesthesia type: Spinal   Pre-op diagnosis: FAILED RIGHT HIP PROSTHESIS   Location: [facility] ROOM 08 / [facility]   Surgeons: Joen Laura, MD       DISCUSSION: Ebony [name] is a 57 yo female who presents to PAT prior to surgery above. PMH of HTN, SVT s/p ablation, GERD, migraines, hypothyroidism, diabetes, depression/anxiety.  Prior anesthesia complications include PONV.  She has hx of SVT and had an ablation in 2020. Last echo showed mild MVP. Last seen by Cardiology in March 2023 and advised to f/u in 1 year. She does not take a BB. She does follow with her PCP regularly. HTN is controlled without meds. She takes [facility] for her DM.  Pt had a severe headache that prompted an ED visit on 12/21/22. It improved with IV meds. LP was discussed however pt declined. MRI was done by PCP in August which was normal.  Last dose Mounjaro: 05/23/23  VS: BP 124/89   Pulse 78   Temp 36.7 C (Oral)   Resp 16   Ht 5\' 5"  (1.651 m)   Wt 85.3 kg   LMP 12/15/2010   SpO2 98%   BMI 31.28 kg/m   PROVIDERS: Bradd Canary, MD   LABS: Labs reviewed: Acceptable for surgery. (all labs ordered are listed, but only abnormal results are displayed)  Labs Reviewed  SURGICAL PCR SCREEN  HEMOGLOBIN A1C  CBC WITH DIFFERENTIAL/PLATELET  COMPREHENSIVE METABOLIC PANEL  GLUCOSE, CAPILLARY  TYPE AND SCREEN     IMAGES:  MRI Brain 01/09/23:  IMPRESSION: Unremarkable MRI appearance of the brain. No evidence of an acute intracranial abnormality.  EKG:   CV:  Ablation procedure 12/17/2018:  CONCLUSIONS:  1. Sinus rhythm upon presentation.  2. The patient had dual AV nodal physiology with easily inducible classic AV nodal reentrant tachycardia, there were no other accessory pathways or arrhythmias induced  3. Successful radiofrequency modification of the slow AV nodal pathway  4. No inducible arrhythmias  following ablation.  5. No early apparent complications.   Echo 05/14/2018:  Study Conclusions  - Left ventricle: The cavity size was normal. Wall thickness was   increased in a pattern of mild LVH. Systolic function was normal.   The estimated ejection fraction was in the range of 60% to 65%.   Wall motion was normal; there were no regional wall motion   abnormalities. Left ventricular diastolic function parameters   were normal. - Mitral valve: Trivial, late systolicprolapse, involving the   anterior leaflet. - Right ventricle: Systolic function was normal. - Atrial septum: No defect or patent foramen ovale was identified.  Impressions:  - Normal LV EF. Minor prolapse of anterior mitral valve leaflet   without regurgitation.  Past Medical History:  Diagnosis Date   Arthritis    hips     BCC (basal cell carcinoma), face    skin    Concussion with no loss of consciousness 12/28/2014   Depression with anxiety 10/06/2012   Diabetes mellitus without complication (HCC)    no meds--- reports as borderline , last a1c 01-05-2019 with PCP 6.3%   GERD (gastroesophageal reflux disease) 09/05/2015   MGD WITH PPI    Headache(784.0)    otc meds prn, migraine, NOW 1-2 TIMES ANNUALLY    Hyperlipidemia, mixed 12/28/2014   NOT ON ANY MEDICATIONS UPDATED 12/04/21   Hypertension    only with preg. 19yrs ago 2002- no meds  Hypothyroid 06/15/2012   Obesity 09/05/2015   Post-operative nausea and vomiting    SVT (supraventricular tachycardia) (HCC)    Tinea pedis 12/28/2014    Past Surgical History:  Procedure Laterality Date   ABDOMINAL HYSTERECTOMY  03/04/2012   Procedure: HYSTERECTOMY ABDOMINAL;  Surgeon: Zelphia Cairo, MD;  Location: [facility];  Service: Gynecology;  Laterality: N/A;  with lysis of adhesions   BASAL CELL CARCINOMA EXCISION Left    cheek/face   CESAREAN SECTION  2002   HELLP Syndrome/ one time   COLONOSCOPY     SAME TIME AS ENDO    DILATION AND CURETTAGE OF UTERUS   2007   Miscarriage   FINGER SURGERY     2023 x 2 (ORIF and removal)   HAND SURGERY Right    RING FINGER BROKEN 09/2021   LAPAROSCOPIC LYSIS OF ADHESIONS N/A 02/17/2019   Procedure: LAPAROSCOPIC LYSIS OF ADHESIONS;  Surgeon: Adolphus Birchwood, MD;  Location: [facility];  Service: Gynecology;  Laterality: N/A;   LAPAROSCOPIC TUBAL LIGATION  01/03/2011   Procedure: LAPAROSCOPIC TUBAL LIGATION;  Surgeon: Zelphia Cairo;  Location: [facility];  Service: Gynecology;  Laterality: Bilateral;  with filshie clips   LAPAROSCOPY  03/04/2012   Procedure: LAPAROSCOPY DIAGNOSTIC;  Surgeon: Zelphia Cairo, MD;  Location: [facility];  Service: Gynecology;  Laterality: N/A;  with lysis of adhesions   POLYPECTOMY     ROBOTIC ASSISTED BILATERAL SALPINGO OOPHERECTOMY Bilateral 02/17/2019   Procedure: XI ROBOTIC ASSISTED BILATERAL SALPINGO OOPHORECTOMY;  Surgeon: Adolphus Birchwood, MD;  Location: [facility];  Service: Gynecology;  Laterality: Bilateral;   SHOULDER ARTHROSCOPY  2012   right   SVT ABLATION N/A 12/17/2018   Procedure: SVT ABLATION;  Surgeon: Marinus Maw, MD;  Location: [facility];  Service: Cardiovascular;  Laterality: N/A;   TONSILLECTOMY     TOTAL HIP ARTHROPLASTY Right    FEB 2021   UPPER GI ENDOSCOPY      MEDICATIONS:  Ascorbic Acid (VITAMIN C) 1000 MG tablet   ibuprofen (ADVIL) 200 MG tablet   Levomefolate Glucosamine (METHYLFOLATE PO)   levothyroxine (SYNTHROID) 75 MCG tablet   pantoprazole (PROTONIX) 40 MG tablet   SUMAtriptan (IMITREX) 50 MG tablet   tirzepatide (MOUNJARO) 7.5 MG/0.5ML Pen   venlafaxine XR (EFFEXOR-XR) 75 MG 24 hr capsule   No current facility-administered medications for this encounter.   Marcille Blanco [facility] Short Stay/Anesthesiology [facility] Phone [phone] 05/22/2023 1:29 PM

## 2023-05-22 NOTE — Anesthesia Preprocedure Evaluation (Addendum)
Anesthesia Evaluation  Patient identified by MRN, date of birth, ID band Patient awake    Reviewed: Allergy & Precautions, H&P , NPO status , Patient's Chart, lab work & pertinent test results  History of Anesthesia Complications (+) PONV and history of anesthetic complications  Airway Mallampati: II  TM Distance: >3 FB Neck ROM: Full    Dental no notable dental hx. (+) Teeth Intact, Dental Advisory Given   Pulmonary neg pulmonary ROS   Pulmonary exam normal breath sounds clear to auscultation       Cardiovascular Normal cardiovascular exam     Neuro/Psych  Headaches  Anxiety Depression       GI/Hepatic Neg liver ROS,GERD  Medicated and Controlled,,  Endo/Other  diabetes, Well ControlledHypothyroidism  Class 3 obesity  Renal/GU negative Renal ROS  negative genitourinary   Musculoskeletal  (+) Arthritis , Osteoarthritis,    Abdominal Normal abdominal exam  (+)   Peds  Hematology negative hematology ROS (+)   Anesthesia Other Findings   Reproductive/Obstetrics negative OB ROS                             Anesthesia Physical Anesthesia Plan  ASA: III  Anesthesia Plan: Spinal   Post-op Pain Management:    Induction:   PONV Risk Score and Plan: 4 or greater and Ondansetron, Dexamethasone and Midazolam  Airway Management Planned: Natural Airway and Simple Face Mask  Additional Equipment: None  Intra-op Plan:   Post-operative Plan:   Informed Consent: I have reviewed the patients History and Physical, chart, labs and discussed the procedure including the risks, benefits and alternatives for the proposed anesthesia with the patient or authorized representative who has indicated his/her understanding and acceptance.       Plan Discussed with: CRNA  Anesthesia Plan Comments: (See PAT note from 12/17 by Sherlie Ban PA-C )        Anesthesia Quick Evaluation

## 2023-05-31 NOTE — Progress Notes (Signed)
Pt aware to arrive at [facility] admitting at 1015 on Monday 06/03/2023 for scheduled surgical procedure. No food after midnight; clear liquids from midnight till 0930 consuming entire pre surgery drink by 0930 then nothing by mouth.

## 2023-06-03 ENCOUNTER — Ambulatory Visit (HOSPITAL_COMMUNITY): Payer: 59 | Admitting: Medical

## 2023-06-03 ENCOUNTER — Ambulatory Visit (HOSPITAL_COMMUNITY): Payer: 59

## 2023-06-03 ENCOUNTER — Inpatient Hospital Stay (HOSPITAL_COMMUNITY): Payer: 59

## 2023-06-03 ENCOUNTER — Other Ambulatory Visit: Payer: Self-pay

## 2023-06-03 ENCOUNTER — Observation Stay (HOSPITAL_COMMUNITY)
Admission: AD | Admit: 2023-06-03 | Discharge: 2023-06-04 | Disposition: A | Discharge status: Home / Self Care | Payer: 59 | Attending: Orthopedic Surgery | Admitting: Orthopedic Surgery

## 2023-06-03 ENCOUNTER — Encounter (HOSPITAL_COMMUNITY): Payer: Self-pay | Admitting: Orthopedic Surgery

## 2023-06-03 ENCOUNTER — Encounter (HOSPITAL_COMMUNITY)
Admission: AD | Disposition: A | Discharge status: Home / Self Care | Payer: Self-pay | Source: Home / Self Care | Attending: Orthopedic Surgery

## 2023-06-03 ENCOUNTER — Ambulatory Visit (HOSPITAL_COMMUNITY): Payer: 59 | Admitting: Anesthesiology

## 2023-06-03 DIAGNOSIS — Y828 Other medical devices associated with adverse incidents: Secondary | ICD-10-CM | POA: Insufficient documentation

## 2023-06-03 DIAGNOSIS — Z96641 Presence of right artificial hip joint: Secondary | ICD-10-CM | POA: Insufficient documentation

## 2023-06-03 DIAGNOSIS — T84010A Broken internal right hip prosthesis, initial encounter: Principal | ICD-10-CM

## 2023-06-03 DIAGNOSIS — E039 Hypothyroidism, unspecified: Secondary | ICD-10-CM | POA: Diagnosis not present

## 2023-06-03 DIAGNOSIS — I10 Essential (primary) hypertension: Secondary | ICD-10-CM | POA: Insufficient documentation

## 2023-06-03 DIAGNOSIS — E119 Type 2 diabetes mellitus without complications: Secondary | ICD-10-CM | POA: Diagnosis not present

## 2023-06-03 DIAGNOSIS — Z01818 Encounter for other preprocedural examination: Secondary | ICD-10-CM

## 2023-06-03 DIAGNOSIS — T84090A Other mechanical complication of internal right hip prosthesis, initial encounter: Secondary | ICD-10-CM | POA: Diagnosis present

## 2023-06-03 DIAGNOSIS — Z85828 Personal history of other malignant neoplasm of skin: Secondary | ICD-10-CM | POA: Diagnosis not present

## 2023-06-03 DIAGNOSIS — Z96649 Presence of unspecified artificial hip joint: Secondary | ICD-10-CM

## 2023-06-03 HISTORY — PX: TOTAL HIP REVISION: SHX763

## 2023-06-03 LAB — CBC
HCT: 39.1 % (ref 36.0–46.0)
Hemoglobin: 12.5 g/dL (ref 12.0–15.0)
MCH: 27.4 pg (ref 26.0–34.0)
MCHC: 32 g/dL (ref 30.0–36.0)
MCV: 85.6 fL (ref 80.0–100.0)
Platelets: 307 10*3/uL (ref 150–400)
RBC: 4.57 MIL/uL (ref 3.87–5.11)
RDW: 13.4 % (ref 11.5–15.5)
WBC: 10.6 10*3/uL — ABNORMAL HIGH (ref 4.0–10.5)
nRBC: 0 % (ref 0.0–0.2)

## 2023-06-03 LAB — BASIC METABOLIC PANEL
Anion gap: 6 (ref 5–15)
BUN: 11 mg/dL (ref 6–20)
CO2: 25 mmol/L (ref 22–32)
Calcium: 9 mg/dL (ref 8.9–10.3)
Chloride: 109 mmol/L (ref 98–111)
Creatinine, Ser: 0.74 mg/dL (ref 0.44–1.00)
GFR, Estimated: >60 mL/min (ref >60)
Glucose, Bld: 130 mg/dL — ABNORMAL HIGH (ref 70–99)
Potassium: 4.2 mmol/L (ref 3.5–5.1)
Sodium: 140 mmol/L (ref 135–145)

## 2023-06-03 LAB — GLUCOSE, CAPILLARY
Glucose-Capillary: 97 mg/dL (ref 70–99)
Glucose-Capillary: 102 mg/dL — ABNORMAL HIGH (ref 70–99)

## 2023-06-03 SURGERY — TOTAL HIP REVISION
Anesthesia: Spinal | Site: Hip | Laterality: Right

## 2023-06-03 MED ORDER — DEXAMETHASONE SODIUM PHOSPHATE 10 MG/ML IJ SOLN
INTRAMUSCULAR | Status: AC
  Filled 2023-06-03: qty 1

## 2023-06-03 MED ORDER — ACETAMINOPHEN 500 MG PO TABS: 1000.0000 mg | ORAL_TABLET | Freq: Three times a day (TID) | ORAL | Status: AC | PRN

## 2023-06-03 MED ORDER — LACTATED RINGERS IV SOLN: INTRAVENOUS | Status: AC

## 2023-06-03 MED ORDER — CEFAZOLIN SODIUM-DEXTROSE 2-4 GM/100ML-% IV SOLN
2.0000 g | Freq: Three times a day (TID) | INTRAVENOUS | Status: DC
  Administered 2023-06-03 – 2023-06-04 (×3): 2 g via INTRAVENOUS
  Filled 2023-06-03 (×3): qty 100

## 2023-06-03 MED ORDER — SODIUM CHLORIDE 0.9 % IR SOLN
Status: DC | PRN
  Administered 2023-06-03: 1000 mL
  Administered 2023-06-03: 3000 mL

## 2023-06-03 MED ORDER — SODIUM CHLORIDE (PF) 0.9 % IJ SOLN
INTRAMUSCULAR | Status: AC
  Filled 2023-06-03: qty 10

## 2023-06-03 MED ORDER — WATER FOR IRRIGATION, STERILE IR SOLN
Status: DC | PRN
  Administered 2023-06-03: 1000 mL

## 2023-06-03 MED ORDER — ISOPROPYL ALCOHOL 70 % SOLN
Status: DC | PRN
  Administered 2023-06-03: 1 via TOPICAL

## 2023-06-03 MED ORDER — ACETAMINOPHEN 325 MG PO TABS
325.0000 mg | ORAL_TABLET | Freq: Four times a day (QID) | ORAL | Status: DC | PRN
Start: 2023-06-04 — End: 2023-06-04

## 2023-06-03 MED ORDER — ALBUMIN HUMAN 5 % IV SOLN: INTRAVENOUS | Status: DC | PRN

## 2023-06-03 MED ORDER — HYDROMORPHONE HCL 1 MG/ML IJ SOLN
INTRAMUSCULAR | Status: AC
  Administered 2023-06-03: 0.5 mg via INTRAVENOUS
  Filled 2023-06-03: qty 1

## 2023-06-03 MED ORDER — PANTOPRAZOLE SODIUM 40 MG PO TBEC
40.0000 mg | DELAYED_RELEASE_TABLET | Freq: Every day | ORAL | Status: DC
  Administered 2023-06-03 – 2023-06-04 (×2): 40 mg via ORAL
  Filled 2023-06-03 (×2): qty 1

## 2023-06-03 MED ORDER — PROPOFOL 1000 MG/100ML IV EMUL
INTRAVENOUS | Status: AC
  Filled 2023-06-03: qty 100

## 2023-06-03 MED ORDER — SUMATRIPTAN SUCCINATE 50 MG PO TABS: 50.0000 mg | ORAL_TABLET | ORAL | Status: DC | PRN

## 2023-06-03 MED ORDER — SENNA 8.6 MG PO TABS
1.0000 | ORAL_TABLET | Freq: Two times a day (BID) | ORAL | Status: DC
  Administered 2023-06-03 – 2023-06-04 (×2): 8.6 mg via ORAL
  Filled 2023-06-03 (×2): qty 1

## 2023-06-03 MED ORDER — HYDROMORPHONE HCL 1 MG/ML IJ SOLN
INTRAMUSCULAR | Status: AC
  Filled 2023-06-03: qty 1

## 2023-06-03 MED ORDER — BUPIVACAINE-EPINEPHRINE (PF) 0.25% -1:200000 IJ SOLN
INTRAMUSCULAR | Status: DC | PRN
  Administered 2023-06-03: 30 mL

## 2023-06-03 MED ORDER — OXYCODONE HCL 5 MG PO TABS: 5.0000 mg | ORAL_TABLET | ORAL | 0 refills | Status: AC | PRN

## 2023-06-03 MED ORDER — BUPIVACAINE LIPOSOME 1.3 % IJ SUSP
INTRAMUSCULAR | Status: AC
  Filled 2023-06-03: qty 20

## 2023-06-03 MED ORDER — ASPIRIN 81 MG PO TBEC: 81.0000 mg | DELAYED_RELEASE_TABLET | Freq: Two times a day (BID) | ORAL | Status: AC

## 2023-06-03 MED ORDER — BUPIVACAINE LIPOSOME 1.3 % IJ SUSP: 10.0000 mL | Freq: Once | INTRAMUSCULAR | Status: DC

## 2023-06-03 MED ORDER — LACTATED RINGERS IV SOLN: INTRAVENOUS | Status: DC

## 2023-06-03 MED ORDER — MENTHOL 3 MG MT LOZG: 1.0000 | LOZENGE | OROMUCOSAL | Status: DC | PRN

## 2023-06-03 MED ORDER — POVIDONE-IODINE 10 % EX SWAB: 2.0000 | Freq: Once | CUTANEOUS | Status: DC

## 2023-06-03 MED ORDER — KETOROLAC TROMETHAMINE 30 MG/ML IJ SOLN
INTRAMUSCULAR | Status: AC
  Administered 2023-06-03: 30 mg via INTRAVENOUS
  Filled 2023-06-03: qty 1

## 2023-06-03 MED ORDER — PHENYLEPHRINE HCL-NACL 20-0.9 MG/250ML-% IV SOLN
INTRAVENOUS | Status: DC | PRN
  Administered 2023-06-03: 20 ug/min via INTRAVENOUS

## 2023-06-03 MED ORDER — MEPERIDINE HCL 50 MG/ML IJ SOLN: 6.2500 mg | INTRAMUSCULAR | Status: DC | PRN

## 2023-06-03 MED ORDER — SODIUM CHLORIDE (PF) 0.9 % IJ SOLN
INTRAMUSCULAR | Status: DC | PRN
  Administered 2023-06-03: 30 mL

## 2023-06-03 MED ORDER — SODIUM CHLORIDE 0.9 % IV SOLN: INTRAVENOUS | Status: DC

## 2023-06-03 MED ORDER — CEFAZOLIN SODIUM-DEXTROSE 2-4 GM/100ML-% IV SOLN
2.0000 g | INTRAVENOUS | Status: AC
  Administered 2023-06-03: 2 g via INTRAVENOUS
  Filled 2023-06-03: qty 100

## 2023-06-03 MED ORDER — FENTANYL CITRATE (PF) 100 MCG/2ML IJ SOLN
INTRAMUSCULAR | Status: DC | PRN
  Administered 2023-06-03: 50 ug via INTRAVENOUS
  Administered 2023-06-03 (×2): 25 ug via INTRAVENOUS

## 2023-06-03 MED ORDER — POLYETHYLENE GLYCOL 3350 17 G PO PACK: 17.0000 g | PACK | Freq: Every day | ORAL | 0 refills | Status: DC

## 2023-06-03 MED ORDER — BUPIVACAINE IN DEXTROSE 0.75-8.25 % IT SOLN
INTRATHECAL | Status: DC | PRN
  Administered 2023-06-03: 2 mL via INTRATHECAL

## 2023-06-03 MED ORDER — METHOCARBAMOL 500 MG PO TABS
500.0000 mg | ORAL_TABLET | Freq: Four times a day (QID) | ORAL | Status: DC | PRN
  Administered 2023-06-03: 500 mg via ORAL
  Filled 2023-06-03: qty 1

## 2023-06-03 MED ORDER — 0.9 % SODIUM CHLORIDE (POUR BTL) OPTIME
TOPICAL | Status: DC | PRN
  Administered 2023-06-03: 1000 mL

## 2023-06-03 MED ORDER — MELOXICAM 15 MG PO TABS: 15.0000 mg | ORAL_TABLET | Freq: Every day | ORAL | 0 refills | Status: AC

## 2023-06-03 MED ORDER — ONDANSETRON HCL 4 MG/2ML IJ SOLN
INTRAMUSCULAR | Status: AC
Start: 2023-06-03 — End: ?
  Filled 2023-06-03: qty 2

## 2023-06-03 MED ORDER — HYDROMORPHONE HCL 1 MG/ML IJ SOLN
0.2500 mg | INTRAMUSCULAR | Status: DC | PRN
  Administered 2023-06-03 (×3): 0.5 mg via INTRAVENOUS

## 2023-06-03 MED ORDER — KETOROLAC TROMETHAMINE 15 MG/ML IJ SOLN
7.5000 mg | Freq: Four times a day (QID) | INTRAMUSCULAR | Status: DC
  Administered 2023-06-03 – 2023-06-04 (×3): 7.5 mg via INTRAVENOUS
  Filled 2023-06-03 (×3): qty 1

## 2023-06-03 MED ORDER — OXYCODONE HCL 5 MG PO TABS
5.0000 mg | ORAL_TABLET | ORAL | Status: DC | PRN
  Administered 2023-06-03 – 2023-06-04 (×2): 5 mg via ORAL
  Filled 2023-06-03 (×2): qty 1

## 2023-06-03 MED ORDER — DEXAMETHASONE SODIUM PHOSPHATE 10 MG/ML IJ SOLN
INTRAMUSCULAR | Status: DC | PRN
  Administered 2023-06-03: 5 mg via INTRAVENOUS

## 2023-06-03 MED ORDER — LEVOTHYROXINE SODIUM 75 MCG PO TABS
75.0000 ug | ORAL_TABLET | Freq: Every day | ORAL | Status: DC
  Administered 2023-06-04: 75 ug via ORAL
  Filled 2023-06-03: qty 1

## 2023-06-03 MED ORDER — MIDAZOLAM HCL 2 MG/2ML IJ SOLN
INTRAMUSCULAR | Status: AC
  Filled 2023-06-03: qty 2

## 2023-06-03 MED ORDER — SODIUM CHLORIDE (PF) 0.9 % IJ SOLN
INTRAMUSCULAR | Status: AC
Start: 2023-06-03 — End: ?
  Filled 2023-06-03: qty 50

## 2023-06-03 MED ORDER — BUPIVACAINE LIPOSOME 1.3 % IJ SUSP
INTRAMUSCULAR | Status: DC | PRN
  Administered 2023-06-03: 20 mL

## 2023-06-03 MED ORDER — TRANEXAMIC ACID-NACL 1000-0.7 MG/100ML-% IV SOLN
1000.0000 mg | INTRAVENOUS | Status: AC
  Administered 2023-06-03: 1000 mg via INTRAVENOUS
  Filled 2023-06-03: qty 100

## 2023-06-03 MED ORDER — ACETAMINOPHEN 500 MG PO TABS
1000.0000 mg | ORAL_TABLET | Freq: Four times a day (QID) | ORAL | Status: AC
  Administered 2023-06-03 – 2023-06-04 (×4): 1000 mg via ORAL
  Filled 2023-06-03 (×4): qty 2

## 2023-06-03 MED ORDER — METHOCARBAMOL 1000 MG/10ML IJ SOLN: 500.0000 mg | Freq: Four times a day (QID) | INTRAMUSCULAR | Status: DC | PRN

## 2023-06-03 MED ORDER — KETOROLAC TROMETHAMINE 30 MG/ML IJ SOLN: 30.0000 mg | Freq: Once | INTRAMUSCULAR | Status: AC | PRN

## 2023-06-03 MED ORDER — HYDROMORPHONE HCL 1 MG/ML IJ SOLN: 0.5000 mg | INTRAMUSCULAR | Status: DC | PRN

## 2023-06-03 MED ORDER — DIPHENHYDRAMINE HCL 12.5 MG/5ML PO ELIX: 12.5000 mg | ORAL_SOLUTION | ORAL | Status: DC | PRN

## 2023-06-03 MED ORDER — ONDANSETRON HCL 4 MG/2ML IJ SOLN
INTRAMUSCULAR | Status: DC | PRN
  Administered 2023-06-03: 4 mg via INTRAVENOUS

## 2023-06-03 MED ORDER — ONDANSETRON HCL 4 MG PO TABS: 4.0000 mg | ORAL_TABLET | Freq: Three times a day (TID) | ORAL | 0 refills | Status: AC | PRN

## 2023-06-03 MED ORDER — DOCUSATE SODIUM 100 MG PO CAPS
100.0000 mg | ORAL_CAPSULE | Freq: Two times a day (BID) | ORAL | Status: DC
  Administered 2023-06-03 – 2023-06-04 (×2): 100 mg via ORAL
  Filled 2023-06-03 (×2): qty 1

## 2023-06-03 MED ORDER — MIDAZOLAM HCL 5 MG/5ML IJ SOLN
INTRAMUSCULAR | Status: DC | PRN
  Administered 2023-06-03: 2 mg via INTRAVENOUS

## 2023-06-03 MED ORDER — ACETAMINOPHEN 500 MG PO TABS
1000.0000 mg | ORAL_TABLET | Freq: Once | ORAL | Status: AC
  Administered 2023-06-03: 1000 mg via ORAL
  Filled 2023-06-03: qty 2

## 2023-06-03 MED ORDER — ALBUMIN HUMAN 5 % IV SOLN
INTRAVENOUS | Status: AC
  Filled 2023-06-03: qty 250

## 2023-06-03 MED ORDER — METHOCARBAMOL 500 MG PO TABS: 500.0000 mg | ORAL_TABLET | Freq: Three times a day (TID) | ORAL | 0 refills | Status: AC | PRN

## 2023-06-03 MED ORDER — BUPIVACAINE-EPINEPHRINE 0.25% -1:200000 IJ SOLN
INTRAMUSCULAR | Status: AC
Start: 2023-06-03 — End: ?
  Filled 2023-06-03: qty 1

## 2023-06-03 MED ORDER — CEFADROXIL 500 MG PO CAPS: 500.0000 mg | ORAL_CAPSULE | Freq: Two times a day (BID) | ORAL | 0 refills | Status: AC

## 2023-06-03 MED ORDER — SODIUM CHLORIDE 0.9 % IV SOLN: 12.5000 mg | INTRAVENOUS | Status: DC | PRN

## 2023-06-03 MED ORDER — DEXAMETHASONE SODIUM PHOSPHATE 4 MG/ML IJ SOLN: 4.0000 mg | Freq: Once | INTRAMUSCULAR | Status: DC

## 2023-06-03 MED ORDER — VENLAFAXINE HCL ER 75 MG PO CP24
75.0000 mg | ORAL_CAPSULE | Freq: Every day | ORAL | Status: DC
  Administered 2023-06-04: 75 mg via ORAL
  Filled 2023-06-03: qty 1

## 2023-06-03 MED ORDER — PHENOL 1.4 % MT LIQD: 1.0000 | OROMUCOSAL | Status: DC | PRN

## 2023-06-03 MED ORDER — ONDANSETRON HCL 4 MG/2ML IJ SOLN: 4.0000 mg | Freq: Four times a day (QID) | INTRAMUSCULAR | Status: DC | PRN

## 2023-06-03 MED ORDER — CHLORHEXIDINE GLUCONATE 0.12 % MT SOLN
15.0000 mL | Freq: Once | OROMUCOSAL | Status: AC
  Administered 2023-06-03: 15 mL via OROMUCOSAL

## 2023-06-03 MED ORDER — ORAL CARE MOUTH RINSE: 15.0000 mL | Freq: Once | OROMUCOSAL | Status: AC

## 2023-06-03 MED ORDER — FENTANYL CITRATE (PF) 100 MCG/2ML IJ SOLN
INTRAMUSCULAR | Status: AC
  Filled 2023-06-03: qty 2

## 2023-06-03 MED ORDER — ONDANSETRON HCL 4 MG PO TABS: 4.0000 mg | ORAL_TABLET | Freq: Four times a day (QID) | ORAL | Status: DC | PRN

## 2023-06-03 MED ORDER — PROPOFOL 500 MG/50ML IV EMUL
INTRAVENOUS | Status: DC | PRN
  Administered 2023-06-03: 40 mg via INTRAVENOUS
  Administered 2023-06-03: 25 ug/kg/min via INTRAVENOUS

## 2023-06-03 MED ORDER — ASPIRIN 81 MG PO CHEW
81.0000 mg | CHEWABLE_TABLET | Freq: Two times a day (BID) | ORAL | Status: DC
  Administered 2023-06-03 – 2023-06-04 (×2): 81 mg via ORAL
  Filled 2023-06-03 (×2): qty 1

## 2023-06-03 MED ORDER — POLYETHYLENE GLYCOL 3350 17 G PO PACK: 17.0000 g | PACK | Freq: Every day | ORAL | Status: DC | PRN

## 2023-06-03 SURGICAL SUPPLY — 75 items
BAG COUNTER SPONGE SURGICOUNT (BAG) IMPLANT
BAG DECANTER FOR FLEXI CONT (MISCELLANEOUS) ×1 IMPLANT
BAG ZIPLOCK 12X15 (MISCELLANEOUS) ×1 IMPLANT
BIT DRILL TRIDENT 4X25 SU (BIT) IMPLANT
BIT DRILL TRIDENT 4X40 SU (BIT) IMPLANT
BLADE FLEX CHISEL 8 2.5 (MISCELLANEOUS) IMPLANT
BLADE SAW SAG 25X90X1.19 (BLADE) IMPLANT
BLADE SAW SGTL 81X20 HD (BLADE) ×1 IMPLANT
BUR CRS CUT 2.1 (BURR) IMPLANT
BUR DISC 0.8X25 (BURR) IMPLANT
BUR OVAL CARBIDE 4.0 (BURR) IMPLANT
CEMENT BONE SIMPLEX SPEEDSET (Cement) IMPLANT
CHLORAPREP W/TINT 26 (MISCELLANEOUS) ×2 IMPLANT
CNTNR URN SCR LID CUP LEK RST (MISCELLANEOUS) IMPLANT
COVER SURGICAL LIGHT HANDLE (MISCELLANEOUS) ×1 IMPLANT
DERMABOND ADVANCED .7 DNX12 (GAUZE/BANDAGES/DRESSINGS) ×1 IMPLANT
DRAPE 3/4 80X56 (DRAPES) ×2 IMPLANT
DRAPE C-ARM 42X120 X-RAY (DRAPES) ×1 IMPLANT
DRAPE HIP W/POCKET STRL (MISCELLANEOUS) ×1 IMPLANT
DRAPE INCISE IOBAN 66X45 STRL (DRAPES) ×1 IMPLANT
DRAPE INCISE IOBAN 85X60 (DRAPES) ×1 IMPLANT
DRAPE POUCH INSTRU U-SHP 10X18 (DRAPES) ×1 IMPLANT
DRAPE U-SHAPE 47X51 STRL (DRAPES) ×1 IMPLANT
DRSG AQUACEL AG ADV 3.5X10 (GAUZE/BANDAGES/DRESSINGS) ×1 IMPLANT
ELECT BLADE TIP CTD 4 INCH (ELECTRODE) ×1 IMPLANT
ELECT NDL TIP 2.8 STRL (NEEDLE) ×1 IMPLANT
ELECT NEEDLE TIP 2.8 STRL (NEEDLE) ×1 IMPLANT
ELECT REM PT RETURN 15FT ADLT (MISCELLANEOUS) ×1 IMPLANT
GLOVE BIO SURGEON STRL SZ 6.5 (GLOVE) ×2 IMPLANT
GLOVE BIOGEL PI IND STRL 6.5 (GLOVE) ×1 IMPLANT
GLOVE BIOGEL PI IND STRL 8 (GLOVE) ×1 IMPLANT
GLOVE SURG ORTHO 8.0 STRL STRW (GLOVE) ×2 IMPLANT
GOWN STRL REUS W/ TWL XL LVL3 (GOWN DISPOSABLE) ×1 IMPLANT
HEAD BIOLOX HIP 28/+4 (Joint) IMPLANT
HIP BIOLOX HD 28/+4 (Joint) ×1 IMPLANT
HOLDER FOLEY CATH W/STRAP (MISCELLANEOUS) ×1 IMPLANT
HOOD PEEL AWAY T7 (MISCELLANEOUS) ×3 IMPLANT
JET LAVAGE IRRISEPT WOUND (IRRIGATION / IRRIGATOR) ×1
KIT BASIN OR (CUSTOM PROCEDURE TRAY) ×1 IMPLANT
KIT HIP STEM REMOVAL BLADES (KITS) IMPLANT
KIT TURNOVER KIT A (KITS) IMPLANT
LAVAGE JET IRRISEPT WOUND (IRRIGATION / IRRIGATOR) IMPLANT
LINER 42MM E (Orthopedic Implant) IMPLANT
LINER ADM MDM INS 28/48 42E (Liner) IMPLANT
MANIFOLD NEPTUNE II (INSTRUMENTS) ×1 IMPLANT
MARKER SKIN DUAL TIP RULER LAB (MISCELLANEOUS) ×1 IMPLANT
NDL HYPO 22X1.5 SAFETY MO (MISCELLANEOUS) IMPLANT
NEEDLE HYPO 22X1.5 SAFETY MO (MISCELLANEOUS) IMPLANT
NS IRRIG 1000ML POUR BTL (IV SOLUTION) ×1 IMPLANT
PACK TOTAL JOINT (CUSTOM PROCEDURE TRAY) ×1 IMPLANT
PROTECTOR NERVE ULNAR (MISCELLANEOUS) ×1 IMPLANT
RETRIEVER SUT HEWSON (MISCELLANEOUS) ×1 IMPLANT
SCREW HEX LP 6.5X25 (Screw) IMPLANT
SCREW HEX LP 6.5X35 (Screw) IMPLANT
SEALER BIPOLAR AQUA 6.0 (INSTRUMENTS) ×1 IMPLANT
SET HNDPC FAN SPRY TIP SCT (DISPOSABLE) IMPLANT
SHELL MULTIHOLE ACETABULAR 52E (Miscellaneous) IMPLANT
SOLUTION IRRIG SURGIPHOR (IV SOLUTION) ×1 IMPLANT
SPIKE FLUID TRANSFER (MISCELLANEOUS) ×3 IMPLANT
STEM HIGH OFFSET SZ3 36X101 (Stem) IMPLANT
SUCTION TUBE FRAZIER 12FR DISP (SUCTIONS) ×1 IMPLANT
SUT BONE WAX W31G (SUTURE) ×1 IMPLANT
SUT ETHIBOND #5 BRAIDED 30INL (SUTURE) ×1 IMPLANT
SUT MNCRL AB 3-0 PS2 18 (SUTURE) ×1 IMPLANT
SUT STRATAFIX 0 PDS 27 VIOLET (SUTURE) ×1
SUT STRATAFIX 14 PDO 48 VLT (SUTURE) ×1 IMPLANT
SUT STRATAFIX PDO 1 14 VIOLET (SUTURE) ×1
SUT VIC AB 2-0 CT2 27 (SUTURE) ×2 IMPLANT
SUTURE STRATFX 0 PDS 27 VIOLET (SUTURE) ×1 IMPLANT
SYR 20ML LL LF (SYRINGE) ×2 IMPLANT
TOWEL OR 17X26 10 PK STRL BLUE (TOWEL DISPOSABLE) ×1 IMPLANT
TRAY FOLEY MTR SLVR 16FR STAT (SET/KITS/TRAYS/PACK) ×1 IMPLANT
TUBE SUCTION HIGH CAP CLEAR NV (SUCTIONS) ×1 IMPLANT
UNDERPAD 30X36 HEAVY ABSORB (UNDERPADS AND DIAPERS) ×1 IMPLANT
WATER STERILE IRR 1000ML POUR (IV SOLUTION) ×2 IMPLANT

## 2023-06-03 NOTE — Plan of Care (Signed)
  Problem: Education: Goal: Knowledge of General Education information will improve Description: Including pain rating scale, medication(s)/side effects and non-pharmacologic comfort measures Outcome: Progressing   Problem: Activity: Goal: Risk for activity intolerance will decrease Outcome: Progressing   Problem: Nutrition: Goal: Adequate nutrition will be maintained Outcome: Progressing   Problem: Elimination: Goal: Will not experience complications related to bowel motility Outcome: Progressing   Problem: Pain Management: Goal: General experience of comfort will improve Outcome: Progressing   Problem: Education: Goal: Knowledge of the prescribed therapeutic regimen will improve Outcome: Progressing   Problem: Activity: Goal: Ability to avoid complications of mobility impairment will improve Outcome: Progressing   Problem: Pain Management: Goal: Pain level will decrease with appropriate interventions Outcome: Progressing

## 2023-06-03 NOTE — Discharge Instructions (Signed)
INSTRUCTIONS AFTER JOINT REPLACEMENT   Remove items at home which could result in a fall. This includes throw rugs or furniture in walking pathways ICE to the affected joint every three hours while awake for 30 minutes at a time, for at least the first 3-5 days, and then as needed for pain and swelling.  Continue to use ice for pain and swelling. You may notice swelling that will progress down to the foot and ankle.  This is normal after surgery.  Elevate your leg when you are not up walking on it.   Continue to use the breathing machine you got in the hospital (incentive spirometer) which will help keep your temperature down.  It is common for your temperature to cycle up and down following surgery, especially at night when you are not up moving around and exerting yourself.  The breathing machine keeps your lungs expanded and your temperature down.  DIET:  As you were doing prior to hospitalization, we recommend a well-balanced diet.  DRESSING / WOUND CARE / SHOWERING:  Keep the surgical dressing until follow up.  The dressing is water proof, so you can shower without any extra covering.  IF THE DRESSING FALLS OFF or the wound gets wet inside, change the dressing with sterile gauze.  Please use good hand washing techniques before changing the dressing.  Do not use any lotions or creams on the incision until instructed by your surgeon.    ACTIVITY  Increase activity slowly as tolerated, but follow the weight bearing instructions below.   No driving for 6 weeks or until further direction given by your physician.  You cannot drive while taking narcotics.  No lifting or carrying greater than 10 lbs. until further directed by your surgeon. Avoid periods of inactivity such as sitting longer than an hour when not asleep. This helps prevent blood clots.  You may return to work once you are authorized by your doctor.   WEIGHT BEARING: Weight bearing as tolerated with assist device (walker, cane, etc) as  directed, use it as long as suggested by your surgeon or therapist, typically at least 4-6 weeks.  EXERCISES  Results after joint replacement surgery are often greatly improved when you follow the exercise, range of motion and muscle strengthening exercises prescribed by your doctor. Safety measures are also important to protect the joint from further injury. Any time any of these exercises cause you to have increased pain or swelling, decrease what you are doing until you are comfortable again and then slowly increase them. If you have problems or questions, call your caregiver or physical therapist for advice.   Rehabilitation is important following a joint replacement. After just a few days of immobilization, the muscles of the leg can become weakened and shrink (atrophy).  These exercises are designed to build up the tone and strength of the thigh and leg muscles and to improve motion. Often times heat used for twenty to thirty minutes before working out will loosen up your tissues and help with improving the range of motion but do not use heat for the first two weeks following surgery (sometimes heat can increase post-operative swelling).   These exercises can be done on a training (exercise) mat, on the floor, on a table or on a bed. Use whatever works the best and is most comfortable for you.    Use music or television while you are exercising so that the exercises are a pleasant break in your day. This will make your life  better with the exercises acting as a break in your routine that you can look forward to.   Perform all exercises about fifteen times, three times per day or as directed.  You should exercise both the operative leg and the other leg as well.  Exercises include:   Quad Sets - Tighten up the muscle on the front of the thigh (Quad) and hold for 5-10 seconds.   Straight Leg Raises - With your knee straight (if you were given a brace, keep it on), lift the leg to 60 degrees, hold  for 3 seconds, and slowly lower the leg.  Perform this exercise against resistance later as your leg gets stronger.  Leg Slides: Lying on your back, slowly slide your foot toward your buttocks, bending your knee up off the floor (only go as far as is comfortable). Then slowly slide your foot back down until your leg is flat on the floor again.  Angel Wings: Lying on your back spread your legs to the side as far apart as you can without causing discomfort.  Hamstring Strength:  Lying on your back, push your heel against the floor with your leg straight by tightening up the muscles of your buttocks.  Repeat, but this time bend your knee to a comfortable angle, and push your heel against the floor.  You may put a pillow under the heel to make it more comfortable if necessary.   A rehabilitation program following joint replacement surgery can speed recovery and prevent re-injury in the future due to weakened muscles. Contact your doctor or a physical therapist for more information on knee rehabilitation.   CONSTIPATION:  Constipation is defined medically as fewer than three stools per week and severe constipation as less than one stool per week.  Even if you have a regular bowel pattern at home, your normal regimen is likely to be disrupted due to multiple reasons following surgery.  Combination of anesthesia, postoperative narcotics, change in appetite and fluid intake all can affect your bowels.   YOU MUST use at least one of the following options; they are listed in order of increasing strength to get the job done.  They are all available over the counter, and you may need to use some, POSSIBLY even all of these options:    Drink plenty of fluids (prune juice may be helpful) and high fiber foods Colace 100 mg by mouth twice a day  Senokot for constipation as directed and as needed Dulcolax (bisacodyl), take with full glass of water  Miralax (polyethylene glycol) once or twice a day as needed.  If you  have tried all these things and are unable to have a bowel movement in the first 3-4 days after surgery call either your surgeon or your primary doctor.    If you experience loose stools or diarrhea, hold the medications until you stool forms back up.  If your symptoms do not get better within 1 week or if they get worse, check with your doctor.  If you experience "the worst abdominal pain ever" or develop nausea or vomiting, please contact the office immediately for further recommendations for treatment.  ITCHING:  If you experience itching with your medications, try taking only a single pain pill, or even half a pain pill at a time.  You can also use Benadryl over the counter for itching or also to help with sleep.   TED HOSE STOCKINGS:  Use stockings on both legs until for at least 2 weeks or  as directed by physician office. They may be removed at night for sleeping.  MEDICATIONS:  See your medication summary on the "After Visit Summary" that nursing will review with you.  You may have some home medications which will be placed on hold until you complete the course of blood thinner medication.  It is important for you to complete the blood thinner medication as prescribed.  Blood clot prevention (DVT Prophylaxis): After surgery you are at an increased risk for a blood clot. you were prescribed a blood thinner, Aspirin 81mg , to be taken twice daily for a total of 4 weeks from surgery to help reduce your risk of getting a blood clot.  Signs of a pulmonary embolus (blood clot in the lungs) include sudden short of breath, feeling lightheaded or dizzy, chest pain with a deep breath, rapid pulse rapid breathing.  Signs of a blood clot in your arms or legs include new unexplained swelling and cramping, warm, red or darkened skin around the painful area.  Please call the office or 911 right away if these signs or symptoms develop.  PRECAUTIONS:   If you experience chest pain or shortness of breath - call 911  immediately for transfer to the hospital emergency department.   If you develop a fever greater that 101 F, purulent drainage from wound, increased redness or drainage from wound, foul odor from the wound/dressing, or calf pain - CONTACT YOUR SURGEON.                                                   FOLLOW-UP APPOINTMENTS:  If you do not already have a post-op appointment, please call the office for an appointment to be seen by your surgeon.  Guidelines for how soon to be seen are listed in your "After Visit Summary", but are typically between 2-3 weeks after surgery.  If you have a specialized bandage, you may be told to follow up 1 week after surgery.  POST-OPERATIVE OPIOID TAPER INSTRUCTIONS: It is important to wean off of your opioid medication as soon as possible. If you do not need pain medication after your surgery it is ok to stop day one. Opioids include: Codeine, Hydrocodone(Norco, Vicodin), Oxycodone(Percocet, oxycontin) and hydromorphone amongst others.  Long term and even short term use of opiods can cause: Increased pain response Dependence Constipation Depression Respiratory depression And more.  Withdrawal symptoms can include Flu like symptoms Nausea, vomiting And more Techniques to manage these symptoms Hydrate well Eat regular healthy meals Stay active Use relaxation techniques(deep breathing, meditating, yoga) Do Not substitute Alcohol to help with tapering If you have been on opioids for less than two weeks and do not have pain than it is ok to stop all together.  Plan to wean off of opioids This plan should start within one week post op of your joint replacement. Maintain the same interval or time between taking each dose and first decrease the dose.  Cut the total daily intake of opioids by one tablet each day Next start to increase the time between doses. The last dose that should be eliminated is the evening dose.   MAKE SURE YOU:  Understand these  instructions.  Get help right away if you are not doing well or get worse.    Thank you for letting us be a part of your medical care team.  It is a privilege we respect greatly.  We hope these instructions will help you stay on track for a fast and full recovery!

## 2023-06-03 NOTE — Anesthesia Procedure Notes (Signed)
Spinal  Patient location during procedure: OR Start time: 06/03/2023 12:55 PM End time: 06/03/2023 1:00 PM Reason for block: surgical anesthesia Staffing Performed: anesthesiologist  Anesthesiologist: Leilani Able, MD Performed by: Leilani Able, MD Authorized by: Leilani Able, MD   Preanesthetic Checklist Completed: patient identified, IV checked, site marked, risks and benefits discussed, surgical consent, monitors and equipment checked, pre-op evaluation and timeout performed Spinal Block Patient position: sitting Prep: DuraPrep and site prepped and draped Patient monitoring: continuous pulse ox and blood pressure Approach: midline Location: L3-4 Injection technique: single-shot Needle Needle type: Pencan  Needle gauge: 24 G Needle length: 10 cm Needle insertion depth: 6 cm Assessment Sensory level: T6 Events: CSF return

## 2023-06-03 NOTE — Transfer of Care (Signed)
Immediate Anesthesia Transfer of Care Note  Patient: Ebony [name]  Procedure(s) Performed: TOTAL HIP REVISION (Right: Hip)  Patient Location: PACU  Anesthesia Type:MAC combined with regional for post-op pain  Level of Consciousness: awake and alert   Airway & Oxygen Therapy: Patient Spontanous Breathing and Patient connected to face mask oxygen  Post-op Assessment: Report given to RN and Post -op Vital signs reviewed and stable  Post vital signs: Reviewed and stable  Last Vitals:  Vitals Value Taken Time  BP    Temp    Pulse 90 06/03/23 1634  Resp 12 06/03/23 1634  SpO2 99 % 06/03/23 1634  Vitals shown include unfiled device data.  Last Pain:  Vitals:   06/03/23 1057  TempSrc:   PainSc: 0-No pain         Complications: No notable events documented.

## 2023-06-03 NOTE — Interval H&P Note (Signed)
The patient has been re-examined, and the chart reviewed, and there have been no interval changes to the documented history and physical.    Plan for R revision THA for aseptic femoral loosening  The operative side was examined and the patient was confirmed to have sensation to DPN, SPN, TN intact, Motor EHL, ext, flex 5/5, and DP 2+, PT 2+, No significant edema.   The risks, benefits, and alternatives have been discussed at length with patient, and the patient is willing to proceed.  Right hip marked. Consent has been signed.

## 2023-06-03 NOTE — Op Note (Signed)
06/03/2023  4:22 PM  PATIENT:  Ebony [name]   MRN: [id number]  PRE-OPERATIVE DIAGNOSIS: Aseptic loosening right total hip arthroplasty  POST-OPERATIVE DIAGNOSIS:  same  PROCEDURE: Revision right total of arthroplasty both components  PREOPERATIVE INDICATIONS:    Ebony [name] is an 57 y.o. female who has a diagnosis of Aseptic loosening right total hip arthroplasty after undergoing right total of arthroplasty back in 2021.  Reports early postoperative course was complicated by instability and a dislocation.  She continued have pain discomfort after surgery.  Never really did well.  Workup including aspiration on 6/25 was negative for infection.  She did have imaging including x-rays and CT scan that showed increased loosening around the acetabular component as well as an undersized femoral components concern for possible loosening here as well.  Given continued pain and disability she elected for surgical management after failing conservative treatment.  The risks benefits and alternatives were discussed with the patient including but not limited to the risks of nonoperative treatment, versus surgical intervention including infection, bleeding, nerve injury, periprosthetic fracture, the need for revision surgery, dislocation, leg length discrepancy, blood clots, cardiopulmonary complications, morbidity, mortality, among others, and they were willing to proceed.     OPERATIVE REPORT     SURGEON:  Weber Cooks, MD    ASSISTANT: Kathie Dike, PA-C, (Present throughout the entire procedure,  necessary for completion of procedure in a timely manner, assisting with retraction, instrumentation, and closure)     ANESTHESIA: Spinal  ESTIMATED BLOOD LOSS: 500cc    COMPLICATIONS:  None.     COMPONENTS:   Stryker Trident 252 mm TriTanium multihole acetabular shell, alpha code E MDM liner cementless, ADM/MDM 40 to the dual mobility liner, 28+4 ceramic head, insignia size 3 high  offset stem Implant Name Type Inv. Item Serial No. Manufacturer Lot No. LRB No. Used Action  SHELL MULTIHOLE ACETABULAR 52E - [id number] Miscellaneous SHELL MULTIHOLE ACETABULAR 52E  [employer] [id number] Right 1 Implanted  LINER E - [id number] Orthopedic Implant LINER E  [employer] [id number] Right 1 Implanted  SCREW HEX LP 6.5X25 - [id number] Screw SCREW HEX LP 6.5X25  [employer] JECA Right 1 Implanted  SCREW HEX LP 6.5X35 - [id number] Screw SCREW HEX LP 6.5X35  [employer] [id number] Right 1 Implanted  LINER ADM MDM INS 28/48 42E - [id number] Liner LINER ADM MDM INS 28/48 42E  [employer] [id number] Right 1 Implanted  HIP BIOLOX HD 28/+4 - [id number] Joint HIP BIOLOX HD 28/+4  [employer] [id number] Right 1 Implanted  STEM HIGH OFFSET SZ3 36X101 - [id number] Stem STEM HIGH OFFSET SZ3 36X101  [employer] [id number] Right 1 Implanted    The aquamantis was utilized for this case to help facilitate better hemostasis as patient was felt to be at increased risk of bleeding because of complex case requiring increased OR time and/or exposure.        PROCEDURE IN DETAIL:   The patient was met in the holding area and  identified.  The appropriate hip was identified and marked at the operative site.  The patient was then transported to the OR  and  placed under anesthesia.  At that point, the patient was  placed in the lateral decubitus position with the operative side up and  secured to the operating room table  and all bony prominences padded. A subaxillary role was also placed.    The operative lower extremity was prepped from the iliac crest to the distal leg.  Sterile draping was performed.  Preoperative antibiotics, 2 gm of ancef,1 gm of Tranexamic Acid, and 8 mg of Decadron administered. Time out was performed prior to incision.      A routine posterolateral approach was utilized via sharp dissection  carried down to the subcutaneous tissue.   Gross bleeders were Bovie coagulated.  The iliotibial band was identified and incised along the length of the skin incision through the glute max fascia.  Charnley retractor was placed with care to protect the sciatic nerve posteriorly.  With the hip internally rotated, the piriformis tendon was identified and released from the femoral insertion and tagged with a #5 Ethibond.  A capsulotomy was then performed off the femoral insertion and also tagged with a #5 Ethibond.  There was small amount of serous drainage expressed from the hip joint.  3 intra articular synovial soft tissue specimens were obtained and sent for routine aerobic anaerobic culture.  First we turned our attention to assess stability of the hip.  The hip was found to have no anterior subluxation with extension and external rotation however at forward flexion up to 90 internal rotation of 20 degrees there was impingement and dislocation.  We then dislocated the hip and used a bone tamp to remove the femoral head.  There was nowhere to the trunnion.  A 28 mm head ball was removed.  The femoral component although did not show gross loosening was felt to be undersized and concerned that there was incomplete ingrowth possibly contributing to her pain.  We used the exodus blade system to work around the femoral component circumferentially.  In order to get through the medial part where we felt that she likely had her ingrowth a high-speed bur was used to cut off the collar.  Once the collar was cut off early get the blade around medially and able to remove the femoral component without difficulty.  There was minimal proximal bone loss.  Next we turned our attention to the acetabular component.  The acetabular offset liner was removed and a neutral polyethylene liner was inserted.  Once we have adequate acetabular exposure a 46 mm short curved osteotome was passed circumferentially around the acetabular shell.  With a single pass other than some  ingrowth posteriorly the acetabular component component easily fell out.  We next turned our attention to preparation of the the acetabulum for a larger shell.  We sequentially reamed Starting with a 46 mm reamer, first medializing to the floor of the cotyloid fossa, and then in the position of the cup aiming towards the greater sciatic notch, matching the version of the transverse acetabular ligament and tucked under the anterior wall. I reamed up to 52 mm reamer with good bony bed preparation and a 52 mm cup was chosen.  The real cup was then impacted into place.  Appropriate version and inclination was confirmed clinically matching their bony anatomy, and also with the use of the jig.  I placed 2 screws in the posterior superior quadrant to augment fixation.  A MDM liner was placed and impacted. It was confirmed to be appropriately seated and the acetabular retractors were removed.    Given minimal proximal bone loss elected to first try the insignia with the ResMed available as a backup stem.  We sequentially broached up to a size 3 stem which had excellent fit.  This was confirmed with fluoroscopy.  Given good cortical fit circumferentially felt this would be adequate fixation for ingrowth.  A trial broach, neck,  and head was utilized, and I reduced the hip and it was found to have excellent stability.  There was no impingement with full extension and 90 degrees external rotation.  The hip was stable at the position of sleep and with 90 degrees flexion and 60degrees of internal rotation.  Leg lengths were also clinically assessed in the lateral position and felt to be equal. Intra-Op flatplate was obtained and confirmed appropriate component positions.  Good fill of the femur with the size 3 broach.  And restoration of leg length and offset. No evidence or concern for fracture.  A final femoral prosthesis size 3 was selected. I then impacted the real femoral prosthesis into place.I again trialed and  selected a dual mobility +4 mm ball. The hip was then reduced and taken through a range of motion. There was no impingement with full extension and 90 degrees external rotation.  The hip was stable at the position of sleep and with 90 degrees flexion and 60 degrees of internal rotation. Leg lengths were  again assessed and felt to be restored.  We then opened, and I impacted the real head ball into place.  The posterior capsule was then closed with #5 Ethibond.     I then irrigated the hip copiously with Irrisept irrigation and with normal saline pulse lavage. Periarticular injection was then performed with Exparel.   We repaired the fascia #1 barbed suture, followed by 0 barbed suture for the subcutaneous fat.  Skin was closed with 2-0 Vicryl and 3-0 Monocryl.  Dermabond and Aquacel dressing were applied. The patient was then awakened and returned to PACU in stable and satisfactory condition.  Leg lengths in the supine position were assessed and felt to be clinically equal. There were no complications.  Post op recs: WB: 50% partial weightbearing with posterior hip precautions x 6 weeks Abx: ancef in-house, follow-up IntraOp cultures, discharge on cefadroxil 500 twice daily Imaging: PACU pelvis Xray Dressing: Aquacell, keep intact until follow up DVT prophylaxis: Aspirin 81BID starting POD1 Follow up: 2 weeks after surgery for a wound check with Dr. Blanchie Dessert at [facility].  Address: [address], [place], [place] [address]  Office Phone: [phone]   Weber Cooks, MD Orthopedic Surgeon

## 2023-06-04 DIAGNOSIS — T84090A Other mechanical complication of internal right hip prosthesis, initial encounter: Secondary | ICD-10-CM | POA: Diagnosis not present

## 2023-06-04 LAB — BASIC METABOLIC PANEL
Anion gap: 8 (ref 5–15)
BUN: 11 mg/dL (ref 6–20)
CO2: 23 mmol/L (ref 22–32)
Calcium: 8.3 mg/dL — ABNORMAL LOW (ref 8.9–10.3)
Chloride: 101 mmol/L (ref 98–111)
Creatinine, Ser: 0.61 mg/dL (ref 0.44–1.00)
GFR, Estimated: >60 mL/min (ref >60)
Glucose, Bld: 187 mg/dL — ABNORMAL HIGH (ref 70–99)
Potassium: 3.7 mmol/L (ref 3.5–5.1)
Sodium: 132 mmol/L — ABNORMAL LOW (ref 135–145)

## 2023-06-04 LAB — CBC
HCT: 34.2 % — ABNORMAL LOW (ref 36.0–46.0)
Hemoglobin: 10.8 g/dL — ABNORMAL LOW (ref 12.0–15.0)
MCH: 27.5 pg (ref 26.0–34.0)
MCHC: 31.6 g/dL (ref 30.0–36.0)
MCV: 87 fL (ref 80.0–100.0)
Platelets: 272 10*3/uL (ref 150–400)
RBC: 3.93 MIL/uL (ref 3.87–5.11)
RDW: 13.4 % (ref 11.5–15.5)
WBC: 14.9 10*3/uL — ABNORMAL HIGH (ref 4.0–10.5)
nRBC: 0 % (ref 0.0–0.2)

## 2023-06-04 LAB — GLUCOSE, CAPILLARY: Glucose-Capillary: 92 mg/dL (ref 70–99)

## 2023-06-04 NOTE — Progress Notes (Signed)
Discharge package printed and instructions given to pt. Pt verbalizes understanding. 

## 2023-06-04 NOTE — Evaluation (Signed)
 Physical Therapy Evaluation Patient Details Name: Ebony [name] MRN: [id number] DOB: [birth year] Today's Date: 06/04/2023  History of Present Illness  57 yo female who has a diagnosis of aseptic loosening right THA, s/p posterior revision R THA on 06/03/23.  PMH: right THA 2021, DM, SVT, obesity  Clinical Impression  Pt admitted with above diagnosis. Pt motivated and cooperative with PT. Reviewed mobility  as noted below,  posterior hip precautions and PWB. Will see again this pm, pt will likely be ready to d/c later today. Pt currently with functional limitations due to the deficits listed below (see PT Problem List). Pt will benefit from acute skilled PT to increase their independence and safety with mobility to allow discharge.           If plan is discharge home, recommend the following: Help with stairs or ramp for entrance;A little help with bathing/dressing/bathroom;Assistance with cooking/housework;Assist for transportation   Can travel by private vehicle        Equipment Recommendations None recommended by PT  Recommendations for Other Services       Functional Status Assessment Patient has had a recent decline in their functional status and demonstrates the ability to make significant improvements in function in a reasonable and predictable amount of time.     Precautions / Restrictions Precautions Precautions: Posterior Hip Precaution Comments: handout reviewed Restrictions Weight Bearing Restrictions Per Provider Order: Yes RLE Weight Bearing Per Provider Order: Partial weight bearing RLE Partial Weight Bearing Percentage or Pounds: 50%      Mobility  Bed Mobility Overal bed mobility: Needs Assistance Bed Mobility: Supine to Sit     Supine to sit: Supervision     General bed mobility comments: able to use gait belt as leg lifter, cues for posterior THP    Transfers Overall transfer level: Needs assistance Equipment used: Rolling walker (2  wheels) Transfers: Sit to/from Stand Sit to Stand: Supervision           General transfer comment: cues for THP, RLE position    Ambulation/Gait Ambulation/Gait assistance: Supervision, Contact guard assist Gait Distance (Feet): 100 Feet Assistive device: Rolling walker (2 wheels) Gait Pattern/deviations: Step-to pattern       General Gait Details: cues for PWB, sequence  Stairs            Wheelchair Mobility     Tilt Bed    Modified Rankin (Stroke Patients Only)       Balance                                             Pertinent Vitals/Pain Pain Assessment Pain Assessment: 0-10 Pain Score: 2  Pain Location: right hip Pain Descriptors / Indicators: Discomfort Pain Intervention(s): Limited activity within patient's tolerance, Monitored during session, Premedicated before session, Repositioned    Home Living Family/patient expects to be discharged to:: Private residence Living Arrangements: Spouse/significant other;Children Available Help at Discharge: Family   Home Access: Stairs to enter Entrance Stairs-Rails: None Entrance Stairs-Number of Steps: 2   Home Layout: Two level;Able to live on main level with bedroom/bathroom Home Equipment: Rolling Walker (2 wheels);Shower seat;Toilet riser      Prior Function Prior Level of Function : Independent/Modified Independent                     Extremity/Trunk Assessment   Upper Extremity Assessment Upper Extremity  Assessment: Overall WFL for tasks assessed    Lower Extremity Assessment Lower Extremity Assessment: RLE deficits/detail RLE Deficits / Details: grossly 3+/5,limited by post op discomfort and precautions       Communication   Communication Communication: No apparent difficulties  Cognition Arousal: Alert Behavior During Therapy: WFL for tasks assessed/performed Overall Cognitive Status: Within Functional Limits for tasks assessed                                           General Comments      Exercises Total Joint Exercises Ankle Circles/Pumps: AROM, Both, 5 reps   Assessment/Plan    PT Assessment Patient needs continued PT services  PT Problem List Decreased strength;Decreased activity tolerance;Decreased balance;Decreased mobility;Decreased knowledge of precautions;Decreased knowledge of use of DME;Pain       PT Treatment Interventions DME instruction;Gait training;Stair training;Functional mobility training;Therapeutic exercise;Patient/family education    PT Goals (Current goals can be found in the Care Plan section)  Acute Rehab PT Goals PT Goal Formulation: With patient Time For Goal Achievement: 06/17/23 Potential to Achieve Goals: Good    Frequency 7X/week     Co-evaluation               AM-PAC PT 6 Clicks Mobility  Outcome Measure Help needed turning from your back to your side while in a flat bed without using bedrails?: A Little Help needed moving from lying on your back to sitting on the side of a flat bed without using bedrails?: A Little Help needed moving to and from a bed to a chair (including a wheelchair)?: A Little Help needed standing up from a chair using your arms (e.g., wheelchair or bedside chair)?: A Little Help needed to walk in hospital room?: A Little Help needed climbing 3-5 steps with a railing? : A Little 6 Click Score: 18    End of Session Equipment Utilized During Treatment: Gait belt Activity Tolerance: Patient tolerated treatment well Patient left: with call bell/phone within reach;in chair;with chair alarm set;with family/visitor present Nurse Communication: Mobility status PT Visit Diagnosis: Other abnormalities of gait and mobility (R26.89)    Time: 8973-8942 PT Time Calculation (min) (ACUTE ONLY): 31 min   Charges:   PT Evaluation $PT Eval Low Complexity: 1 Low PT Treatments $Gait Training: 8-22 mins PT General Charges $$ ACUTE PT VISIT: 1  Visit         Atreus Hasz, PT  Acute Rehab Dept ([facility]) [phone]  06/04/2023   [facility] 06/04/2023, 11:04 AM

## 2023-06-04 NOTE — Progress Notes (Signed)
 Physical Therapy Treatment Patient Details Name: Ebony [name] MRN: [id number] DOB: 02-07-1966 Today's Date: 06/04/2023   History of Present Illness 57 yo female who has a diagnosis of aseptic loosening right THA, s/p posterior revision R THA on 06/03/23.  PMH: right THA 2021, DM, SVT, obesity    PT Comments  Pt meeting PT goals. Husband present for session. Reviewed mobility as noted below, post THP and PWB as well as reasoning for precautions. Pt and spouse  verbalize understand. Reviewed slower progression of activity at home. Pt ready to d/c home with spouse assisting prn   If plan is discharge home, recommend the following: Help with stairs or ramp for entrance;A little help with bathing/dressing/bathroom;Assistance with cooking/housework;Assist for transportation   Can travel by private vehicle        Equipment Recommendations  None recommended by PT    Recommendations for Other Services       Precautions / Restrictions Precautions Precautions: Posterior Hip Precaution Comments: handout reviewed Restrictions RLE Weight Bearing Per Provider Order: Partial weight bearing RLE Partial Weight Bearing Percentage or Pounds: 50%     Mobility  Bed Mobility Overal bed mobility: Needs Assistance Bed Mobility: Supine to Sit     Supine to sit: Supervision     General bed mobility comments: in recliner    Transfers Overall transfer level: Needs assistance Equipment used: Rolling walker (2 wheels) Transfers: Sit to/from Stand Sit to Stand: Modified independent (Device/Increase time), Supervision           General transfer comment: cues for THP, RLE position    Ambulation/Gait Ambulation/Gait assistance: Supervision Gait Distance (Feet): 140 Feet Assistive device: Rolling walker (2 wheels) Gait Pattern/deviations: Step-to pattern       General Gait Details: cues for PWB, sequence, good adherence   Stairs Stairs: Yes Stairs assistance: Contact guard  assist Stair Management: No rails, Backwards, With walker, Step to pattern Number of Stairs: 2 General stair comments: cues for sequence, PWB and technique   Wheelchair Mobility     Tilt Bed    Modified Rankin (Stroke Patients Only)       Balance                                            Cognition Arousal: Alert Behavior During Therapy: WFL for tasks assessed/performed Overall Cognitive Status: Within Functional Limits for tasks assessed                                          Exercises Total Joint Exercises Ankle Circles/Pumps: AROM, Both, 5 reps Quad Sets: AROM, Both (3 reps) Heel Slides: AAROM, Right, 5 reps Hip ABduction/ADduction: AAROM, Right, 10 reps    General Comments        Pertinent Vitals/Pain Pain Assessment Pain Assessment: 0-10 Pain Score: 2  Pain Location: right hip Pain Descriptors / Indicators: Discomfort Pain Intervention(s): Limited activity within patient's tolerance, Monitored during session, Ice applied, Repositioned    Home Living                          Prior Function            PT Goals (current goals can now be found in the care plan section) Acute Rehab PT  Goals PT Goal Formulation: With patient Time For Goal Achievement: 06/17/23 Potential to Achieve Goals: Good Progress towards PT goals: Progressing toward goals    Frequency    7X/week      PT Plan      Co-evaluation              AM-PAC PT 6 Clicks Mobility   Outcome Measure  Help needed turning from your back to your side while in a flat bed without using bedrails?: A Little Help needed moving from lying on your back to sitting on the side of a flat bed without using bedrails?: A Little Help needed moving to and from a bed to a chair (including a wheelchair)?: A Little Help needed standing up from a chair using your arms (e.g., wheelchair or bedside chair)?: A Little Help needed to walk in hospital  room?: A Little Help needed climbing 3-5 steps with a railing? : A Little 6 Click Score: 18    End of Session Equipment Utilized During Treatment: Gait belt Activity Tolerance: Patient tolerated treatment well Patient left: in chair;with call bell/phone within reach;with chair alarm set;with family/visitor present Nurse Communication: Mobility status PT Visit Diagnosis: Other abnormalities of gait and mobility (R26.89)     Time: 8578-8545 PT Time Calculation (min) (ACUTE ONLY): 33 min  Charges:    $Gait Training: 23-37 mins PT General Charges $$ ACUTE PT VISIT: 1 Visit                     Patton Rabinovich, PT  Acute Rehab Dept [facility]) [phone]  06/04/2023    [facility] 06/04/2023, 3:44 PM

## 2023-06-04 NOTE — Progress Notes (Signed)
     Subjective:  Patient reports pain as mild.  She reports she did not sleep much overnight.  Denies distal numbness and tingling.  She has not yet worked with physical therapy.  Eager to mobilize today and hopeful to go home.  IntraOp cultures pending, Gram stain negative.  Objective:   VITALS:   Vitals:   06/03/23 1831 06/03/23 2123 06/04/23 0144 06/04/23 0524  BP: (!) 140/85 122/78 123/78 116/65  Pulse: 70 73 74 80  Resp: 18 17 17 17   Temp: (!) 97.4 F (36.3 C) (!) 97.5 F (36.4 C) 97.7 F (36.5 C) 97.8 F (36.6 C)  TempSrc: Oral Oral Oral Oral  SpO2: 99% 97% 96% 99%  Weight:      Height:        Sensation intact distally Intact pulses distally Dorsiflexion/Plantar flexion intact Incision: dressing C/D/I Compartment soft    Lab Results  Component Value Date   WBC 14.9 (H) 06/04/2023   HGB 10.8 (L) 06/04/2023   HCT 34.2 (L) 06/04/2023   MCV 87.0 06/04/2023   PLT 272 06/04/2023   BMET    Component Value Date/Time   NA 132 (L) 06/04/2023 0319   NA 142 12/15/2018 1435   K 3.7 06/04/2023 0319   CL 101 06/04/2023 0319   CO2 23 06/04/2023 0319   GLUCOSE 187 (H) 06/04/2023 0319   BUN 11 06/04/2023 0319   BUN 21 12/15/2018 1435   CREATININE 0.61 06/04/2023 0319   CREATININE 0.80 07/13/2013 1639   CALCIUM 8.3 (L) 06/04/2023 0319   GFRNONAA >60 06/04/2023 0319      Xray: THA components in good position no adverse features  Assessment/Plan: 1 Day Post-Op   Principal Problem:   Failed total hip arthroplasty (HCC)  Post op recs: WB: 50% partial weightbearing with posterior hip precautions x 6 weeks Abx: ancef  in-house, follow-up IntraOp cultures, discharge on cefadroxil  500 twice daily Imaging: PACU pelvis Xray Dressing: Aquacell, keep intact until follow up DVT prophylaxis: Aspirin  81BID starting POD1 Follow up: 2 weeks after surgery for a wound check with Dr. Edna at [facility].  Address: [address], [place],  [place] [address]  Office Phone: [phone]      TORIBIO DELENA EDNA 06/04/2023, 7:24 AM   Toribio Edna, MD  Contact information:   [phone] 7am-5pm epic message Dr. Edna, or call office for patient follow up: [phone] After hours and holidays please check [email or web address] for group call information for [facility]

## 2023-06-04 NOTE — Discharge Summary (Signed)
 Physician Discharge Summary  Patient ID: Ebony [name] MRN: [id number] DOB/AGE: 1966/02/13 57 y.o.  Admit date: 06/03/2023 Discharge date: 06/04/2023  Admission Diagnoses:  Failed total hip arthroplasty [facility])  Discharge Diagnoses:  Principal Problem:   Failed total hip arthroplasty [facility])   Past Medical History:  Diagnosis Date   Arthritis    hips     BCC (basal cell carcinoma), face    skin    Concussion with no loss of consciousness 12/28/2014   Depression with anxiety 10/06/2012   Diabetes mellitus without complication (HCC)    no meds--- reports as borderline , last a1c 01-05-2019 with PCP 6.3%   GERD (gastroesophageal reflux disease) 09/05/2015   MGD WITH PPI    Headache(784.0)    otc meds prn, migraine, NOW 1-2 TIMES ANNUALLY    Hyperlipidemia, mixed 12/28/2014   NOT ON ANY MEDICATIONS UPDATED 12/04/21   Hypertension    only with preg. 67yrs ago 2002- no meds   Hypothyroid 06/15/2012   Obesity 09/05/2015   Post-operative nausea and vomiting    SVT (supraventricular tachycardia) (HCC)    Tinea pedis 12/28/2014    Surgeries: Procedure(s): TOTAL HIP REVISION on 06/03/2023   Consultants (if any):   Discharged Condition: Improved  Hospital Course: Ebony [name] is an 57 y.o. female who was admitted 06/03/2023 with a diagnosis of Failed total hip arthroplasty (HCC) and went to the operating room on 06/03/2023 and underwent the above named procedures.    She was given perioperative antibiotics:  Anti-infectives (From admission, onward)    Start     Dose/Rate Route Frequency Ordered Stop   06/03/23 2000  ceFAZolin  (ANCEF ) IVPB 2g/100 mL premix        2 g 200 mL/hr over 30 Minutes Intravenous Every 8 hours 06/03/23 1827 06/06/23 2159   06/03/23 1100  ceFAZolin  (ANCEF ) IVPB 2g/100 mL premix        2 g 200 mL/hr over 30 Minutes Intravenous On call to O.R. 06/03/23 1046 06/03/23 1320   06/03/23 0000  cefadroxil  (DURICEF) 500 MG capsule        500 mg Oral 2  times daily 06/03/23 1632 06/10/23 2359     .  She was given sequential compression devices, early ambulation, and aspirin  for DVT prophylaxis.  She benefited maximally from the hospital stay and there were no complications.    Recent vital signs:  Vitals:   06/04/23 0144 06/04/23 0524  BP: 123/78 116/65  Pulse: 74 80  Resp: 17 17  Temp: 97.7 F (36.5 C) 97.8 F (36.6 C)  SpO2: 96% 99%    Recent laboratory studies:  Lab Results  Component Value Date   HGB 10.8 (L) 06/04/2023   HGB 12.5 06/03/2023   HGB 13.5 05/21/2023   Lab Results  Component Value Date   WBC 14.9 (H) 06/04/2023   PLT 272 06/04/2023   No results found for: INR Lab Results  Component Value Date   NA 132 (L) 06/04/2023   K 3.7 06/04/2023   CL 101 06/04/2023   CO2 23 06/04/2023   BUN 11 06/04/2023   CREATININE 0.61 06/04/2023   GLUCOSE 187 (H) 06/04/2023    Discharge Medications:   Allergies as of 06/04/2023       Reactions   Sulfamethoxazole-trimethoprim Nausea And Vomiting, Other (See Comments)   headache        Medication List     STOP taking these medications    ibuprofen  200 MG tablet Commonly known as: ADVIL   TAKE these medications    acetaminophen  500 MG tablet Commonly known as: TYLENOL  Take 2 tablets (1,000 mg total) by mouth every 8 (eight) hours as needed.   aspirin  EC 81 MG tablet Take 1 tablet (81 mg total) by mouth 2 (two) times daily for 28 days. Swallow whole.   cefadroxil  500 MG capsule Commonly known as: DURICEF Take 1 capsule (500 mg total) by mouth 2 (two) times daily for 7 days.   levothyroxine  75 MCG tablet Commonly known as: SYNTHROID  TAKE 1 TABLET BY MOUTH EVERY DAY BEFORE BREAKFAST What changed: See the new instructions.   meloxicam  15 MG tablet Commonly known as: MOBIC  Take 1 tablet (15 mg total) by mouth daily for 15 days.   methocarbamol  500 MG tablet Commonly known as: ROBAXIN  Take 1 tablet (500 mg total) by mouth every 8  (eight) hours as needed for up to 10 days for muscle spasms.   METHYLFOLATE PO Take 15 mg by mouth daily.   ondansetron  4 MG tablet Commonly known as: Zofran  Take 1 tablet (4 mg total) by mouth every 8 (eight) hours as needed for up to 14 days for nausea or vomiting.   oxyCODONE  5 MG immediate release tablet Commonly known as: Roxicodone  Take 1 tablet (5 mg total) by mouth every 4 (four) hours as needed for up to 7 days for severe pain (pain score 7-10) or moderate pain (pain score 4-6).   pantoprazole  40 MG tablet Commonly known as: PROTONIX  TAKE 1 TABLET BY MOUTH TWICE A DAY What changed: when to take this   polyethylene glycol 17 g packet Commonly known as: MiraLax  Take 17 g by mouth daily.   SUMAtriptan  50 MG tablet Commonly known as: Imitrex  Take 1 tablet (50 mg total) by mouth every 2 (two) hours as needed for migraine or headache (max of 2 tabs in 24 hours). May repeat in 2 hours if headache persists or recurs.   tirzepatide  7.5 MG/0.5ML Pen Commonly known as: MOUNJARO  Inject 7.5 mg into the skin once a week.   venlafaxine  XR 75 MG 24 hr capsule Commonly known as: EFFEXOR -XR TAKE 1 CAPSULE BY MOUTH DAILY WITH BREAKFAST.   vitamin C 1000 MG tablet Take 1,000 mg by mouth every evening.        Diagnostic Studies: DG HIP UNILAT W OR W/O PELVIS 2-3 VIEWS RIGHT Result Date: 06/03/2023 CLINICAL DATA:  Status post right hip revision.  Postop. EXAM: DG HIP (WITH OR WITHOUT PELVIS) 2-3V RIGHT COMPARISON:  Preoperative imaging. FINDINGS: New right hip arthroplasty in expected alignment. No periprosthetic lucency or fracture. Recent postsurgical change includes air and edema in the soft tissues. IMPRESSION: Right hip arthroplasty without immediate postoperative complication. Electronically Signed   By: Andrea Gasman M.D.   On: 06/03/2023 18:27   DG HIP UNILAT WITH PELVIS 1V RIGHT Result Date: 06/03/2023 CLINICAL DATA:  Elective surgery.  Right hip revision. EXAM: DG HIP  (WITH OR WITHOUT PELVIS) 1V RIGHT COMPARISON:  CT 12/13/2022 FINDINGS: Four fluoroscopic spot views of the pelvis and right hip obtained in the operating room. Sequential images during hip surgery. Fluoroscopy time 4 seconds. Dose 2.14 mGy. IMPRESSION: Intraoperative fluoroscopy during right hip surgery. Electronically Signed   By: Andrea Gasman M.D.   On: 06/03/2023 17:00   DG C-Arm 1-60 Min-No Report Result Date: 06/03/2023 Fluoroscopy was utilized by the requesting physician.  No radiographic interpretation.    Disposition: Discharge disposition: 01-Home or Self Care       Discharge Instructions  Call MD / Call 911   Complete by: As directed    If you experience chest pain or shortness of breath, CALL 911 and be transported to the hospital emergency room.  If you develope a fever above 101 F, pus (white drainage) or increased drainage or redness at the wound, or calf pain, call your surgeon's office.   Constipation Prevention   Complete by: As directed    Drink plenty of fluids.  Prune juice may be helpful.  You may use a stool softener, such as Colace (over the counter) 100 mg twice a day.  Use MiraLax  (over the counter) for constipation as needed.   Diet - low sodium heart healthy   Complete by: As directed    Driving restrictions   Complete by: As directed    No driving for 4-6 weeks   Follow the hip precautions as taught in Physical Therapy   Complete by: As directed    Increase activity slowly as tolerated   Complete by: As directed    Post-operative opioid taper instructions:   Complete by: As directed    POST-OPERATIVE OPIOID TAPER INSTRUCTIONS: It is important to wean off of your opioid medication as soon as possible. If you do not need pain medication after your surgery it is ok to stop day one. Opioids include: Codeine, Hydrocodone (Norco, Vicodin), Oxycodone (Percocet, oxycontin ) and hydromorphone  amongst others.  Long term and even short term use of opiods can  cause: Increased pain response Dependence Constipation Depression Respiratory depression And more.  Withdrawal symptoms can include Flu like symptoms Nausea, vomiting And more Techniques to manage these symptoms Hydrate well Eat regular healthy meals Stay active Use relaxation techniques(deep breathing, meditating, yoga) Do Not substitute Alcohol  to help with tapering If you have been on opioids for less than two weeks and do not have pain than it is ok to stop all together.  Plan to wean off of opioids This plan should start within one week post op of your joint replacement. Maintain the same interval or time between taking each dose and first decrease the dose.  Cut the total daily intake of opioids by one tablet each day Next start to increase the time between doses. The last dose that should be eliminated is the evening dose.      TED hose   Complete by: As directed    Use stockings (TED hose) for 2 weeks on both leg(s).  Then for 2 more weeks on the surgical leg.  You may remove them at night for sleeping.        Follow-up Information     Edna Toribio LABOR, MD Follow up in 2 week(s).   Specialty: Orthopedic Surgery Contact information: [address] [place] [address] [phone]                    Discharge Instructions      INSTRUCTIONS AFTER JOINT REPLACEMENT   Remove items at home which could result in a fall. This includes throw rugs or furniture in walking pathways ICE to the affected joint every three hours while awake for 30 minutes at a time, for at least the first 3-5 days, and then as needed for pain and swelling.  Continue to use ice for pain and swelling. You may notice swelling that will progress down to the foot and ankle.  This is normal after surgery.  Elevate your leg when you are not up walking on it.   Continue to  use the breathing machine you got in the hospital (incentive spirometer) which will help keep your  temperature down.  It is common for your temperature to cycle up and down following surgery, especially at night when you are not up moving around and exerting yourself.  The breathing machine keeps your lungs expanded and your temperature down.  DIET:  As you were doing prior to hospitalization, we recommend a well-balanced diet.  DRESSING / WOUND CARE / SHOWERING:  Keep the surgical dressing until follow up.  The dressing is water  proof, so you can shower without any extra covering.  IF THE DRESSING FALLS OFF or the wound gets wet inside, change the dressing with sterile gauze.  Please use good hand washing techniques before changing the dressing.  Do not use any lotions or creams on the incision until instructed by your surgeon.    ACTIVITY  Increase activity slowly as tolerated, but follow the weight bearing instructions below.   No driving for 6 weeks or until further direction given by your physician.  You cannot drive while taking narcotics.  No lifting or carrying greater than 10 lbs. until further directed by your surgeon. Avoid periods of inactivity such as sitting longer than an hour when not asleep. This helps prevent blood clots.  You may return to work once you are authorized by your doctor.   WEIGHT BEARING: Weight bearing as tolerated with assist device (walker, cane, etc) as directed, use it as long as suggested by your surgeon or therapist, typically at least 4-6 weeks.  EXERCISES  Results after joint replacement surgery are often greatly improved when you follow the exercise, range of motion and muscle strengthening exercises prescribed by your doctor. Safety measures are also important to protect the joint from further injury. Any time any of these exercises cause you to have increased pain or swelling, decrease what you are doing until you are comfortable again and then slowly increase them. If you have problems or questions, call your caregiver or physical therapist for  advice.   Rehabilitation is important following a joint replacement. After just a few days of immobilization, the muscles of the leg can become weakened and shrink (atrophy).  These exercises are designed to build up the tone and strength of the thigh and leg muscles and to improve motion. Often times heat used for twenty to thirty minutes before working out will loosen up your tissues and help with improving the range of motion but do not use heat for the first two weeks following surgery (sometimes heat can increase post-operative swelling).   These exercises can be done on a training (exercise) mat, on the floor, on a table or on a bed. Use whatever works the best and is most comfortable for you.    Use music or television while you are exercising so that the exercises are a pleasant break in your day. This will make your life better with the exercises acting as a break in your routine that you can look forward to.   Perform all exercises about fifteen times, three times per day or as directed.  You should exercise both the operative leg and the other leg as well.  Exercises include:   Quad Sets - Tighten up the muscle on the front of the thigh (Quad) and hold for 5-10 seconds.   Straight Leg Raises - With your knee straight (if you were given a brace, keep it on), lift the leg to 60 degrees, hold for 3 seconds,  and slowly lower the leg.  Perform this exercise against resistance later as your leg gets stronger.  Leg Slides: Lying on your back, slowly slide your foot toward your buttocks, bending your knee up off the floor (only go as far as is comfortable). Then slowly slide your foot back down until your leg is flat on the floor again.  Angel Wings: Lying on your back spread your legs to the side as far apart as you can without causing discomfort.  Hamstring Strength:  Lying on your back, push your heel against the floor with your leg straight by tightening up the muscles of your buttocks.  Repeat,  but this time bend your knee to a comfortable angle, and push your heel against the floor.  You may put a pillow under the heel to make it more comfortable if necessary.   A rehabilitation program following joint replacement surgery can speed recovery and prevent re-injury in the future due to weakened muscles. Contact your doctor or a physical therapist for more information on knee rehabilitation.   CONSTIPATION:  Constipation is defined medically as fewer than three stools per week and severe constipation as less than one stool per week.  Even if you have a regular bowel pattern at home, your normal regimen is likely to be disrupted due to multiple reasons following surgery.  Combination of anesthesia, postoperative narcotics, change in appetite and fluid intake all can affect your bowels.   YOU MUST use at least one of the following options; they are listed in order of increasing strength to get the job done.  They are all available over the counter, and you may need to use some, POSSIBLY even all of these options:    Drink plenty of fluids (prune juice may be helpful) and high fiber foods Colace 100 mg by mouth twice a day  Senokot for constipation as directed and as needed Dulcolax (bisacodyl), take with full glass of water   Miralax  (polyethylene glycol) once or twice a day as needed.  If you have tried all these things and are unable to have a bowel movement in the first 3-4 days after surgery call either your surgeon or your primary doctor.    If you experience loose stools or diarrhea, hold the medications until you stool forms back up.  If your symptoms do not get better within 1 week or if they get worse, check with your doctor.  If you experience the worst abdominal pain ever or develop nausea or vomiting, please contact the office immediately for further recommendations for treatment.  ITCHING:  If you experience itching with your medications, try taking only a single pain pill, or  even half a pain pill at a time.  You can also use Benadryl  over the counter for itching or also to help with sleep.   TED HOSE STOCKINGS:  Use stockings on both legs until for at least 2 weeks or as directed by physician office. They may be removed at night for sleeping.  MEDICATIONS:  See your medication summary on the "After Visit Summary" that nursing will review with you.  You may have some home medications which will be placed on hold until you complete the course of blood thinner medication.  It is important for you to complete the blood thinner medication as prescribed.  Blood clot prevention (DVT Prophylaxis): After surgery you are at an increased risk for a blood clot. you were prescribed a blood thinner, Aspirin  81mg , to be taken twice daily for  a total of 4 weeks from surgery to help reduce your risk of getting a blood clot.  Signs of a pulmonary embolus (blood clot in the lungs) include sudden short of breath, feeling lightheaded or dizzy, chest pain with a deep breath, rapid pulse rapid breathing.  Signs of a blood clot in your arms or legs include new unexplained swelling and cramping, warm, red or darkened skin around the painful area.  Please call the office or 911 right away if these signs or symptoms develop.  PRECAUTIONS:   If you experience chest pain or shortness of breath - call 911 immediately for transfer to the hospital emergency department.   If you develop a fever greater that 101 F, purulent drainage from wound, increased redness or drainage from wound, foul odor from the wound/dressing, or calf pain - CONTACT YOUR SURGEON.                                                   FOLLOW-UP APPOINTMENTS:  If you do not already have a post-op appointment, please call the office for an appointment to be seen by your surgeon.  Guidelines for how soon to be seen are listed in your "After Visit Summary", but are typically between 2-3 weeks after surgery.  If you have a specialized  bandage, you may be told to follow up 1 week after surgery.  POST-OPERATIVE OPIOID TAPER INSTRUCTIONS: It is important to wean off of your opioid medication as soon as possible. If you do not need pain medication after your surgery it is ok to stop day one. Opioids include: Codeine, Hydrocodone (Norco, Vicodin), Oxycodone (Percocet, oxycontin ) and hydromorphone  amongst others.  Long term and even short term use of opiods can cause: Increased pain response Dependence Constipation Depression Respiratory depression And more.  Withdrawal symptoms can include Flu like symptoms Nausea, vomiting And more Techniques to manage these symptoms Hydrate well Eat regular healthy meals Stay active Use relaxation techniques(deep breathing, meditating, yoga) Do Not substitute Alcohol  to help with tapering If you have been on opioids for less than two weeks and do not have pain than it is ok to stop all together.  Plan to wean off of opioids This plan should start within one week post op of your joint replacement. Maintain the same interval or time between taking each dose and first decrease the dose.  Cut the total daily intake of opioids by one tablet each day Next start to increase the time between doses. The last dose that should be eliminated is the evening dose.   MAKE SURE YOU:  Understand these instructions.  Get help right away if you are not doing well or get worse.    Thank you for letting us  be a part of your medical care team.  It is a privilege we respect greatly.  We hope these instructions will help you stay on track for a fast and full recovery!            Signed: Ashleyann Shoun A Kanyia Heaslip 06/04/2023, 7:26 AM

## 2023-06-04 NOTE — TOC Transition Note (Signed)
 Transition of Care [facility]) - Discharge Note   Patient Details  Name: Ebony [name] MRN: [id number] Date of Birth: 10-24-1965  [facility] CM/SW Contact:  NORMAN ASPEN, LCSW Phone Number: 06/04/2023, 3:20 PM   Clinical Narrative:    Met with pt who confirms she has needed DME in the home and OPPT follow up already arranged.  No TOC needs.   Final next level of care: OP Rehab Barriers to Discharge: No Barriers Identified   Patient Goals and CMS Choice Patient states their goals for this hospitalization and ongoing recovery are:: return home          Discharge Placement                       Discharge Plan and Services Additional resources added to the After Visit Summary for                  DME Arranged: N/A DME Agency: NA                  Social Drivers of Health (SDOH) Interventions SDOH Screenings   Food Insecurity: No Food Insecurity (06/03/2023)  Housing: Low Risk  (06/03/2023)  Transportation Needs: No Transportation Needs (06/03/2023)  Utilities: Not At Risk (06/03/2023)  Alcohol  Screen: Low Risk  (12/26/2022)  Depression (PHQ2-9): Low Risk  (01/03/2023)  Financial Resource Strain: Low Risk  (12/26/2022)  Physical Activity: Unknown (12/26/2022)  Social Connections: Socially Integrated (06/03/2023)  Stress: No Stress Concern Present (12/26/2022)  Tobacco Use: Low Risk  (06/03/2023)     Readmission Risk Interventions    06/04/2023    3:20 PM  Readmission Risk Prevention Plan  Post Dischage Appt Complete  Medication Screening Complete  Transportation Screening Complete

## 2023-06-04 NOTE — Plan of Care (Signed)
  Problem: Activity: Goal: Risk for activity intolerance will decrease Outcome: Progressing   Problem: Safety: Goal: Ability to remain free from injury will improve Outcome: Progressing   Problem: Pain Management: Goal: General experience of comfort will improve Outcome: Progressing

## 2023-06-06 ENCOUNTER — Encounter (HOSPITAL_COMMUNITY): Payer: Self-pay | Admitting: Orthopedic Surgery

## 2023-06-08 LAB — AEROBIC/ANAEROBIC CULTURE W GRAM STAIN (SURGICAL/DEEP WOUND)
Culture: NO GROWTH
Culture: NO GROWTH
Culture: NO GROWTH
Gram Stain: NONE SEEN
Gram Stain: NONE SEEN
Gram Stain: NONE SEEN

## 2023-06-21 DIAGNOSIS — M1611 Unilateral primary osteoarthritis, right hip: Secondary | ICD-10-CM | POA: Diagnosis not present

## 2023-06-23 ENCOUNTER — Other Ambulatory Visit: Payer: Self-pay | Admitting: Family Medicine

## 2023-06-27 ENCOUNTER — Other Ambulatory Visit: Payer: Self-pay | Admitting: Gastroenterology

## 2023-06-27 DIAGNOSIS — K219 Gastro-esophageal reflux disease without esophagitis: Secondary | ICD-10-CM

## 2023-07-05 DIAGNOSIS — M1611 Unilateral primary osteoarthritis, right hip: Secondary | ICD-10-CM | POA: Diagnosis not present

## 2023-07-12 DIAGNOSIS — M1611 Unilateral primary osteoarthritis, right hip: Secondary | ICD-10-CM | POA: Diagnosis not present

## 2023-07-16 DIAGNOSIS — M1611 Unilateral primary osteoarthritis, right hip: Secondary | ICD-10-CM | POA: Diagnosis not present

## 2023-07-23 ENCOUNTER — Telehealth: Payer: Self-pay | Admitting: Family Medicine

## 2023-07-23 DIAGNOSIS — I10 Essential (primary) hypertension: Secondary | ICD-10-CM

## 2023-07-23 DIAGNOSIS — E119 Type 2 diabetes mellitus without complications: Secondary | ICD-10-CM

## 2023-07-23 DIAGNOSIS — E039 Hypothyroidism, unspecified: Secondary | ICD-10-CM

## 2023-07-23 DIAGNOSIS — E782 Mixed hyperlipidemia: Secondary | ICD-10-CM

## 2023-07-23 NOTE — Telephone Encounter (Signed)
Pt's appt had to be r/s due to weather. She r/s with Melissa but would like to know if she can get labs prior to her appt

## 2023-07-25 ENCOUNTER — Encounter: Payer: Managed Care, Other (non HMO) | Admitting: Family Medicine

## 2023-07-25 DIAGNOSIS — M1611 Unilateral primary osteoarthritis, right hip: Secondary | ICD-10-CM | POA: Diagnosis not present

## 2023-07-25 NOTE — Telephone Encounter (Signed)
Patient scheduled appointment for 08/05/23 @ 9:45 AM and labs orders were placed.

## 2023-07-25 NOTE — Addendum Note (Signed)
Addended by: Margo Common on: 07/25/2023 10:51 AM   Modules accepted: Orders

## 2023-08-01 DIAGNOSIS — M1611 Unilateral primary osteoarthritis, right hip: Secondary | ICD-10-CM | POA: Diagnosis not present

## 2023-08-05 ENCOUNTER — Other Ambulatory Visit (INDEPENDENT_AMBULATORY_CARE_PROVIDER_SITE_OTHER): Payer: 59

## 2023-08-05 ENCOUNTER — Encounter: Payer: Self-pay | Admitting: Family

## 2023-08-05 DIAGNOSIS — E782 Mixed hyperlipidemia: Secondary | ICD-10-CM | POA: Diagnosis not present

## 2023-08-05 DIAGNOSIS — I10 Essential (primary) hypertension: Secondary | ICD-10-CM | POA: Diagnosis not present

## 2023-08-05 DIAGNOSIS — E039 Hypothyroidism, unspecified: Secondary | ICD-10-CM

## 2023-08-05 DIAGNOSIS — E119 Type 2 diabetes mellitus without complications: Secondary | ICD-10-CM

## 2023-08-05 LAB — LIPID PANEL
Cholesterol: 194 mg/dL (ref 0–200)
HDL: 61.5 mg/dL (ref >39.00)
LDL Cholesterol: 115 mg/dL — ABNORMAL HIGH (ref 0–99)
NonHDL: 132.62
Total CHOL/HDL Ratio: 3
Triglycerides: 89 mg/dL (ref 0.0–149.0)
VLDL: 17.8 mg/dL (ref 0.0–40.0)

## 2023-08-05 LAB — CBC WITH DIFFERENTIAL/PLATELET
Basophils Absolute: 0.1 10*3/uL (ref 0.0–0.1)
Basophils Relative: 0.9 % (ref 0.0–3.0)
Eosinophils Absolute: 0.3 10*3/uL (ref 0.0–0.7)
Eosinophils Relative: 3.5 % (ref 0.0–5.0)
HCT: 40.8 % (ref 36.0–46.0)
Hemoglobin: 13.6 g/dL (ref 12.0–15.0)
Lymphocytes Relative: 31.7 % (ref 12.0–46.0)
Lymphs Abs: 2.3 10*3/uL (ref 0.7–4.0)
MCHC: 33.3 g/dL (ref 30.0–36.0)
MCV: 83.2 fl (ref 78.0–100.0)
Monocytes Absolute: 0.5 10*3/uL (ref 0.1–1.0)
Monocytes Relative: 6.7 % (ref 3.0–12.0)
Neutro Abs: 4.2 10*3/uL (ref 1.4–7.7)
Neutrophils Relative %: 57.2 % (ref 43.0–77.0)
Platelets: 291 10*3/uL (ref 150.0–400.0)
RBC: 4.91 Mil/uL (ref 3.87–5.11)
RDW: 14 % (ref 11.5–15.5)
WBC: 7.4 10*3/uL (ref 4.0–10.5)

## 2023-08-05 LAB — TSH: TSH: 1.77 u[IU]/mL (ref 0.35–5.50)

## 2023-08-05 LAB — COMPREHENSIVE METABOLIC PANEL
ALT: 11 U/L (ref 0–35)
AST: 17 U/L (ref 0–37)
Albumin: 4.5 g/dL (ref 3.5–5.2)
Alkaline Phosphatase: 80 U/L (ref 39–117)
BUN: 15 mg/dL (ref 6–23)
CO2: 26 meq/L (ref 19–32)
Calcium: 9.6 mg/dL (ref 8.4–10.5)
Chloride: 106 meq/L (ref 96–112)
Creatinine, Ser: 0.8 mg/dL (ref 0.40–1.20)
GFR: 82.89 mL/min (ref >60.00)
Glucose, Bld: 91 mg/dL (ref 70–99)
Potassium: 4.3 meq/L (ref 3.5–5.1)
Sodium: 141 meq/L (ref 135–145)
Total Bilirubin: 0.4 mg/dL (ref 0.2–1.2)
Total Protein: 7.5 g/dL (ref 6.0–8.3)

## 2023-08-05 LAB — HEMOGLOBIN A1C: Hgb A1c MFr Bld: 5.7 % (ref 4.6–6.5)

## 2023-08-05 LAB — MICROALBUMIN / CREATININE URINE RATIO
Creatinine,U: 180.9 mg/dL
Microalb Creat Ratio: 8.7 mg/g (ref 0.0–30.0)
Microalb, Ur: 1.6 mg/dL (ref 0.0–1.9)

## 2023-08-07 ENCOUNTER — Encounter: Payer: Self-pay | Admitting: Family

## 2023-08-07 ENCOUNTER — Ambulatory Visit (INDEPENDENT_AMBULATORY_CARE_PROVIDER_SITE_OTHER): Payer: Managed Care, Other (non HMO) | Admitting: Family

## 2023-08-07 VITALS — BP 126/87 | HR 84 | Temp 99.0°F | Resp 16 | Ht 65.0 in | Wt 205.0 lb

## 2023-08-07 DIAGNOSIS — F419 Anxiety disorder, unspecified: Secondary | ICD-10-CM | POA: Diagnosis not present

## 2023-08-07 DIAGNOSIS — Z0000 Encounter for general adult medical examination without abnormal findings: Secondary | ICD-10-CM

## 2023-08-07 MED ORDER — HYDROXYZINE PAMOATE 25 MG PO CAPS
ORAL_CAPSULE | ORAL | 1 refills | Status: DC
Start: 2023-08-07 — End: 2023-08-31

## 2023-08-07 NOTE — Progress Notes (Unsigned)
 Subjective:     Patient ID: Ebony [name], female    DOB: 07/03/1965, 58 y.o.   MRN: [id number]  Chief Complaint  Patient presents with   Annual Exam    HPI  Discussed the use of AI scribe software for clinical note transcription with the patient, who gave verbal consent to proceed.  History of Present Illness  Ebony [name] "Elonda Husky" is a 58 year old female who presents for an annual physical exam.  She has not received a flu shot this year and does not wish to have one. Her tetanus vaccination was updated a year ago, and she has not received the shingles vaccine despite being offered it previously.  She underwent a hip revision surgery on June 03, 2023, and was off her feet for six weeks, leading to weight gain due to decreased activity and food brought by others. She has recently been released to resume activities and has started walking but has not yet returned to the gym.  She experiences significant anxiety and stress due to her husband's recent job relocation to [place], which requires her to manage the household move and her son's transition to independent living. Her son, who has a history of brain tumors, is due for an MRI this Saturday. She has difficulty sleeping, waking up frequently, and feeling overwhelmed by the situation. She takes Effexor 75 mg daily for anxiety.  She has a family history of breast cancer and carries a BR1P genetic mutation. She alternates between mammograms and MRIs annually due to this history. Her GYN orders these studies for her. Her ovaries and fallopian tubes were removed after menopause due to the genetic mutation.  No current cough, cold symptoms, skin issues, hearing or vision concerns, leg swelling, digestive issues, urinary problems, unusual muscle or joint pain besides her hip, frequent headaches, or other concerns. Patient presents today for complete physical.  Diet: gained weight due to recent hip revision. Wt Readings from  Last 3 Encounters:  08/07/23 205 lb (93 kg)  06/03/23 188 lb (85.3 kg)  05/21/23 188 lb (85.3 kg)  Exercise: walking Colonoscopy: up to date Pap Smear: 5/23 Mammogram: due Vision: up to date Dental: up to date    Health Maintenance Due  Topic Date Due   HIV Screening  Never done   Hepatitis C Screening  Never done   Zoster Vaccines- Shingrix (1 of 2) Never done   Pneumococcal Vaccine 49-41 Years old (2 of 2 - PPSV23 or PCV20) 08/15/2015   FOOT EXAM  06/19/2016    Past Medical History:  Diagnosis Date   Arthritis    hips     BCC (basal cell carcinoma), face    skin    Concussion with no loss of consciousness 12/28/2014   Depression with anxiety 10/06/2012   Diabetes mellitus without complication (HCC)    no meds--- reports as borderline , last a1c 01-05-2019 with PCP 6.3%   GERD (gastroesophageal reflux disease) 09/05/2015   MGD WITH PPI    Headache(784.0)    otc meds prn, migraine, NOW 1-2 TIMES ANNUALLY    Hyperlipidemia, mixed 12/28/2014   NOT ON ANY MEDICATIONS UPDATED 12/04/21   Hypertension    only with preg. 43yrs ago 2002- no meds   Hypothyroid 06/15/2012   Obesity 09/05/2015   Post-operative nausea and vomiting    SVT (supraventricular tachycardia) (HCC)    Tinea pedis 12/28/2014    Past Surgical History:  Procedure Laterality Date   ABDOMINAL HYSTERECTOMY  03/04/2012  Procedure: HYSTERECTOMY ABDOMINAL;  Surgeon: Zelphia Cairo, MD;  Location: [facility];  Service: Gynecology;  Laterality: N/A;  with lysis of adhesions   BASAL CELL CARCINOMA EXCISION Left    cheek/face   CESAREAN SECTION  2002   HELLP Syndrome/ one time   COLONOSCOPY     SAME TIME AS ENDO    DILATION AND CURETTAGE OF UTERUS  2007   Miscarriage   FINGER SURGERY     2023 x 2 (ORIF and removal)   HAND SURGERY Right    RING FINGER BROKEN 09/2021   LAPAROSCOPIC LYSIS OF ADHESIONS N/A 02/17/2019   Procedure: LAPAROSCOPIC LYSIS OF ADHESIONS;  Surgeon: Adolphus Birchwood, MD;  Location: [facility];   Service: Gynecology;  Laterality: N/A;   LAPAROSCOPIC TUBAL LIGATION  01/03/2011   Procedure: LAPAROSCOPIC TUBAL LIGATION;  Surgeon: Zelphia Cairo;  Location: [facility];  Service: Gynecology;  Laterality: Bilateral;  with filshie clips   LAPAROSCOPY  03/04/2012   Procedure: LAPAROSCOPY DIAGNOSTIC;  Surgeon: Zelphia Cairo, MD;  Location: [facility];  Service: Gynecology;  Laterality: N/A;  with lysis of adhesions   POLYPECTOMY     ROBOTIC ASSISTED BILATERAL SALPINGO OOPHERECTOMY Bilateral 02/17/2019   Procedure: XI ROBOTIC ASSISTED BILATERAL SALPINGO OOPHORECTOMY;  Surgeon: Adolphus Birchwood, MD;  Location: [facility];  Service: Gynecology;  Laterality: Bilateral;   SHOULDER ARTHROSCOPY  2012   right   SVT ABLATION N/A 12/17/2018   Procedure: SVT ABLATION;  Surgeon: Marinus Maw, MD;  Location: [facility];  Service: Cardiovascular;  Laterality: N/A;   TONSILLECTOMY     TOTAL HIP ARTHROPLASTY Right    FEB 2021   TOTAL HIP REVISION Right 06/03/2023   Procedure: TOTAL HIP REVISION;  Surgeon: Joen Laura, MD;  Location: [facility];  Service: Orthopedics;  Laterality: Right;   UPPER GI ENDOSCOPY      Family History  Problem Relation Age of Onset   Hypertension Mother    Depression Mother    Breast cancer Mother 1   Lung cancer Mother        lung (06-13-11)   Basal cell carcinoma Brother 86   Other Maternal Grandmother        spinal stenosis   Emphysema Maternal Grandfather    Prostate cancer Maternal Grandfather    Breast cancer Cousin 40   Colon cancer Neg Hx    Esophageal cancer Neg Hx    Pancreatic cancer Neg Hx    Stomach cancer Neg Hx    Colon polyps Neg Hx    Crohn's disease Neg Hx    Rectal cancer Neg Hx     Social History   Socioeconomic History   Marital status: Married    Spouse name: Not on file   Number of children: 1   Years of education: Not on file   Highest education level: Some college, no degree  Occupational History    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  Tobacco  Use   Smoking status: Never    Passive exposure: Past (BOTH PARENTS SMOKED)   Smokeless tobacco: Never  Vaping Use   Vaping status: Never Used  Substance and Sexual Activity   Alcohol use: Yes    Comment: occasionally    Drug use: No   Sexual activity: Yes    Partners: Male    Birth control/protection: Surgical  Other Topics Concern   Not on file  Social History Narrative   Works doing Designer, 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   Married   One grown son  Enjoys music, reading, spending time with friends   Social Drivers of Health   Financial Resource Strain: Low Risk  (08/04/2023)   Overall Financial Resource Strain (CARDIA)    Difficulty of Paying Living Expenses: Not very hard  Food Insecurity: No Food Insecurity (08/04/2023)   Hunger Vital Sign    Worried About Running Out of Food in the Last Year: Never true    Ran Out of Food in the Last Year: Never true  Transportation Needs: No Transportation Needs (08/04/2023)   PRAPARE - Administrator, Civil Service (Medical): No    Lack of Transportation (Non-Medical): No  Physical Activity: Insufficiently Active (08/04/2023)   Exercise Vital Sign    Days of Exercise per Week: 1 day    Minutes of Exercise per Session: 20 min  Stress: Stress Concern Present (08/04/2023)   [employer] of Occupational Health - Occupational Stress Questionnaire    Feeling of Stress : Very much  Social Connections: Socially Integrated (08/04/2023)   Social Connection and Isolation Panel [NHANES]    Frequency of Communication with Friends and Family: More than three times a week    Frequency of Social Gatherings with Friends and Family: Three times a week    Attends Religious Services: More than 4 times per year    Active Member of Clubs or Organizations: Yes    Attends Banker Meetings: More than 4 times per year    Marital Status: Married  Catering manager Violence: Not At Risk (06/03/2023)   Humiliation, Afraid, Rape, and Kick  questionnaire    Fear of Current or Ex-Partner: No    Emotionally Abused: No    Physically Abused: No    Sexually Abused: No    Outpatient Medications Prior to Visit  Medication Sig Dispense Refill   Ascorbic Acid (VITAMIN C) 1000 MG tablet Take 1,000 mg by mouth every evening.     Levomefolate Glucosamine (METHYLFOLATE PO) Take 15 mg by mouth daily.     levothyroxine (SYNTHROID) 75 MCG tablet TAKE 1 TABLET BY MOUTH EVERY DAY BEFORE BREAKFAST 90 tablet 3   pantoprazole (PROTONIX) 40 MG tablet TAKE 1 TABLET BY MOUTH TWICE A DAY 180 tablet 0   polyethylene glycol (MIRALAX) 17 g packet Take 17 g by mouth daily. 14 each 0   SUMAtriptan (IMITREX) 50 MG tablet Take 1 tablet (50 mg total) by mouth every 2 (two) hours as needed for migraine or headache (max of 2 tabs in 24 hours). May repeat in 2 hours if headache persists or recurs. 10 tablet 0   tirzepatide (MOUNJARO) 7.5 MG/0.5ML Pen Inject 7.5 mg into the skin once a week. 6 mL    venlafaxine XR (EFFEXOR-XR) 75 MG 24 hr capsule TAKE 1 CAPSULE BY MOUTH DAILY WITH BREAKFAST. 30 capsule 5   No facility-administered medications prior to visit.    Allergies  Allergen Reactions   Sulfamethoxazole-Trimethoprim Nausea And Vomiting and Other (See Comments)    headache    Review of Systems  Constitutional:  Negative for weight loss.  HENT:  Negative for congestion and hearing loss.   Eyes:  Negative for blurred vision.  Respiratory:  Negative for cough.   Cardiovascular:  Negative for leg swelling.  Gastrointestinal:  Negative for constipation and diarrhea.  Genitourinary:  Negative for dysuria, frequency and hematuria.  Musculoskeletal:  Negative for joint pain and myalgias.  Skin:  Negative for rash.  Neurological:  Negative for headaches.       Objective:  Physical Exam   BP 126/87 (BP Location: Right Arm, Patient Position: Sitting, Cuff Size: Large)   Pulse 84   Temp 99 F (37.2 C) (Oral)   Resp 16   Ht 5\' 5"  (1.651 m)    Wt 205 lb (93 kg)   LMP 12/15/2010   SpO2 97%   BMI 34.11 kg/m  Wt Readings from Last 3 Encounters:  08/07/23 205 lb (93 kg)  06/03/23 188 lb (85.3 kg)  05/21/23 188 lb (85.3 kg)  Physical Exam  Constitutional: She is oriented to person, place, and time. She appears well-developed and well-nourished. No distress.  HENT:  Head: Normocephalic and atraumatic.  Right Ear: Tympanic membrane and ear canal normal.  Left Ear: Tympanic membrane and ear canal normal.  Mouth/Throat: Oropharynx is clear and moist.  Eyes: Pupils are equal, round, and reactive to light. No scleral icterus.  Neck: Normal range of motion. No thyromegaly present.  Cardiovascular: Normal rate and regular rhythm.   No murmur heard. Pulmonary/Chest: Effort normal and breath sounds normal. No respiratory distress. He has no wheezes. She has no rales. She exhibits no tenderness.  Abdominal: Soft. Bowel sounds are normal. She exhibits no distension and no mass. There is no tenderness. There is no rebound and no guarding.  Musculoskeletal: She exhibits no edema.  Lymphadenopathy:    She has no cervical adenopathy.  Neurological: She is alert and oriented to person, place, and time. She has normal patellar reflexes. She exhibits normal muscle tone. Coordination normal.  Skin: Skin is warm and dry.  Psychiatric: She has a normal mood and affect. Her behavior is normal. Judgment and thought content normal.  Breast/pelvic: deferred        Assessment & Plan:        Assessment & Plan:   Problem List Items Addressed This Visit       Unprioritized   Preventative health care - Primary    Up to date with screenings and vaccinations except flu and shingles vaccines. Managing diet and exercise post-hip revision. - Recommend scheduling shingles vaccine at a later date, either at a nurse visit or local pharmacy. - Encourage continuation of healthy diet and gradual increase in physical activity post-hip revision. - Ensure  follow-up with GYN for breast imaging schedule due to family history and genetic mutation. - Confirm vision and dental check-ups are up to date. - Schedule future lab appointment for six months.      Anxiety    Situational anxiety due to stressors. Effexor 75 mg daily. Hydroxyzine discussed for anxiety and sleep. Klonopin considered if hydroxyzine ineffective. - Prescribe hydroxyzine, one tablet twice daily as needed for anxiety or sleep. - Advise taking the first dose at home to assess drowsiness. - Discuss Klonopin for short-term use if hydroxyzine is ineffective, requiring controlled substance contract.      Relevant Medications   hydrOXYzine (VISTARIL) 25 MG capsule    I am having Marline Backbone. Zacher "Kathie" start on hydrOXYzine. I am also having her maintain her vitamin C, tirzepatide, SUMAtriptan, venlafaxine XR, Levomefolate Glucosamine (METHYLFOLATE PO), polyethylene glycol, levothyroxine, and pantoprazole.  Meds ordered this encounter  Medications   hydrOXYzine (VISTARIL) 25 MG capsule    Sig: Take 1 tablet twice daily as needed for anxiety/insomnia    Dispense:  60 capsule    Refill:  1    Supervising Provider:   Danise Edge A [[id number]]

## 2023-08-08 DIAGNOSIS — F419 Anxiety disorder, unspecified: Secondary | ICD-10-CM | POA: Insufficient documentation

## 2023-08-08 NOTE — Assessment & Plan Note (Signed)
  Up to date with screenings and vaccinations except flu and shingles vaccines. Managing diet and exercise post-hip revision. - Recommend scheduling shingles vaccine at a later date, either at a nurse visit or local pharmacy. - Encourage continuation of healthy diet and gradual increase in physical activity post-hip revision. - Ensure follow-up with GYN for breast imaging schedule due to family history and genetic mutation. - Confirm vision and dental check-ups are up to date. - Schedule future lab appointment for six months.

## 2023-08-08 NOTE — Assessment & Plan Note (Signed)
  Situational anxiety due to stressors. Effexor 75 mg daily. Hydroxyzine discussed for anxiety and sleep. Klonopin considered if hydroxyzine ineffective. - Prescribe hydroxyzine, one tablet twice daily as needed for anxiety or sleep. - Advise taking the first dose at home to assess drowsiness. - Discuss Klonopin for short-term use if hydroxyzine is ineffective, requiring controlled substance contract.

## 2023-08-08 NOTE — Patient Instructions (Signed)
 VISIT SUMMARY:  Ebony [name], a 58 year old female, came in for her annual physical exam. She discussed her recent hip revision surgery, current anxiety and stress due to her husband's job relocation, and her son's upcoming MRI. She also reviewed her vaccination status and family history of breast cancer.  YOUR PLAN:  -ANXIETY: Anxiety is a feeling of worry or fear that can be caused by stressful situations. You are currently taking Effexor 75 mg daily. We discussed adding hydroxyzine to help with anxiety and sleep, and you should take one tablet twice daily as needed. Please take the first dose at home to see if it makes you drowsy. If hydroxyzine does not help, we can consider Klonopin for short-term use, but this will require a controlled substance contract.  -GENERAL HEALTH MAINTENANCE: You are up to date with most of your screenings and vaccinations, except for the flu and shingles vaccines. We recommend scheduling your shingles vaccine at a later date, either at a nurse visit or local pharmacy. Continue with a healthy diet and gradually increase your physical activity now that you are recovering from your hip surgery. Make sure to follow up with your gynecologist for your breast imaging schedule due to your family history and genetic mutation. Also, confirm that your vision and dental check-ups are current. We will schedule a lab appointment for you in six months.  INSTRUCTIONS:  Please follow up with your gynecologist for your breast imaging schedule and ensure your vision and dental check-ups are up to date. We will schedule a lab appointment for you in six months.  For more information, you can read your full clinical note, available in your patient portal.

## 2023-08-15 DIAGNOSIS — M1611 Unilateral primary osteoarthritis, right hip: Secondary | ICD-10-CM | POA: Diagnosis not present

## 2023-08-30 ENCOUNTER — Other Ambulatory Visit: Payer: Self-pay | Admitting: Family

## 2023-09-23 DIAGNOSIS — C44519 Basal cell carcinoma of skin of other part of trunk: Secondary | ICD-10-CM | POA: Diagnosis not present

## 2023-09-23 DIAGNOSIS — D485 Neoplasm of uncertain behavior of skin: Secondary | ICD-10-CM | POA: Diagnosis not present

## 2023-10-07 ENCOUNTER — Other Ambulatory Visit: Payer: Self-pay | Admitting: Gastroenterology

## 2023-10-07 DIAGNOSIS — K219 Gastro-esophageal reflux disease without esophagitis: Secondary | ICD-10-CM

## 2023-10-08 DIAGNOSIS — M545 Low back pain, unspecified: Secondary | ICD-10-CM | POA: Diagnosis not present

## 2023-10-23 DIAGNOSIS — L820 Inflamed seborrheic keratosis: Secondary | ICD-10-CM | POA: Diagnosis not present

## 2023-10-23 DIAGNOSIS — C44519 Basal cell carcinoma of skin of other part of trunk: Secondary | ICD-10-CM | POA: Diagnosis not present

## 2023-10-30 DIAGNOSIS — L570 Actinic keratosis: Secondary | ICD-10-CM | POA: Diagnosis not present

## 2023-10-30 DIAGNOSIS — L738 Other specified follicular disorders: Secondary | ICD-10-CM | POA: Diagnosis not present

## 2023-10-30 DIAGNOSIS — L720 Epidermal cyst: Secondary | ICD-10-CM | POA: Diagnosis not present

## 2023-10-30 DIAGNOSIS — D225 Melanocytic nevi of trunk: Secondary | ICD-10-CM | POA: Diagnosis not present

## 2023-10-30 DIAGNOSIS — Z859 Personal history of malignant neoplasm, unspecified: Secondary | ICD-10-CM | POA: Diagnosis not present

## 2023-10-30 DIAGNOSIS — D485 Neoplasm of uncertain behavior of skin: Secondary | ICD-10-CM | POA: Diagnosis not present

## 2023-11-04 ENCOUNTER — Other Ambulatory Visit: Payer: Self-pay | Admitting: Family Medicine

## 2024-01-09 ENCOUNTER — Other Ambulatory Visit: Payer: Self-pay | Admitting: Gastroenterology

## 2024-01-09 DIAGNOSIS — K219 Gastro-esophageal reflux disease without esophagitis: Secondary | ICD-10-CM

## 2024-02-10 NOTE — Assessment & Plan Note (Deleted)
 On Levothyroxine, continue to monitor

## 2024-02-10 NOTE — Assessment & Plan Note (Deleted)
Encouraged DASH or MIND diet, decrease po intake and increase exercise as tolerated. Needs 7-8 hours of sleep nightly. Avoid trans fats, eat small, frequent meals every 4-5 hours with lean proteins, complex carbs and healthy fats. Minimize simple carbs, high fat foods and processed foods. Is tolerating Mounjaro and having good success with weight loss.

## 2024-02-10 NOTE — Assessment & Plan Note (Deleted)
 hgba1c acceptable, minimize simple carbs. Increase exercise as tolerated. Continue current meds

## 2024-02-10 NOTE — Assessment & Plan Note (Deleted)
 Tolerating statin, encouraged heart healthy diet, avoid trans fats, minimize simple carbs and saturated fats. Increase exercise as tolerated

## 2024-02-12 ENCOUNTER — Other Ambulatory Visit: Payer: Self-pay | Admitting: Gastroenterology

## 2024-02-12 DIAGNOSIS — K219 Gastro-esophageal reflux disease without esophagitis: Secondary | ICD-10-CM

## 2024-02-13 ENCOUNTER — Ambulatory Visit: Admitting: Family Medicine

## 2024-02-14 ENCOUNTER — Other Ambulatory Visit: Payer: Self-pay | Admitting: Gastroenterology

## 2024-02-14 DIAGNOSIS — K219 Gastro-esophageal reflux disease without esophagitis: Secondary | ICD-10-CM

## 2024-02-24 NOTE — Assessment & Plan Note (Signed)
 Counseled on lifestyle modifications including avoiding trigger foods (spicy, fatty, acidic), eating smaller meals, not lying down within 2-3 hours of eating, elevating head of bed, and weight management if applicable.

## 2024-02-24 NOTE — Assessment & Plan Note (Signed)
 hgba1c acceptable, minimize simple carbs. Increase exercise as tolerated. Continue current meds

## 2024-02-24 NOTE — Assessment & Plan Note (Addendum)
 Stable on levothyroxine .  Continue to monitor. Labs updated and reviewed.

## 2024-02-24 NOTE — Progress Notes (Unsigned)
 Subjective:     Patient ID: Ebony [name], female    DOB: 1965/11/16, 58 y.o.   MRN: [id number]  No chief complaint on file.   HPI  Discussed the use of AI scribe software for clinical note transcription with the patient, who gave verbal consent to proceed.  Ebony [name] is a 58 year old female presents for follow-up chronic conditions  Follows with orthopedics, DERM, GYN  Anxiety Effexor  75 mg daily; Hydroxyzine  prn ***HOW has this been working? She experiences significant anxiety and stress due to her husband's recent job relocation to [place] , which requires her to manage the household move and her son's transition to independent living. Her son, who has a history of brain tumors. She has difficulty sleeping, waking up frequently, and feeling overwhelmed by the situation. She takes Effexor  75 mg daily for anxiety.   Hypothyroidism-Synthroid  75 mcg daily  GERD with esophagitis- Following with GI???? WHY NOT? EGD in 11/2018 with LA Grade A esophagitis  Protonix  40 mg.. once or twice per day???***   Reports taking medications as prescribed, denies adverse side effects  Patient denies fever, chills, SOB, CP, palpitations, dyspnea, edema, HA, vision changes, N/V/D, abdominal pain, urinary symptoms, rash, weight changes, and recent illness or hospitalizations.      History of Present Illness              Health Maintenance Due  Topic Date Due   HIV Screening  Never done   Hepatitis C Screening  Never done   Hepatitis B Vaccines 19-59 Average Risk (1 of 3 - 19+ 3-dose series) Never done   Zoster Vaccines- Shingrix (1 of 2) Never done   Pneumococcal Vaccine: 50+ Years (2 of 2 - PPSV23, PCV20, or PCV21) 08/15/2015   FOOT EXAM  06/19/2016   Influenza Vaccine  01/03/2024   HEMOGLOBIN A1C  02/05/2024    Past Medical History:  Diagnosis Date   Arthritis    hips     BCC (basal cell carcinoma), face    skin    Concussion with no loss of consciousness  12/28/2014   Depression with anxiety 10/06/2012   Diabetes mellitus without complication (HCC)    no meds--- reports as borderline , last a1c 01-05-2019 with PCP 6.3%   GERD (gastroesophageal reflux disease) 09/05/2015   MGD WITH PPI    Headache(784.0)    otc meds prn, migraine, NOW 1-2 TIMES ANNUALLY    Hyperlipidemia, mixed 12/28/2014   NOT ON ANY MEDICATIONS UPDATED 12/04/21   Hypertension    only with preg. 64yrs ago 2002- no meds   Hypothyroid 06/15/2012   Obesity 09/05/2015   Post-operative nausea and vomiting    SVT (supraventricular tachycardia)    Tinea pedis 12/28/2014    Past Surgical History:  Procedure Laterality Date   ABDOMINAL HYSTERECTOMY  03/04/2012   Procedure: HYSTERECTOMY ABDOMINAL;  Surgeon: Truman Corona, MD;  Location: [facility];  Service: Gynecology;  Laterality: N/A;  with lysis of adhesions   BASAL CELL CARCINOMA EXCISION Left    cheek/face   CESAREAN SECTION  2002   HELLP Syndrome/ one time   COLONOSCOPY     SAME TIME AS ENDO    DILATION AND CURETTAGE OF UTERUS  2007   Miscarriage   FINGER SURGERY     2023 x 2 (ORIF and removal)   HAND SURGERY Right    RING FINGER BROKEN 09/2021   LAPAROSCOPIC LYSIS OF ADHESIONS N/A 02/17/2019   Procedure: LAPAROSCOPIC LYSIS OF ADHESIONS;  Surgeon: Eloy Herring,  MD;  Location: [facility];  Service: Gynecology;  Laterality: N/A;   LAPAROSCOPIC TUBAL LIGATION  01/03/2011   Procedure: LAPAROSCOPIC TUBAL LIGATION;  Surgeon: Truman Corona;  Location: [facility];  Service: Gynecology;  Laterality: Bilateral;  with filshie clips   LAPAROSCOPY  03/04/2012   Procedure: LAPAROSCOPY DIAGNOSTIC;  Surgeon: Truman Corona, MD;  Location: [facility];  Service: Gynecology;  Laterality: N/A;  with lysis of adhesions   POLYPECTOMY     ROBOTIC ASSISTED BILATERAL SALPINGO OOPHERECTOMY Bilateral 02/17/2019   Procedure: XI ROBOTIC ASSISTED BILATERAL SALPINGO OOPHORECTOMY;  Surgeon: Eloy Herring, MD;  Location: [facility];  Service: Gynecology;   Laterality: Bilateral;   SHOULDER ARTHROSCOPY  2012   right   SVT ABLATION N/A 12/17/2018   Procedure: SVT ABLATION;  Surgeon: Waddell Danelle ORN, MD;  Location: [facility];  Service: Cardiovascular;  Laterality: N/A;   TONSILLECTOMY     TOTAL HIP ARTHROPLASTY Right    FEB 2021   TOTAL HIP REVISION Right 06/03/2023   Procedure: TOTAL HIP REVISION;  Surgeon: Edna Toribio LABOR, MD;  Location: [facility];  Service: Orthopedics;  Laterality: Right;   UPPER GI ENDOSCOPY      Family History  Problem Relation Age of Onset   Hypertension Mother    Depression Mother    Breast cancer Mother 55   Lung cancer Mother        lung (06-13-11)   Basal cell carcinoma Brother 28   Other Maternal Grandmother        spinal stenosis   Emphysema Maternal Grandfather    Prostate cancer Maternal Grandfather    Breast cancer Cousin 40   Colon cancer Neg Hx    Esophageal cancer Neg Hx    Pancreatic cancer Neg Hx    Stomach cancer Neg Hx    Colon polyps Neg Hx    Crohn's disease Neg Hx    Rectal cancer Neg Hx     Social History   Socioeconomic History   Marital status: Married    Spouse name: Not on file   Number of children: 1   Years of education: Not on file   Highest education level: Some college, no degree  Occupational History    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  Tobacco Use   Smoking status: Never    Passive exposure: Past (BOTH PARENTS SMOKED)   Smokeless tobacco: Never  Vaping Use   Vaping status: Never Used  Substance and Sexual Activity   Alcohol  use: Yes    Comment: occasionally    Drug use: No   Sexual activity: Yes    Partners: Male    Birth control/protection: Surgical  Other Topics Concern   Not on file  Social History Narrative   Works doing Designer, 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   Married   One grown son   Enjoys music, reading, spending time with friends   Social Drivers of Corporate investment banker Strain: Low Risk  (02/20/2024)   Overall Financial Resource Strain  (CARDIA)    Difficulty of Paying Living Expenses: Not hard at all  Food Insecurity: No Food Insecurity (02/20/2024)   Hunger Vital Sign    Worried About Running Out of Food in the Last Year: Never true    Ran Out of Food in the Last Year: Never true  Transportation Needs: No Transportation Needs (02/20/2024)   PRAPARE - Administrator, Civil Service (Medical): No    Lack of Transportation (Non-Medical): No  Physical Activity: Sufficiently Active (  02/20/2024)   Exercise Vital Sign    Days of Exercise per Week: 6 days    Minutes of Exercise per Session: 30 min  Stress: Stress Concern Present (02/20/2024)   [employer] of Occupational Health - Occupational Stress Questionnaire    Feeling of Stress: Rather much  Social Connections: Socially Integrated (02/20/2024)   Social Connection and Isolation Panel    Frequency of Communication with Friends and Family: More than three times a week    Frequency of Social Gatherings with Friends and Family: Never    Attends Religious Services: More than 4 times per year    Active Member of [employer] or Organizations: Yes    Attends Engineer, structural: More than 4 times per year    Marital Status: Married  Catering manager Violence: Not At Risk (06/03/2023)   Humiliation, Afraid, Rape, and Kick questionnaire    Fear of Current or Ex-Partner: No    Emotionally Abused: No    Physically Abused: No    Sexually Abused: No    Outpatient Medications Prior to Visit  Medication Sig Dispense Refill   Ascorbic Acid (VITAMIN C) 1000 MG tablet Take 1,000 mg by mouth every evening.     hydrOXYzine  (VISTARIL ) 25 MG capsule TAKE 1 TABLET TWICE DAILY AS NEEDED FOR ANXIETY/INSOMNIA 180 capsule 1   Levomefolate Glucosamine (METHYLFOLATE PO) Take 15 mg by mouth daily.     levothyroxine  (SYNTHROID ) 75 MCG tablet TAKE 1 TABLET BY MOUTH EVERY DAY BEFORE BREAKFAST 90 tablet 3   pantoprazole  (PROTONIX ) 40 MG tablet TAKE 1 TABLET BY MOUTH TWICE A DAY  180 tablet 0   polyethylene glycol (MIRALAX ) 17 g packet Take 17 g by mouth daily. 14 each 0   SUMAtriptan  (IMITREX ) 50 MG tablet Take 1 tablet (50 mg total) by mouth every 2 (two) hours as needed for migraine or headache (max of 2 tabs in 24 hours). May repeat in 2 hours if headache persists or recurs. 10 tablet 0   tirzepatide  (MOUNJARO ) 7.5 MG/0.5ML Pen Inject 7.5 mg into the skin once a week. 6 mL    venlafaxine  XR (EFFEXOR -XR) 75 MG 24 hr capsule TAKE 1 CAPSULE BY MOUTH DAILY WITH BREAKFAST. 30 capsule 5   No facility-administered medications prior to visit.    Allergies  Allergen Reactions   Sulfamethoxazole-Trimethoprim Nausea And Vomiting and Other (See Comments)    headache    ROS See HPI    Objective:    Physical Exam   Physical Exam Constitutional:      General: She is not in acute distress.    Appearance: Normal appearance. She is well-developed. She is not toxic-appearing.  HENT:     Head: Normocephalic and atraumatic.     Right Ear: External ear normal. TM-WNL    Left Ear: External ear normal. TM-WNL    Nose: Nose normal. No drainage.  Eyes:     General:        Right eye: No discharge.        Left eye: No discharge.     Conjunctiva/sclera: Conjunctivae normal.  Neck:     Thyroid : No thyromegaly.  Cardiovascular:     Rate and Rhythm: Normal rate and regular rhythm.     Heart sounds: Normal heart sounds. No murmur heard. Pulmonary:     Effort: Pulmonary effort is normal. No respiratory distress.     Breath sounds: Normal breath sounds. CTAB.  Abdominal:     Bowel sounds are normal, Abdomen soft. No abdominal  tenderness.  Musculoskeletal:        General: Normal range of motion.     Cervical back: Neck supple.  Lymphadenopathy:     Cervical: No cervical adenopathy.  Skin:    General: Skin is warm and dry.  Neurological:     Mental Status: She is alert and oriented to person, place, and time.  Psychiatric:        Mood and Affect: Mood normal.         Behavior: Behavior normal.        Thought Content: Thought content normal.        Judgment: Judgment normal.    LMP 12/15/2010  Wt Readings from Last 3 Encounters:  08/07/23 205 lb (93 kg)  06/03/23 188 lb (85.3 kg)  05/21/23 188 lb (85.3 kg)       Assessment & Plan:   Problem List Items Addressed This Visit     Anxiety - Primary   Effexor  75 mg daily. Hydroxyzine  prn for anxiety and sleep.  Stable, denies SI/HI.      Essential hypertension   Well controlled, no changes to meds. Encouraged heart healthy diet such as the DASH diet and exercise as tolerated.         GERD (gastroesophageal reflux disease)    Counseled on lifestyle modifications including avoiding trigger foods (spicy, fatty, acidic), eating smaller meals, not lying down within 2-3 hours of eating, elevating head of bed, and weight management if applicable.       Hyperlipidemia, mixed   Encouraged heart healthy diet, avoid trans fats, minimize simple carbs and saturated fats. Increase exercise as tolerated  The 10-year ASCVD risk score (Arnett DK, et al., 2019) is: 3.6%   Values used to calculate the score:     Age: 80 years     Clincally relevant sex: Female     Is Non-Hispanic African American: No     Diabetic: Yes     Tobacco smoker: No     Systolic Blood Pressure: 126 mmHg     Is BP treated: No     HDL Cholesterol: 61.5 mg/dL     Total Cholesterol: 194 mg/dL       Hypothyroid   Stable on levothyroxine .  Continue to monitor. Labs updated and reviewed.      Type 2 diabetes mellitus without complication, without long-term current use of insulin (HCC)   hgba1c acceptable, minimize simple carbs. Increase exercise as tolerated. Continue current meds         HCM: -Mammogram: Follows with GYN- has BR1P genetic mutation, FH Breast Ca; she alternates between mammograms and MRIs annually due to history -Pap: last 10/2021; follows with GYN- [facility] - Colonoscopy: NEST DUE  01/2027 - Immunizations: FLU, PNA, Shingles- due  I am having Ebony [name] maintain her vitamin C, tirzepatide , SUMAtriptan , Levomefolate Glucosamine (METHYLFOLATE PO), polyethylene glycol, levothyroxine , pantoprazole , hydrOXYzine , and venlafaxine  XR.  No orders of the defined types were placed in this encounter.

## 2024-02-24 NOTE — Assessment & Plan Note (Signed)
 Well controlled, no changes to meds. Encouraged heart healthy diet such as the DASH diet and exercise as tolerated.

## 2024-02-24 NOTE — Assessment & Plan Note (Signed)
 Effexor  75 mg daily. Hydroxyzine  prn for anxiety and sleep.  Stable, denies SI/HI.

## 2024-02-24 NOTE — Assessment & Plan Note (Addendum)
 Encouraged heart healthy diet, avoid trans fats, minimize simple carbs and saturated fats. Increase exercise as tolerated  The 10-year ASCVD risk score (Arnett DK, et al., 2019) is: 3.6%   Values used to calculate the score:     Age: 58 years     Clincally relevant sex: Female     Is Non-Hispanic African American: No     Diabetic: Yes     Tobacco smoker: No     Systolic Blood Pressure: 126 mmHg     Is BP treated: No     HDL Cholesterol: 61.5 mg/dL     Total Cholesterol: 194 mg/dL

## 2024-02-26 ENCOUNTER — Encounter: Payer: Self-pay | Admitting: Student

## 2024-02-26 ENCOUNTER — Ambulatory Visit: Admitting: Student

## 2024-02-26 VITALS — BP 138/80 | HR 78 | Temp 98.6°F | Ht 65.0 in | Wt 217.6 lb

## 2024-02-26 DIAGNOSIS — E119 Type 2 diabetes mellitus without complications: Secondary | ICD-10-CM | POA: Diagnosis not present

## 2024-02-26 DIAGNOSIS — F419 Anxiety disorder, unspecified: Secondary | ICD-10-CM | POA: Diagnosis not present

## 2024-02-26 DIAGNOSIS — K2100 Gastro-esophageal reflux disease with esophagitis, without bleeding: Secondary | ICD-10-CM

## 2024-02-26 DIAGNOSIS — I10 Essential (primary) hypertension: Secondary | ICD-10-CM | POA: Diagnosis not present

## 2024-02-26 DIAGNOSIS — E039 Hypothyroidism, unspecified: Secondary | ICD-10-CM

## 2024-02-26 DIAGNOSIS — Z780 Asymptomatic menopausal state: Secondary | ICD-10-CM | POA: Diagnosis not present

## 2024-02-26 DIAGNOSIS — Z23 Encounter for immunization: Secondary | ICD-10-CM

## 2024-02-26 DIAGNOSIS — E782 Mixed hyperlipidemia: Secondary | ICD-10-CM

## 2024-02-26 DIAGNOSIS — F418 Other specified anxiety disorders: Secondary | ICD-10-CM

## 2024-02-26 DIAGNOSIS — Z0000 Encounter for general adult medical examination without abnormal findings: Secondary | ICD-10-CM | POA: Diagnosis not present

## 2024-02-26 MED ORDER — VENLAFAXINE HCL ER 75 MG PO CP24: 75.0000 mg | ORAL_CAPSULE | Freq: Every day | ORAL | 1 refills | Status: AC

## 2024-02-26 MED ORDER — HYDROXYZINE HCL 10 MG PO TABS: 10.0000 mg | ORAL_TABLET | Freq: Every day | ORAL | 1 refills | Status: AC

## 2024-02-26 MED ORDER — PANTOPRAZOLE SODIUM 20 MG PO TBEC
20.0000 mg | DELAYED_RELEASE_TABLET | Freq: Every day | ORAL | 1 refills | Status: AC
Start: 2024-02-26 — End: ?

## 2024-02-27 ENCOUNTER — Ambulatory Visit: Payer: Self-pay | Admitting: Student

## 2024-02-27 LAB — CBC WITH DIFFERENTIAL/PLATELET
Basophils Absolute: 0 K/uL (ref 0.0–0.1)
Basophils Relative: 0.7 % (ref 0.0–3.0)
Eosinophils Absolute: 0.2 K/uL (ref 0.0–0.7)
Eosinophils Relative: 2.7 % (ref 0.0–5.0)
HCT: 41.2 % (ref 36.0–46.0)
Hemoglobin: 13.8 g/dL (ref 12.0–15.0)
Lymphocytes Relative: 35.2 % (ref 12.0–46.0)
Lymphs Abs: 2.6 K/uL (ref 0.7–4.0)
MCHC: 33.6 g/dL (ref 30.0–36.0)
MCV: 83.4 fl (ref 78.0–100.0)
Monocytes Absolute: 0.5 K/uL (ref 0.1–1.0)
Monocytes Relative: 7 % (ref 3.0–12.0)
Neutro Abs: 4 K/uL (ref 1.4–7.7)
Neutrophils Relative %: 54.4 % (ref 43.0–77.0)
Platelets: 280 K/uL (ref 150.0–400.0)
RBC: 4.93 Mil/uL (ref 3.87–5.11)
RDW: 13 % (ref 11.5–15.5)
WBC: 7.4 K/uL (ref 4.0–10.5)

## 2024-02-27 LAB — COMPREHENSIVE METABOLIC PANEL WITH GFR
ALT: 19 U/L (ref 0–35)
AST: 24 U/L (ref 0–37)
Albumin: 4.5 g/dL (ref 3.5–5.2)
Alkaline Phosphatase: 71 U/L (ref 39–117)
BUN: 17 mg/dL (ref 6–23)
CO2: 27 meq/L (ref 19–32)
Calcium: 9.8 mg/dL (ref 8.4–10.5)
Chloride: 105 meq/L (ref 96–112)
Creatinine, Ser: 0.88 mg/dL (ref 0.40–1.20)
GFR: 73.64 mL/min (ref >60.00)
Glucose, Bld: 100 mg/dL — ABNORMAL HIGH (ref 70–99)
Potassium: 3.9 meq/L (ref 3.5–5.1)
Sodium: 142 meq/L (ref 135–145)
Total Bilirubin: 0.4 mg/dL (ref 0.2–1.2)
Total Protein: 7.2 g/dL (ref 6.0–8.3)

## 2024-02-27 LAB — VITAMIN D 25 HYDROXY (VIT D DEFICIENCY, FRACTURES): VITD: 52.31 ng/mL (ref 30.00–100.00)

## 2024-02-27 LAB — LIPID PANEL
Cholesterol: 189 mg/dL (ref 0–200)
HDL: 46.6 mg/dL (ref >39.00)
LDL Cholesterol: 112 mg/dL — ABNORMAL HIGH (ref 0–99)
NonHDL: 142.85
Total CHOL/HDL Ratio: 4
Triglycerides: 152 mg/dL — ABNORMAL HIGH (ref 0.0–149.0)
VLDL: 30.4 mg/dL (ref 0.0–40.0)

## 2024-02-27 LAB — HIV ANTIBODY (ROUTINE TESTING W REFLEX)
HIV 1&2 Ab, 4th Generation: NONREACTIVE
HIV FINAL INTERPRETATION: NEGATIVE

## 2024-02-27 LAB — TSH: TSH: 2.06 u[IU]/mL (ref 0.35–5.50)

## 2024-02-27 LAB — HEMOGLOBIN A1C: Hgb A1c MFr Bld: 6.7 % — ABNORMAL HIGH (ref 4.6–6.5)

## 2024-02-27 LAB — HEPATITIS C ANTIBODY: Hepatitis C Ab: NONREACTIVE

## 2024-05-07 ENCOUNTER — Ambulatory Visit: Admitting: Family Medicine

## 2024-05-07 ENCOUNTER — Encounter: Payer: Self-pay | Admitting: Family Medicine

## 2024-05-07 VITALS — BP 124/86 | Ht 65.0 in | Wt 205.0 lb

## 2024-05-07 DIAGNOSIS — M25552 Pain in left hip: Secondary | ICD-10-CM

## 2024-05-07 DIAGNOSIS — M25551 Pain in right hip: Secondary | ICD-10-CM

## 2024-05-07 DIAGNOSIS — Z96641 Presence of right artificial hip joint: Secondary | ICD-10-CM | POA: Diagnosis not present

## 2024-05-07 MED ORDER — METHYLPREDNISOLONE ACETATE 40 MG/ML IJ SUSP
40.0000 mg | Freq: Once | INTRAMUSCULAR | Status: AC
  Administered 2024-05-07: 40 mg via INTRA_ARTICULAR

## 2024-05-07 NOTE — Progress Notes (Signed)
 PCP: Domenica Harlene LABOR, MD  Patient is a 58 y.o. female with PMH of R hip replacement and later revision here for bilateral hip pain.  HPI  Patient presents with referral from Dr Venetia Ellen at [facility].  Patient is experiencing bilateral hip pain. Pain along lateral hips bilaterally (Lt>Rt) She reports significant discomfort, worst at night, which interferes with sleep. She is unable to lie down on either side due to the hip pain. Also has pain with walking, but to a lesser degree than lying down. Pain radiates to knees with aching quality. Per Dr Ellen, patient likely has bilateral trochanteric bursitis. Patient takes Advil  at night to help with sleep.  Patient denies weakness, numbness, tingling in feet. Not tripping or falling, no foot drop or drag. Patient notes that she had difficulty with lateral leg raise to resistance with Venetia yesterday due to pain.  Surgical/hip history: 2021 - R hip replacement with Dr Hiram 2022 - R hip dislocation 2024 - Hip revision with Dr Cristela  Past Medical History:  Diagnosis Date   Arthritis    hips     BCC (basal cell carcinoma), face    skin    Concussion with no loss of consciousness 12/28/2014   Depression with anxiety 10/06/2012   Diabetes mellitus without complication (HCC)    no meds--- reports as borderline , last a1c 01-05-2019 with PCP 6.3%   GERD (gastroesophageal reflux disease) 09/05/2015   MGD WITH PPI    Headache(784.0)    otc meds prn, migraine, NOW 1-2 TIMES ANNUALLY    Hyperlipidemia, mixed 12/28/2014   NOT ON ANY MEDICATIONS UPDATED 12/04/21   Hypertension    only with preg. 69yrs ago 2002- no meds   Hypothyroid 06/15/2012   Obesity 09/05/2015   Post-operative nausea and vomiting    SVT (supraventricular tachycardia)    Tinea pedis 12/28/2014    Current Outpatient Medications on File Prior to Visit  Medication Sig Dispense Refill   Ascorbic Acid (VITAMIN C) 1000 MG tablet Take  1,000 mg by mouth every evening.     hydrOXYzine  (ATARAX ) 10 MG tablet Take 1 tablet (10 mg total) by mouth at bedtime. 90 tablet 1   Levomefolate Glucosamine (METHYLFOLATE PO) Take 15 mg by mouth daily.     levothyroxine  (SYNTHROID ) 75 MCG tablet TAKE 1 TABLET BY MOUTH EVERY DAY BEFORE BREAKFAST 90 tablet 3   pantoprazole  (PROTONIX ) 20 MG tablet Take 1 tablet (20 mg total) by mouth daily. 90 tablet 1   polyethylene glycol (MIRALAX ) 17 g packet Take 17 g by mouth daily. 14 each 0   SUMAtriptan  (IMITREX ) 50 MG tablet Take 1 tablet (50 mg total) by mouth every 2 (two) hours as needed for migraine or headache (max of 2 tabs in 24 hours). May repeat in 2 hours if headache persists or recurs. 10 tablet 0   venlafaxine  XR (EFFEXOR -XR) 75 MG 24 hr capsule Take 1 capsule (75 mg total) by mouth daily with breakfast. 90 capsule 1   No current facility-administered medications on file prior to visit.    Past Surgical History:  Procedure Laterality Date   ABDOMINAL HYSTERECTOMY  03/04/2012   Procedure: HYSTERECTOMY ABDOMINAL;  Surgeon: Truman Corona, MD;  Location: [facility];  Service: Gynecology;  Laterality: N/A;  with lysis of adhesions   BASAL CELL CARCINOMA EXCISION Left    cheek/face   CESAREAN SECTION  2002   HELLP Syndrome/ one time   COLONOSCOPY     SAME TIME AS ENDO  DILATION AND CURETTAGE OF UTERUS  2007   Miscarriage   FINGER SURGERY     2023 x 2 (ORIF and removal)   HAND SURGERY Right    RING FINGER BROKEN 09/2021   LAPAROSCOPIC LYSIS OF ADHESIONS N/A 02/17/2019   Procedure: LAPAROSCOPIC LYSIS OF ADHESIONS;  Surgeon: Eloy Herring, MD;  Location: [facility];  Service: Gynecology;  Laterality: N/A;   LAPAROSCOPIC TUBAL LIGATION  01/03/2011   Procedure: LAPAROSCOPIC TUBAL LIGATION;  Surgeon: Truman Corona;  Location: [facility];  Service: Gynecology;  Laterality: Bilateral;  with filshie clips   LAPAROSCOPY  03/04/2012   Procedure: LAPAROSCOPY DIAGNOSTIC;  Surgeon: Truman Corona, MD;   Location: [facility];  Service: Gynecology;  Laterality: N/A;  with lysis of adhesions   POLYPECTOMY     ROBOTIC ASSISTED BILATERAL SALPINGO OOPHERECTOMY Bilateral 02/17/2019   Procedure: XI ROBOTIC ASSISTED BILATERAL SALPINGO OOPHORECTOMY;  Surgeon: Eloy Herring, MD;  Location: [facility];  Service: Gynecology;  Laterality: Bilateral;   SHOULDER ARTHROSCOPY  2012   right   SVT ABLATION N/A 12/17/2018   Procedure: SVT ABLATION;  Surgeon: Waddell Danelle ORN, MD;  Location: [facility];  Service: Cardiovascular;  Laterality: N/A;   TONSILLECTOMY     TOTAL HIP ARTHROPLASTY Right    FEB 2021   TOTAL HIP REVISION Right 06/03/2023   Procedure: TOTAL HIP REVISION;  Surgeon: Edna Toribio LABOR, MD;  Location: [facility];  Service: Orthopedics;  Laterality: Right;   UPPER GI ENDOSCOPY      Allergies  Allergen Reactions   Sulfamethoxazole-Trimethoprim Nausea And Vomiting and Other (See Comments)    headache    BP 124/86   Ht 5' 5 (1.651 m)   Wt 205 lb (93 kg)   LMP 12/15/2010   BMI 34.11 kg/m       No data to display              No data to display              Objective:  Physical Exam:  Gen: NAD, comfortable in exam room  Location: Bilateral hips - Inspection: No visible edema or overlying skin changes - Palpation: TTP over bilateral greater trochanters, no TTP along anterior hip or posterior hip - ROM: Full ROM at hips bilaterally with mild lateral pain at terminal IR/ER - Strength: left hip with 4-/5 strength, right hip with 4+/5 strength, strength limited by pain bilaterally - Special Tests: Negative FABER/FADIR of bilateral hips, negative straight leg raise bilaterally, negative logroll bilaterally, positive left Trendelenburg, negative Trendelenburg on the right - Neurovascular: Normal sensation to light touch over bilateral distal LLE and 2+ DP pulses bilaterally   Assessment and Plan:   Bilateral greater trochanteric pain syndrome (Lt>Rt) Physical exam consistent  with trochanteric pain syndrome.  Do note positive left Trendelenburg suggesting left gluteus medius tendinopathy while right is strong.  Suspect some historical compensation of left stabilizers during duration of right hip issues. - tx options discussed, would benefit from trial of cortisone injection to bilateral greater trochanteric area.  This was completed today as noted below - would also benefit from hip strengthening.  HEP provided.  She will discuss further with Dr Orlando at upcoming visit next week as well - heat or ice prn - OTC NSAIDs prn - f/u 6-8 weeks if no improvement, sooner prn.  If no improvement would consider imaging.  PROCEDURE:  Risks & benefits of bilateral greater trochanteric cortisone injection reviewed. Consent obtained. Time-out completed. Patient prepped and draped  in the normal fashion. Area cleansed with chlorhexidine . Ethyl chloride spray used to anesthetize the skin. Solution of 7 mL 1% lidocaine  without epinephrine  with 1 mL methylprednisolone (Depo-medrol) 80mg /mL injected into each of the bilateral greater trochanteric bursa using a 22-gauge 3.5-inch needle via the lateral approach over point of maximal tenderness. Patient tolerated procedure well without any complications. Area covered with adhesive bandage. Post-procedure care reviewed, all questions answered.

## 2024-05-07 NOTE — Patient Instructions (Signed)

## 2024-07-09 ENCOUNTER — Encounter: Payer: Self-pay | Admitting: Family Medicine

## 2024-07-09 ENCOUNTER — Ambulatory Visit: Admitting: Family Medicine

## 2024-07-09 VITALS — BP 130/86 | Ht 65.0 in | Wt 205.0 lb

## 2024-07-09 DIAGNOSIS — Z96641 Presence of right artificial hip joint: Secondary | ICD-10-CM | POA: Diagnosis not present

## 2024-07-09 DIAGNOSIS — M25552 Pain in left hip: Secondary | ICD-10-CM

## 2024-07-09 DIAGNOSIS — M25551 Pain in right hip: Secondary | ICD-10-CM | POA: Diagnosis not present

## 2024-07-09 MED ORDER — DICLOFENAC SODIUM 75 MG PO TBEC: 75.0000 mg | DELAYED_RELEASE_TABLET | Freq: Two times a day (BID) | ORAL | 0 refills | Status: AC

## 2024-07-09 NOTE — Progress Notes (Signed)
 DATE OF VISIT: 07/09/2024        Ebony [name] DOB: Feb 10, 1966 MRN: [id number]  Discussed the use of AI scribe software for clinical note transcription with the patient, who gave verbal consent to proceed.  History of Present Illness Tierra E Stan Kathie is a 59 year old female with prior right hip arthroplasty who presents with persistent bilateral hip pain following recent hip injections.  Bilateral Hip Pain: - Persistent bilateral hip pain following bilateral hip injections performed two months ago by me 05/07/2024. - Delayed onset of improvement after injections with only transient relief; pain subsequently recurred - Pain is constant, present throughout the day, and worsens at night and during periods of rest - Prolonged sitting, such as after a recent 12-hour drive, significantly exacerbates symptoms - Pain radiates down the lateral aspect of both hips to the thigh region, without extension below the knees - No numbness or paresthesia - Both hips are currently affected equally  Functional Impact and Activity Modification: - Continues daily home exercise therapy, including clamshells and monster walks with a resistance band - Incorporates additional exercises during thrice-weekly gym sessions - Exercises are now easier to perform with some improvement in muscle endurance, though pain persists - No in-person follow-up with chiropractor Dr. Venetia Ellen since last visit; has communicated via text  Analgesic Use: - Takes ibuprofen  800 mg, which provides temporary relief; symptoms recur when medication wears off - Did not use ibuprofen  prior to last visit - Previously used topical diclofenac  but has not tried the oral formulation  Surgical/hip history: 2021 - R hip replacement with Dr Ebony [name] 2022 - R hip dislocation 2024 - Hip revision with Dr Ebony [name]   Medications:  Outpatient Encounter Medications as of 07/09/2024  Medication Sig   diclofenac  (VOLTAREN ) 75 MG EC  tablet Take 1 tablet (75 mg total) by mouth 2 (two) times daily.   Ascorbic Acid (VITAMIN C) 1000 MG tablet Take 1,000 mg by mouth every evening.   hydrOXYzine  (ATARAX ) 10 MG tablet Take 1 tablet (10 mg total) by mouth at bedtime.   levothyroxine  (SYNTHROID ) 75 MCG tablet TAKE 1 TABLET BY MOUTH EVERY DAY BEFORE BREAKFAST   pantoprazole  (PROTONIX ) 20 MG tablet Take 1 tablet (20 mg total) by mouth daily.   SUMAtriptan  (IMITREX ) 50 MG tablet Take 1 tablet (50 mg total) by mouth every 2 (two) hours as needed for migraine or headache (max of 2 tabs in 24 hours). May repeat in 2 hours if headache persists or recurs.   venlafaxine  XR (EFFEXOR -XR) 75 MG 24 hr capsule Take 1 capsule (75 mg total) by mouth daily with breakfast.   No facility-administered encounter medications on file as of 07/09/2024.    Allergies: is allergic to sulfamethoxazole-trimethoprim.  Physical Examination: Vitals: BP 130/86   Ht 5' 5 (1.651 m)   Wt 205 lb (93 kg)   LMP 12/15/2010   BMI 34.11 kg/m  GENERAL:  Ebony [name] is a 59 y.o. female appearing their stated age, alert and oriented x 3, in no apparent distress.  SKIN: no rashes or lesions, skin clean, dry, intact MSK: Bilateral hips without any gross deformity.  Tender to palpation over greater trochanteric area bilaterally.  No increased redness or warmth.  Full range of motion of hips bilaterally with mild pain at terminal internal and external rotation.  Negative FABER and FADIR bilaterally.  Hip strength 5-/5 bilaterally.  Walking without a limp.  Negative straight leg raise bilaterally. NEURO: sensation intact to light touch, DTR  2/4 Achilles and patella bilaterally VASC: pulses 2+ and symmetric lower extremity bilaterally, no edema  Radiology: Right hip and pelvis x-ray 06/03/2023 personally reviewed and interpreted by me today showing: - Right total hip replacement with good alignment.  No hardware abnormalities appreciated. - Scattered phleboliths in the  pelvis. - Mild degenerative changes of the left hip. - Air and edema noted in the soft tissues consistent with postoperative.  Following right total hip replacement  Assessment & Plan Greater trochanteric pain syndrome of both hips Chronic pain with persistent symptoms despite prior corticosteroid injections. Concern for persistent inflammation, deeper soft tissue irritation, or muscle/tendon tearing. Further imaging needed to clarify etiology and guide management. - Ordered MRI of the pelvis to evaluate soft tissue pathology and assess inflammation or structural abnormalities.  Would like MRI completed at [facility] for convenience.  She is temporarily staying in that area before returning back to [place]  in approximately 2 weeks.  Will be back in [place] in mid March. - Planned to use prior x-rays if permitted by insurance; will obtain updated x-rays if required. - Prescribed oral diclofenac  (Voltaren ) 75 mg p.o. twice daily as needed for improved pain control and convenience.  Should take with food.  Should not take any other NSAIDs with this. - Advised continuation of home exercise program as tolerated, using pain as a guide. - Planned to follow up with results by phone or message if she returns to [place]  before results are available, with in-person follow-up available in March.  Pending results could consider repeat injections, formal PT, other interventions, or possible orthopedist referral.  History of right hip replacement - Reviewed right hip arthroplasty history and prior post-operative imaging.     Patient expressed understanding & agreement with above.  Encounter Diagnoses  Name Primary?   Greater trochanteric pain syndrome of both lower extremities Yes   History of right hip replacement    Presence of right artificial hip joint     Orders Placed This Encounter  Procedures   MR PELVIS WO CONTRAST     Contains text generated by [employer].

## 2024-07-09 NOTE — Patient Instructions (Signed)
 [facility] at [facility] [address], [place], [place] [address] Phone: [phone]  Once we get the MRI approved we will contact you. You can then call to schedule the MRI. We will call you with results once available.
